# Patient Record
Sex: Female | Born: 1975 | Race: White | Hispanic: No | Smoking: Former smoker
Health system: Southern US, Community
[De-identification: ages and names within clinical notes are randomized; demographics above are authoritative.]

## PROBLEM LIST (undated history)

## (undated) ENCOUNTER — Emergency Department (HOSPITAL_COMMUNITY): Admission: EM | Payer: Medicaid Other | Source: Home / Self Care

## (undated) DIAGNOSIS — N390 Urinary tract infection, site not specified: Secondary | ICD-10-CM

## (undated) DIAGNOSIS — D219 Benign neoplasm of connective and other soft tissue, unspecified: Secondary | ICD-10-CM

## (undated) DIAGNOSIS — D649 Anemia, unspecified: Secondary | ICD-10-CM

## (undated) DIAGNOSIS — R519 Headache, unspecified: Secondary | ICD-10-CM

## (undated) DIAGNOSIS — K2950 Unspecified chronic gastritis without bleeding: Secondary | ICD-10-CM

## (undated) DIAGNOSIS — R51 Headache: Secondary | ICD-10-CM

## (undated) DIAGNOSIS — K219 Gastro-esophageal reflux disease without esophagitis: Secondary | ICD-10-CM

## (undated) DIAGNOSIS — D509 Iron deficiency anemia, unspecified: Secondary | ICD-10-CM

## (undated) DIAGNOSIS — K222 Esophageal obstruction: Secondary | ICD-10-CM

## (undated) DIAGNOSIS — N83209 Unspecified ovarian cyst, unspecified side: Secondary | ICD-10-CM

## (undated) HISTORY — DX: Benign neoplasm of connective and other soft tissue, unspecified: D21.9

## (undated) HISTORY — DX: Gastro-esophageal reflux disease without esophagitis: K21.9

## (undated) HISTORY — DX: Iron deficiency anemia, unspecified: D50.9

## (undated) HISTORY — DX: Headache: R51

## (undated) HISTORY — DX: Esophageal obstruction: K22.2

## (undated) HISTORY — DX: Unspecified chronic gastritis without bleeding: K29.50

## (undated) HISTORY — DX: Unspecified ovarian cyst, unspecified side: N83.209

## (undated) HISTORY — DX: Headache, unspecified: R51.9

## (undated) HISTORY — DX: Anemia, unspecified: D64.9

## (undated) HISTORY — DX: Urinary tract infection, site not specified: N39.0

---

## 1995-12-31 HISTORY — PX: TUBAL LIGATION: SHX77

## 2001-05-18 ENCOUNTER — Other Ambulatory Visit: Admission: RE | Admit: 2001-05-18 | Discharge: 2001-05-18 | Payer: Self-pay | Admitting: Family Medicine

## 2001-06-02 ENCOUNTER — Encounter: Payer: Self-pay | Admitting: Family Medicine

## 2001-06-02 ENCOUNTER — Ambulatory Visit (HOSPITAL_COMMUNITY): Admission: RE | Admit: 2001-06-02 | Discharge: 2001-06-02 | Payer: Self-pay | Admitting: Family Medicine

## 2001-09-07 ENCOUNTER — Encounter: Payer: Self-pay | Admitting: Family Medicine

## 2001-09-07 ENCOUNTER — Ambulatory Visit (HOSPITAL_COMMUNITY): Admission: RE | Admit: 2001-09-07 | Discharge: 2001-09-07 | Payer: Self-pay | Admitting: Family Medicine

## 2002-07-01 ENCOUNTER — Other Ambulatory Visit: Admission: RE | Admit: 2002-07-01 | Discharge: 2002-07-01 | Payer: Self-pay | Admitting: Family Medicine

## 2003-11-22 ENCOUNTER — Other Ambulatory Visit: Admission: RE | Admit: 2003-11-22 | Discharge: 2003-11-22 | Payer: Self-pay | Admitting: Family Medicine

## 2005-02-14 ENCOUNTER — Other Ambulatory Visit: Admission: RE | Admit: 2005-02-14 | Discharge: 2005-02-14 | Payer: Self-pay | Admitting: Family Medicine

## 2005-02-26 ENCOUNTER — Ambulatory Visit (HOSPITAL_COMMUNITY): Admission: RE | Admit: 2005-02-26 | Discharge: 2005-02-26 | Payer: Self-pay | Admitting: Family Medicine

## 2007-03-17 ENCOUNTER — Other Ambulatory Visit: Admission: RE | Admit: 2007-03-17 | Discharge: 2007-03-17 | Payer: Self-pay | Admitting: Family Medicine

## 2009-09-16 ENCOUNTER — Emergency Department (HOSPITAL_COMMUNITY): Admission: EM | Admit: 2009-09-16 | Discharge: 2009-09-16 | Payer: Self-pay | Admitting: Emergency Medicine

## 2010-09-04 ENCOUNTER — Emergency Department (HOSPITAL_COMMUNITY): Admission: EM | Admit: 2010-09-04 | Discharge: 2010-09-04 | Payer: Self-pay | Admitting: Emergency Medicine

## 2011-04-05 LAB — URINALYSIS, ROUTINE W REFLEX MICROSCOPIC
Nitrite: NEGATIVE
Protein, ur: 30 mg/dL — AB
pH: 5.5 (ref 5.0–8.0)

## 2011-04-05 LAB — BASIC METABOLIC PANEL
BUN: 7 mg/dL (ref 6–23)
Calcium: 9.8 mg/dL (ref 8.4–10.5)
GFR calc non Af Amer: >60 mL/min (ref >60)
Glucose, Bld: 91 mg/dL (ref 70–99)

## 2011-04-05 LAB — CBC: HCT: 36.9 % (ref 36.0–46.0)

## 2011-04-05 LAB — DIFFERENTIAL
Eosinophils Absolute: 0.1 10*3/uL (ref 0.0–0.7)
Lymphs Abs: 2 10*3/uL (ref 0.7–4.0)
Monocytes Absolute: 0.5 10*3/uL (ref 0.1–1.0)
Monocytes Relative: 7 % (ref 3–12)
Neutrophils Relative %: 66 % (ref 43–77)

## 2011-04-05 LAB — GC/CHLAMYDIA PROBE AMP, GENITAL
Chlamydia, DNA Probe: NEGATIVE
GC Probe Amp, Genital: NEGATIVE

## 2011-04-05 LAB — PREGNANCY, URINE: Preg Test, Ur: NEGATIVE

## 2011-04-05 LAB — WET PREP, GENITAL

## 2011-04-05 LAB — URINE MICROSCOPIC-ADD ON

## 2013-07-09 ENCOUNTER — Ambulatory Visit (INDEPENDENT_AMBULATORY_CARE_PROVIDER_SITE_OTHER): Payer: Medicaid Other | Admitting: Family Medicine

## 2013-07-09 ENCOUNTER — Encounter: Payer: Self-pay | Admitting: Family Medicine

## 2013-07-09 VITALS — BP 113/78 | HR 78 | Temp 97.3°F | Ht 63.0 in | Wt 117.0 lb

## 2013-07-09 DIAGNOSIS — G43909 Migraine, unspecified, not intractable, without status migrainosus: Secondary | ICD-10-CM

## 2013-07-09 MED ORDER — PROMETHAZINE HCL 25 MG/ML IJ SOLN
25.0000 mg | Freq: Once | INTRAMUSCULAR | Status: AC
  Administered 2013-07-09: 25 mg via INTRAMUSCULAR

## 2013-07-09 MED ORDER — PROMETHAZINE HCL 25 MG PO TABS: 25.0000 mg | ORAL_TABLET | Freq: Four times a day (QID) | ORAL | Status: DC | PRN

## 2013-07-09 MED ORDER — DEXAMETHASONE SODIUM PHOSPHATE 4 MG/ML IJ SOLN
8.0000 mg | Freq: Once | INTRAMUSCULAR | Status: AC
  Administered 2013-07-09: 8 mg via INTRAMUSCULAR

## 2013-07-09 MED ORDER — NORETHINDRONE-ETH ESTRADIOL 1-35 MG-MCG PO TABS: 1.0000 | ORAL_TABLET | Freq: Every day | ORAL | Status: DC

## 2013-07-09 MED ORDER — DEXAMETHASONE SODIUM PHOSPHATE 10 MG/ML IJ SOLN: 10.0000 mg | Freq: Once | INTRAMUSCULAR | Status: DC
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
## 2013-07-09 NOTE — Patient Instructions (Addendum)
Promethazine injection What is this medicine? PROMETHAZINE (proe METH a zeen) is an antihistamine. It is used to treat allergic reactions and to treat or prevent nausea and vomiting from illness or motion sickness. It is also used to make you sleep before surgery, and to help treat pain or nausea after surgery. This medicine may be used for other purposes; ask your health care provider or pharmacist if you have questions. What should I tell my health care provider before I take this medicine? They need to know if you have any of these conditions: -glaucoma -high blood pressure or heart disease -kidney disease -liver disease -lung or breathing disease, like asthma -prostate trouble -pain or difficulty passing urine -seizures -an unusual or allergic reaction to promethazine or phenothiazines, other medicines, foods, dyes, or preservatives -pregnant or trying to get pregnant -breast-feeding How should I use this medicine? This medicine is for injection into a muscle, or into a vein. It is given by a health care professional in a hospital or clinic setting. Talk to your pediatrician regarding the use of this medicine in children. This medicine should not be given to infants and children younger than 67 years old. Overdosage: If you think you have taken too much of this medicine contact a poison control center or emergency room at once. NOTE: This medicine is only for you. Do not share this medicine with others. What if I miss a dose? This does not apply. What may interact with this medicine? Do not take this medicine with any of the following medications: -medicines called MAO Inhibitors like Nardil, Parnate, Marplan, Eldepryl -other phenothiazines like trimethobenzamide This medicine may also interact with the following medications: -barbiturates like phenobarbital -bromocriptine -certain antidepressants -certain antihistamines used in allergy or cold  medicines -epinephrine -levodopa -medicines for sleep -medicines for mental problems and psychotic disturbances -medicines for movement abnormalities as in Parkinson's disease, or for gastrointestinal problems -muscle relaxants -prescription pain medicines This list may not describe all possible interactions. Give your health care provider a list of all the medicines, herbs, non-prescription drugs, or dietary supplements you use. Also tell them if you smoke, drink alcohol, or use illegal drugs. Some items may interact with your medicine. What should I watch for while using this medicine? Your condition will be monitored carefully while you are receiving this medicine. Your healthcare professional will discuss with you the risks and the benefits of using this medicine. This medicine has caused serious side effects in some patients after it was injected into a vein. Watch closely for any signs or symptoms of a local reaction like burning, pain, redness, swelling, and blistering and tell your healthcare professional immediately if any occur. These symptoms may occur when you receive the injection or may occur hours or even days after the injection. You may get drowsy or dizzy. Do not drive, use machinery, or do anything that needs mental alertness until you know how this medicine affects you. To reduce the risk of dizzy or fainting spells, do not stand or sit up quickly, especially if you are an older patient. Alcohol may increase dizziness and drowsiness. Avoid alcoholic drinks. Your mouth may get dry. Chewing sugarless gum or sucking hard candy, and drinking plenty of water will help. This medicine may cause dry eyes and blurred vision. If you wear contact lenses you may feel some discomfort. Lubricating drops may help. See your eye doctor if the problem does not go away or is severe. Keep out of the sun, or wear protective clothing  outdoors and use a sunscreen. Do not use sun lamps or sun tanning beds  or booths. If you are diabetic, check your blood-sugar levels regularly. What side effects may I notice from receiving this medicine? Side effects that you should report to your doctor or health care professional as soon as possible: -allergic reactions like skin rash, itching or hives, swelling of the face, lips, or tongue -blurred vision -burning, blistering, pain, redness, and/or swelling at the injection site -irregular heartbeat, palpitations or chest pain -muscle or facial twitches -pain or difficulty passing urine -seizures -slowed or shallow breathing -unusual bleeding or bruising -yellowing of the eyes or skin Side effects that usually do not require medical attention (report to your doctor or health care professional if they continue or are bothersome): -headache -nightmares, agitation, nervousness, excitability, not able to sleep (these are more likely in children) -stuffy nose This list may not describe all possible side effects. Call your doctor for medical advice about side effects. You may report side effects to FDA at 1-800-FDA-1088. Where should I keep my medicine? This drug is given in a hospital or clinic and will not be stored at home. NOTE: This sheet is a summary. It may not cover all possible information. If you have questions about this medicine, talk to your doctor, pharmacist, or health care provider.  2013, Elsevier/Gold Standard. (09/14/2008 4:23:31 PM)   Migraine Headache A migraine headache is an intense, throbbing pain on one or both sides of your head. A migraine can last for 30 minutes to several hours. CAUSES  The exact cause of a migraine headache is not always known. However, a migraine may be caused when nerves in the brain become irritated and release chemicals that cause inflammation. This causes pain. SYMPTOMS  Pain on one or both sides of your head.  Pulsating or throbbing pain.  Severe pain that prevents daily activities.  Pain that is  aggravated by any physical activity.  Nausea, vomiting, or both.  Dizziness.  Pain with exposure to bright lights, loud noises, or activity.  General sensitivity to bright lights, loud noises, or smells. Before you get a migraine, you may get warning signs that a migraine is coming (aura). An aura may include:  Seeing flashing lights.  Seeing bright spots, halos, or zig-zag lines.  Having tunnel vision or blurred vision.  Having feelings of numbness or tingling.  Having trouble talking.  Having muscle weakness. MIGRAINE TRIGGERS  Alcohol.  Smoking.  Stress.  Menstruation.  Aged cheeses.  Foods or drinks that contain nitrates, glutamate, aspartame, or tyramine.  Lack of sleep.  Chocolate.  Caffeine.  Hunger.  Physical exertion.  Fatigue.  Medicines used to treat chest pain (nitroglycerine), birth control pills, estrogen, and some blood pressure medicines. DIAGNOSIS  A migraine headache is often diagnosed based on:  Symptoms.  Physical examination.  A CT scan or MRI of your head. TREATMENT Medicines may be given for pain and nausea. Medicines can also be given to help prevent recurrent migraines.  HOME CARE INSTRUCTIONS  Only take over-the-counter or prescription medicines for pain or discomfort as directed by your caregiver. The use of long-term narcotics is not recommended.  Lie down in a dark, quiet room when you have a migraine.  Keep a journal to find out what may trigger your migraine headaches. For example, write down:  What you eat and drink.  How much sleep you get.  Any change to your diet or medicines.  Limit alcohol consumption.  Quit smoking if  you smoke.  Get 7 to 9 hours of sleep, or as recommended by your caregiver.  Limit stress.  Keep lights dim if bright lights bother you and make your migraines worse. SEEK IMMEDIATE MEDICAL CARE IF:   Your migraine becomes severe.  You have a fever.  You have a stiff  neck.  You have vision loss.  You have muscular weakness or loss of muscle control.  You start losing your balance or have trouble walking.  You feel faint or pass out.  You have severe symptoms that are different from your first symptoms. MAKE SURE YOU:   Understand these instructions.  Will watch your condition.  Will get help right away if you are not doing well or get worse. Document Released: 12/16/2005 Document Revised: 03/09/2012 Document Reviewed: 12/06/2011 [facility] Patient Information 2014 [place], [place].

## 2013-12-06 ENCOUNTER — Encounter: Payer: Self-pay | Admitting: Family Medicine

## 2013-12-06 ENCOUNTER — Ambulatory Visit (INDEPENDENT_AMBULATORY_CARE_PROVIDER_SITE_OTHER): Payer: Medicaid Other | Admitting: Family Medicine

## 2013-12-06 VITALS — BP 114/80 | HR 79 | Temp 98.5°F | Ht 63.0 in | Wt 122.0 lb

## 2013-12-06 DIAGNOSIS — G43909 Migraine, unspecified, not intractable, without status migrainosus: Secondary | ICD-10-CM

## 2013-12-06 DIAGNOSIS — R51 Headache: Secondary | ICD-10-CM

## 2013-12-06 MED ORDER — SUMATRIPTAN SUCCINATE 25 MG PO TABS: 25.0000 mg | ORAL_TABLET | ORAL | Status: DC | PRN

## 2013-12-06 MED ORDER — NORETHINDRONE-ETH ESTRADIOL 1-35 MG-MCG PO TABS: 1.0000 | ORAL_TABLET | Freq: Every day | ORAL | Status: DC

## 2013-12-06 MED ORDER — METHYLPREDNISOLONE ACETATE 80 MG/ML IJ SUSP: 80.0000 mg | Freq: Once | INTRAMUSCULAR | Status: DC

## 2013-12-06 MED ORDER — PROMETHAZINE HCL 25 MG/ML IJ SOLN
25.0000 mg | Freq: Once | INTRAMUSCULAR | Status: AC
  Administered 2013-12-06: 25 mg via INTRAMUSCULAR

## 2013-12-06 MED ORDER — DEXAMETHASONE SODIUM PHOSPHATE 4 MG/ML IJ SOLN
8.0000 mg | Freq: Once | INTRAMUSCULAR | Status: AC
  Administered 2013-12-06: 8 mg via INTRAMUSCULAR
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
## 2013-12-06 NOTE — Addendum Note (Signed)
Addended by: Gwenith Daily on: 12/06/2013 02:16 PM   Modules accepted: Orders

## 2013-12-16 ENCOUNTER — Telehealth: Payer: Self-pay | Admitting: Family Medicine

## 2013-12-21 NOTE — Telephone Encounter (Signed)
Continued headache. Results of CT are not in the EMR. Requested report from [facility]. Appt scheduled for f/u with Dr. Alvester Morin tomorrow.

## 2013-12-22 ENCOUNTER — Encounter: Payer: Self-pay | Admitting: Family Medicine

## 2013-12-22 ENCOUNTER — Ambulatory Visit (INDEPENDENT_AMBULATORY_CARE_PROVIDER_SITE_OTHER): Payer: Medicaid Other | Admitting: Family Medicine

## 2013-12-22 VITALS — BP 112/76 | HR 97 | Temp 97.3°F | Ht 63.0 in | Wt 122.4 lb

## 2013-12-22 DIAGNOSIS — G43909 Migraine, unspecified, not intractable, without status migrainosus: Secondary | ICD-10-CM

## 2013-12-22 NOTE — Progress Notes (Signed)
SUBJECTIVE: Laurie Howell is a 37 y.o. female who complains of headaches for several days has been for similar headache flares in the past. Most recently about 2 months ago. Headache seems to be much more intense in comparison to previous. Was given IM decadron and phenergan with last migrainous flare. Pt states that this was minimally effective. Description of pain: throbbing pain, sharp pain, global, bilateral in the frontal area. Duration of individual headaches: hours   frequency daily. Associated symptoms: aura, dizziness, light sensitivity and nausea. Pain relief: unable to obtain relief with OTC meds. Precipitating factors: stress. She denies a history of recent head injury.  Prior neurological history: negative for migraine headaches. Note: had head CT w/in last 30 days that was WNL.  Neurologic Review of Systems - no TIA or stroke-like symptoms.  Scheduled Meds: Continuous Infusions: PRN Meds:     OBJECTIVE: Appearance: in mild distress 2/2 pain . HEENT: NCAT, EOMI, TMs clear bilaterally CV: RRR, no murmurs auscultated PULM: CTAB, no wheezes, rales, rhoncii ABD: S/NT/+ bowel sounds  EXT: 2+ peripheral pulses Neurological Exam: alert, oriented, normal speech, no focal findings or movement disorder noted, + photophobia bilaterally. Significant. .  ASSESSMENT/PLAN: Migraines - Plan: Ambulatory referral to Neurology   Had a relatively lengthy discussion with pt.  Both the pt and I feel she would benefit from ER evaluation of HA as she may need IV migraine cocktail with observation +/- admission. Intensity of HA seems to be worse than previous.  Recent neuroimaging WNL.  Pt overall clinicall stable. No focla neurological abnormalities on exam.  Feels like she can go to ER under her own power. Has a driver.  Still needs neuro follow up.  Consider start pt on migraine ppx medication ( topamax, amitriptyline, propanolol) pending follow up.  Follow up as needed.

## 2014-01-03 ENCOUNTER — Ambulatory Visit (INDEPENDENT_AMBULATORY_CARE_PROVIDER_SITE_OTHER): Payer: Medicaid Other | Admitting: Family Medicine

## 2014-01-03 VITALS — BP 112/70 | HR 94 | Temp 97.7°F | Ht 63.0 in | Wt 121.0 lb

## 2014-01-03 DIAGNOSIS — G43909 Migraine, unspecified, not intractable, without status migrainosus: Secondary | ICD-10-CM

## 2014-01-03 MED ORDER — TOPIRAMATE 50 MG PO TABS: 50.0000 mg | ORAL_TABLET | Freq: Every day | ORAL | Status: DC

## 2014-01-03 NOTE — Progress Notes (Signed)
Pt not seen by MD  Called in topamax for migraine ppx as previously discussed at last visit (12/24) Neuro referral also placed ( also discussed at last visit).

## 2014-01-13 ENCOUNTER — Encounter (INDEPENDENT_AMBULATORY_CARE_PROVIDER_SITE_OTHER): Payer: Self-pay

## 2014-01-13 ENCOUNTER — Ambulatory Visit (INDEPENDENT_AMBULATORY_CARE_PROVIDER_SITE_OTHER): Payer: Medicaid Other | Admitting: Neurology

## 2014-01-13 ENCOUNTER — Encounter: Payer: Self-pay | Admitting: Neurology

## 2014-01-13 VITALS — BP 127/82 | HR 88 | Ht 64.0 in | Wt 120.0 lb

## 2014-01-13 DIAGNOSIS — G4440 Drug-induced headache, not elsewhere classified, not intractable: Secondary | ICD-10-CM

## 2014-01-13 DIAGNOSIS — T3995XA Adverse effect of unspecified nonopioid analgesic, antipyretic and antirheumatic, initial encounter: Secondary | ICD-10-CM

## 2014-01-13 DIAGNOSIS — R519 Headache, unspecified: Secondary | ICD-10-CM

## 2014-01-13 DIAGNOSIS — R51 Headache: Secondary | ICD-10-CM

## 2014-01-13 DIAGNOSIS — G43019 Migraine without aura, intractable, without status migrainosus: Secondary | ICD-10-CM

## 2014-01-13 DIAGNOSIS — G43909 Migraine, unspecified, not intractable, without status migrainosus: Secondary | ICD-10-CM

## 2014-01-13 MED ORDER — ELETRIPTAN HYDROBROMIDE 40 MG PO TABS: 40.0000 mg | ORAL_TABLET | ORAL | Status: DC | PRN

## 2014-01-13 MED ORDER — TOPIRAMATE 50 MG PO TABS: 50.0000 mg | ORAL_TABLET | Freq: Two times a day (BID) | ORAL | Status: DC

## 2014-01-13 NOTE — Patient Instructions (Addendum)
I had a long discussion the patient with regards to her migraine headaches progressively getting worse and discuss tenderness rebound and advise her to discontinue Tylenol and/or over-the-counter analgesics. I gave her samples of Relpax and advise her to take 40 mg tablet for symptomatic relief and may repeat a second dose in 2 hours if suboptimal relief but not more than 2 tablets a day and not more than 2 days per week. Increase Topamax to 50 mg twice daily. I encouraged her to participate in daily activities for stress laxation like regular exercise, swimming, meditation and yoga. She was also advised to do that daily next exercises. She was also advised to discuss with her gynecologist switching her birth control pill to one with minimum to no estrogen. Check MRI scan of the brain. Keep a headache diary. Return for followup in 2 months with Laurie Hawking, NP for call earlier if necessary.

## 2014-01-14 DIAGNOSIS — G4440 Drug-induced headache, not elsewhere classified, not intractable: Secondary | ICD-10-CM | POA: Insufficient documentation

## 2014-01-14 DIAGNOSIS — T3995XA Adverse effect of unspecified nonopioid analgesic, antipyretic and antirheumatic, initial encounter: Secondary | ICD-10-CM

## 2014-01-14 DIAGNOSIS — R519 Headache, unspecified: Secondary | ICD-10-CM | POA: Insufficient documentation

## 2014-01-14 DIAGNOSIS — G43019 Migraine without aura, intractable, without status migrainosus: Secondary | ICD-10-CM | POA: Insufficient documentation

## 2014-01-14 DIAGNOSIS — R51 Headache: Secondary | ICD-10-CM

## 2014-01-14 NOTE — Progress Notes (Signed)
[facility] [address] [place]. [place] [address] [phone]       OFFICE CONSULT NOTE  [name] Date of Birth:  14-Mar-1976 Medical Record Number:  [id number]   Referring MD:  Dr Laurance Flatten  Reason for Referral:  headaches  HPI: 5 year Caucasian lady with migraine headaches since last 4 years which have been gradually worsening in frequency and severity to now daily  Headaches for last 6 months.She describes the headaches as holocephalic, severe 41/32 seviety, stabbing accompanied by nausea, frequent vomitting, light and sound sensitivity, dizziness, vision difficulties and worsened with physical activity, bending down and relieved by sleep, lying down.Headaches are often present upon awakening and may last all day and she has trouble sleeping on most nights. She denies any focal neurological symptom with her headaches, she also has muscle tightnessi n her neck with her headaches and posterior neck pain. She does not drink excessive caffeine. She takes 4-6 tylenol tablets daily for symptomatic relief.advil and goody powders do not help. She has not tried any triptans or migraine specific medicines. She has been on topamax 50 mg daily for several months without benefit.She was sen in [facility] 12/22/13 and treated withh injectable pain medicines with partial relief only. She has not had brain MRI. CT she has family h/o migraines in her mother. head 12/09/13 personally reviewed was normal.  ROS:   14 system review of systems is positive for trouble swallowing, blurred vision,double vision,eye pain,easy bruising, headache, weakness,difficulty swallowing, ,dizziness and all other systems negative  PMH:  Past Medical History  Diagnosis Date  . Esophageal stricture   . Headache   . Anemia     Social History:  History   Social History  . Marital Status: Legally Separated    Spouse Name: N/A    Number of Children: 2  . Years of Education: 8th   Occupational  History  . N/A    Social History Main Topics  . Smoking status: Former Smoker    Types: Cigarettes    Quit date: 12/30/1994  . Smokeless tobacco: Never Used  . Alcohol Use: No  . Drug Use: No  . Sexual Activity: Not on file   Other Topics Concern  . Not on file   Social History Narrative  . No narrative on file    Medications:   Current Outpatient Prescriptions on File Prior to Visit  Medication Sig Dispense Refill  . diphenhydrAMINE (BENADRYL) 50 MG tablet Take 50 mg by mouth 2 (two) times daily.      . metoCLOPramide (REGLAN) 10 MG tablet Take 10 mg by mouth 2 (two) times daily.      . norethindrone-ethinyl estradiol 1/35 (ALAYCEN 1/35) tablet Take 1 tablet by mouth daily.  1 Package  2   No current facility-administered medications on file prior to visit.    Allergies:   Allergies  Allergen Reactions  . Aspirin Swelling  . Ivp Dye [Iodinated Diagnostic Agents]     Physical Exam General: well developed, well nourished, seated, in no evident distress Head: head normocephalic and atraumatic. Orohparynx benign Neck: supple with no carotid or supraclavicular bruits Cardiovascular: regular rate and rhythm, no murmurs Musculoskeletal: no deformity Skin:  no rash/petichiae Vascular:  Normal pulses all extremities Filed Vitals:   01/13/14 1259  BP: 127/82  Pulse: 88    Neurologic Exam Mental Status: Awake and fully alert. Oriented to place and time. Recent and remote memory intact. Attention span, concentration and fund of knowledge  appropriate. Mood and affect appropriate.  Cranial Nerves: Fundoscopic exam reveals sharp disc margins. Pupils equal, briskly reactive to light. Extraocular movements full without nystagmus. Visual fields full to confrontation. Hearing intact. Facial sensation intact. Face, tongue, palate moves normally and symmetrically.  Motor: Normal bulk and tone. Normal strength in all tested extremity muscles. Sensory.: intact to tough and pinprick  and vibratory.  Coordination: Rapid alternating movements normal in all extremities. Finger-to-nose and heel-to-shin performed accurately bilaterally. Gait and Station: Arises from chair without difficulty. Stance is normal. Gait demonstrates normal stride length and balance . Able to heel, toe and tandem walk without difficulty.  Reflexes: 1+ and symmetric. Toes downgoing.       ASSESSMENT: 28 year Caucasian lady transformed migraine headaches with analgesic rebound    PLAN:I had a long discussion the patient with regards to her migraine headaches progressively getting worse and discuss tenderness rebound and advise her to discontinue Tylenol and/or over-the-counter analgesics. I gave her samples of Relpax and advise her to take 40 mg tablet for symptomatic relief and may repeat a second dose in 2 hours if suboptimal relief but not more than 2 tablets a day and not more than 2 days per week. Increase Topamax to 50 mg twice daily. I encouraged her to participate in daily activities for stress relaxation like regular exercise, swimming, meditation and yoga. She was also advised to do that daily next exercises. She was also advised to discuss with her gynecologist switching her birth control pill to one with minimum to no estrogen. Check MRI scan of the brain. Keep a headache diary. Return for followup in 2 months with Jeani Hawking, NP for call earlier if necessary.

## 2014-01-26 ENCOUNTER — Inpatient Hospital Stay: Admission: RE | Admit: 2014-01-26 | Payer: Self-pay | Source: Ambulatory Visit

## 2014-02-03 ENCOUNTER — Other Ambulatory Visit: Payer: Self-pay | Admitting: Family Medicine

## 2014-02-04 ENCOUNTER — Inpatient Hospital Stay: Admission: RE | Admit: 2014-02-04 | Payer: Self-pay | Source: Ambulatory Visit

## 2014-02-07 NOTE — Telephone Encounter (Signed)
Seen 01/03/14  Dr Ernestina Patches

## 2014-03-16 ENCOUNTER — Ambulatory Visit: Payer: Medicaid Other | Admitting: Nurse Practitioner

## 2014-04-13 ENCOUNTER — Other Ambulatory Visit: Payer: Self-pay | Admitting: Family Medicine

## 2014-06-19 ENCOUNTER — Other Ambulatory Visit: Payer: Self-pay | Admitting: Family Medicine

## 2014-07-11 ENCOUNTER — Other Ambulatory Visit: Payer: Self-pay | Admitting: Family Medicine

## 2014-07-11 NOTE — Telephone Encounter (Signed)
Patient was notified at last refill that NTBS. Please advise

## 2014-07-11 NOTE — Telephone Encounter (Signed)
No more refills without being seen 

## 2014-08-02 ENCOUNTER — Other Ambulatory Visit: Payer: Self-pay | Admitting: Family Medicine

## 2014-10-06 ENCOUNTER — Other Ambulatory Visit: Payer: Self-pay | Admitting: Nurse Practitioner

## 2014-10-07 ENCOUNTER — Telehealth: Payer: Self-pay | Admitting: Family Medicine

## 2014-10-07 MED ORDER — NORETHINDRONE-ETH ESTRADIOL 1-35 MG-MCG PO TABS: ORAL_TABLET | ORAL | Status: DC

## 2014-10-07 NOTE — Telephone Encounter (Signed)
Patient notified of RX sent to pharmacy and informed her that she would need to be seen for pap to continue RX.

## 2014-10-07 NOTE — Telephone Encounter (Signed)
I sent the Rx to the pharmacy.- ntbs no pap since 2013 Discussed with patient

## 2015-02-05 ENCOUNTER — Encounter (HOSPITAL_COMMUNITY): Payer: Self-pay | Admitting: *Deleted

## 2015-02-05 ENCOUNTER — Emergency Department (HOSPITAL_COMMUNITY)
Admission: EM | Admit: 2015-02-05 | Discharge: 2015-02-06 | Disposition: A | Discharge status: Home / Self Care | Payer: Medicaid Other | Attending: Emergency Medicine | Admitting: Emergency Medicine

## 2015-02-05 DIAGNOSIS — Z793 Long term (current) use of hormonal contraceptives: Secondary | ICD-10-CM | POA: Diagnosis not present

## 2015-02-05 DIAGNOSIS — Z8619 Personal history of other infectious and parasitic diseases: Secondary | ICD-10-CM | POA: Diagnosis not present

## 2015-02-05 DIAGNOSIS — Z872 Personal history of diseases of the skin and subcutaneous tissue: Secondary | ICD-10-CM | POA: Diagnosis not present

## 2015-02-05 DIAGNOSIS — R112 Nausea with vomiting, unspecified: Secondary | ICD-10-CM | POA: Insufficient documentation

## 2015-02-05 DIAGNOSIS — Z789 Other specified health status: Secondary | ICD-10-CM

## 2015-02-05 DIAGNOSIS — Z87891 Personal history of nicotine dependence: Secondary | ICD-10-CM | POA: Diagnosis not present

## 2015-02-05 DIAGNOSIS — Z79899 Other long term (current) drug therapy: Secondary | ICD-10-CM | POA: Diagnosis not present

## 2015-02-05 DIAGNOSIS — F1010 Alcohol abuse, uncomplicated: Secondary | ICD-10-CM | POA: Insufficient documentation

## 2015-02-05 DIAGNOSIS — Z8719 Personal history of other diseases of the digestive system: Secondary | ICD-10-CM | POA: Insufficient documentation

## 2015-02-05 NOTE — ED Notes (Signed)
Pt arrived by EMS from home. Reported pt has been drinking & got sick just as EMS arrived. Pt got zofran by EMS. Pt complaining of being cold.

## 2015-02-06 LAB — RAPID URINE DRUG SCREEN, HOSP PERFORMED
Amphetamines: NOT DETECTED
Barbiturates: NOT DETECTED
Benzodiazepines: NOT DETECTED
Cocaine: NOT DETECTED
OPIATES: NOT DETECTED
TETRAHYDROCANNABINOL: NOT DETECTED

## 2015-02-06 LAB — URINALYSIS, ROUTINE W REFLEX MICROSCOPIC
Bilirubin Urine: NEGATIVE
Glucose, UA: NEGATIVE mg/dL
Ketones, ur: NEGATIVE mg/dL
Nitrite: NEGATIVE
Protein, ur: NEGATIVE mg/dL
UROBILINOGEN UA: 0.2 mg/dL (ref 0.0–1.0)
pH: 6.5 (ref 5.0–8.0)

## 2015-02-06 LAB — CBC WITH DIFFERENTIAL/PLATELET
BASOS ABS: 0 10*3/uL (ref 0.0–0.1)
Basophils Relative: 1 % (ref 0–1)
EOS ABS: 0.1 10*3/uL (ref 0.0–0.7)
Eosinophils Relative: 2 % (ref 0–5)
HEMATOCRIT: 35 % — AB (ref 36.0–46.0)
Hemoglobin: 11.6 g/dL — ABNORMAL LOW (ref 12.0–15.0)
LYMPHS ABS: 2.7 10*3/uL (ref 0.7–4.0)
Lymphocytes Relative: 34 % (ref 12–46)
MCH: 30.2 pg (ref 26.0–34.0)
MCHC: 33.1 g/dL (ref 30.0–36.0)
MCV: 91.1 fL (ref 78.0–100.0)
MONO ABS: 0.7 10*3/uL (ref 0.1–1.0)
MONOS PCT: 9 % (ref 3–12)
NEUTROS PCT: 54 % (ref 43–77)
Neutro Abs: 4.3 10*3/uL (ref 1.7–7.7)
PLATELETS: 262 10*3/uL (ref 150–400)
RBC: 3.84 MIL/uL — ABNORMAL LOW (ref 3.87–5.11)
RDW: 12.5 % (ref 11.5–15.5)
WBC: 7.9 10*3/uL (ref 4.0–10.5)

## 2015-02-06 LAB — BASIC METABOLIC PANEL
Anion gap: 8 (ref 5–15)
BUN: 10 mg/dL (ref 6–23)
CHLORIDE: 119 mmol/L — AB (ref 96–112)
CO2: 18 mmol/L — ABNORMAL LOW (ref 19–32)
Calcium: 8.2 mg/dL — ABNORMAL LOW (ref 8.4–10.5)
Creatinine, Ser: 0.55 mg/dL (ref 0.50–1.10)
GFR calc Af Amer: >90 mL/min (ref >90)
Glucose, Bld: 102 mg/dL — ABNORMAL HIGH (ref 70–99)
POTASSIUM: 3.2 mmol/L — AB (ref 3.5–5.1)
Sodium: 145 mmol/L (ref 135–145)

## 2015-02-06 LAB — PROTIME-INR
INR: 1.17 (ref 0.00–1.49)
PROTHROMBIN TIME: 15 s (ref 11.6–15.2)

## 2015-02-06 LAB — URINE MICROSCOPIC-ADD ON

## 2015-02-06 LAB — ETHANOL: ALCOHOL ETHYL (B): 171 mg/dL — AB (ref 0–9)

## 2015-02-06 MED ORDER — ONDANSETRON HCL 4 MG/2ML IJ SOLN
4.0000 mg | Freq: Once | INTRAMUSCULAR | Status: AC
  Administered 2015-02-06: 4 mg via INTRAVENOUS

## 2015-02-06 MED ORDER — POTASSIUM CHLORIDE 10 MEQ/100ML IV SOLN
10.0000 meq | Freq: Once | INTRAVENOUS | Status: AC
  Administered 2015-02-06: 10 meq via INTRAVENOUS
  Filled 2015-02-06: qty 100

## 2015-02-06 MED ORDER — SODIUM CHLORIDE 0.9 % IV BOLUS (SEPSIS)
1000.0000 mL | Freq: Once | INTRAVENOUS | Status: AC
  Administered 2015-02-06: 1000 mL via INTRAVENOUS

## 2015-02-06 MED ORDER — DIAZEPAM 5 MG PO TABS
ORAL_TABLET | ORAL | Status: AC
  Filled 2015-02-06: qty 1

## 2015-02-06 MED ORDER — ONDANSETRON HCL 4 MG/2ML IJ SOLN
INTRAMUSCULAR | Status: AC
  Filled 2015-02-06: qty 2

## 2015-02-06 NOTE — ED Notes (Signed)
Discharge instructions given, pt demonstrated teach back and verbal understanding. No concerns voiced.  

## 2015-02-06 NOTE — ED Notes (Signed)
Patient ambulatory to restroom with spouse at this time

## 2015-02-06 NOTE — ED Notes (Addendum)
Pt c/o pain and burning at IV site from K+ infusion. Checked for patency, +blood return and flushed well. K+ rate decreased to 51ml per hour with ns IVF infusion flowing with K+. EDP notifed.

## 2015-02-06 NOTE — Discharge Instructions (Signed)

## 2015-02-09 NOTE — ED Provider Notes (Signed)
CSN: [id number]     Arrival date & time 02/05/15  2355 History   First MD Initiated Contact with Patient 02/06/15 0000     Chief Complaint  Patient presents with  . Emesis     (Consider location/radiation/quality/duration/timing/severity/associated sxs/prior Treatment) HPI   38yf with etoh during the Super Bowl. Husband is at bedside. He states that patient was drinking heavily. "By the second quarter she had to keep asking for the score because she couldn't make it out on the TV anymore." Increasingly drowsy from there. Vomited several times. He was present with patient. No trauma. She was in usual state of health earlierin day.Patient with no complaints aside from feeling nauseated and cold.  Past Medical History  Diagnosis Date  . Esophageal stricture   . Headache   . Anemia    Past Surgical History  Procedure Laterality Date  . Tubal ligation     Family History  Problem Relation Age of Onset  . Depression Mother   . Hypertension Mother   . Heart disease Father   . Hypertension Father   . Anxiety disorder Father   . Heart attack Father    History  Substance Use Topics  . Smoking status: Former Smoker    Types: Cigarettes    Quit date: 12/30/1994  . Smokeless tobacco: Never Used  . Alcohol Use: Yes   OB History    No data available     Review of Systems  All systems reviewed and negative, other than as noted in HPI.   Allergies  Aspirin and Ivp dye  Home Medications   Prior to Admission medications   Medication Sig Start Date End Date Taking? Authorizing Provider  diphenhydrAMINE (BENADRYL) 50 MG tablet Take 50 mg by mouth 2 (two) times daily.    Historical Provider, MD  eletriptan (RELPAX) 40 MG tablet Take 1 tablet (40 mg total) by mouth as needed for migraine or headache. One tablet by mouth at onset of headache. May repeat in 2 hours if headache persists or recurs. 01/13/14   Antony Contras, MD  metoCLOPramide (REGLAN) 10 MG tablet Take 10 mg by mouth 2  (two) times daily.    Historical Provider, MD  norethindrone-ethinyl estradiol 1/35 (ALAYCEN 1/35) tablet TAKE 1 TABLET BY MOUTH DAILY. 10/07/14   Mary-Margaret Hassell Done, FNP  topiramate (TOPAMAX) 50 MG tablet Take 1 tablet (50 mg total) by mouth 2 (two) times daily. 01/13/14   Antony Contras, MD   BP 142/128 mmHg  Pulse 106  Temp(Src) 97.6 F (36.4 C) (Oral)  Resp 28  Ht 5\' 2"  (1.575 m)  Wt 98 lb (44.453 kg)  BMI 17.92 kg/m2  SpO2 100% Physical Exam  Constitutional: She appears well-developed and well-nourished.  HENT:  Head: Normocephalic and atraumatic.  Eyes: Pupils are equal, round, and reactive to light. Right eye exhibits no discharge. Left eye exhibits no discharge.  Conjunctival injection.   Neck: Neck supple.  Cardiovascular: Normal rate, regular rhythm and normal heart sounds.  Exam reveals no gallop and no friction rub.   No murmur heard. Pulmonary/Chest: Effort normal and breath sounds normal. No respiratory distress.  Abdominal: Soft. She exhibits no distension. There is no tenderness.  Musculoskeletal: She exhibits no edema or tenderness.  Neurological:  Very drowsy. Opens eyes/responds too voice. Follows commands. no focal motor deficit.  Skin: Skin is warm and dry.  Psychiatric: She has a normal mood and affect. Her behavior is normal. Thought content normal.  Nursing note and vitals reviewed.  ED Course  Procedures (including critical care time) Labs Review Labs Reviewed  CBC WITH DIFFERENTIAL/PLATELET - Abnormal; Notable for the following:    RBC 3.84 (*)    Hemoglobin 11.6 (*)    HCT 35.0 (*)    All other components within normal limits  BASIC METABOLIC PANEL - Abnormal; Notable for the following:    Potassium 3.2 (*)    Chloride 119 (*)    CO2 18 (*)    Glucose, Bld 102 (*)    Calcium 8.2 (*)    All other components within normal limits  ETHANOL - Abnormal; Notable for the following:    Alcohol, Ethyl (B) 171 (*)    All other components within normal  limits  URINALYSIS, ROUTINE W REFLEX MICROSCOPIC - Abnormal; Notable for the following:    Specific Gravity, Urine <1.005 (*)    Hgb urine dipstick SMALL (*)    Leukocytes, UA SMALL (*)    All other components within normal limits  URINE MICROSCOPIC-ADD ON - Abnormal; Notable for the following:    Bacteria, UA FEW (*)    All other components within normal limits  PROTIME-INR  URINE RAPID DRUG SCREEN (HOSP PERFORMED)    Imaging Review No results found.   EKG Interpretation None      MDM   Final diagnoses:  Alcohol ingestion  Non-intractable vomiting with nausea, vomiting of unspecified type    38yF with heavy drinking during superbowl and subsequently vomiting/drowsy. No reported trauma. Exam/history consistent with acute etoh ingestion. Feeling better at this point. Husband feels comfortable with taking her home.I feel she is stale for DC.     Virgel Manifold, MD 02/09/15 1248

## 2015-02-28 HISTORY — PX: OTHER SURGICAL HISTORY: SHX169

## 2015-05-01 ENCOUNTER — Ambulatory Visit (INDEPENDENT_AMBULATORY_CARE_PROVIDER_SITE_OTHER): Payer: Medicaid Other | Admitting: Obstetrics & Gynecology

## 2015-05-01 ENCOUNTER — Encounter: Payer: Self-pay | Admitting: Obstetrics & Gynecology

## 2015-05-01 VITALS — BP 107/75 | HR 74 | Resp 16 | Ht 62.0 in | Wt 115.0 lb

## 2015-05-01 DIAGNOSIS — N971 Female infertility of tubal origin: Secondary | ICD-10-CM | POA: Diagnosis not present

## 2015-05-01 HISTORY — DX: Female infertility of tubal origin: N97.1

## 2015-05-01 NOTE — Progress Notes (Signed)
Patient ID: Laurie Howell, female   DOB: Nov 19, 1976, 39 y.o.   MRN: [id number]  Chief Complaint  Patient presents with  . Gynecologic Exam    Pt had tubal reversal 02/2014 and has been using ovulation kits at home showing no ovulation.     HPI Laurie Howell is a 39 y.o. female.  [id number] Patient's last menstrual period was 04/19/2015 (approximate). S/p tubal reanastomosis  [facility]/[place] [facility] Ovulation indicator negative, Patient's last menstrual period was 04/19/2015 (approximate). Regular menses   Past Medical History  Diagnosis Date  . Esophageal stricture   . Headache   . Anemia   . Frequent UTI     Past Surgical History  Procedure Laterality Date  . Tubal ligation  1997  . Tubal reversal  02/2015    [facility] - Dr Karie Kirks    Family History  Problem Relation Age of Onset  . Depression Mother   . Hypertension Mother   . Heart disease Father   . Hypertension Father   . Anxiety disorder Father   . Heart attack Father     Social History History  Substance Use Topics  . Smoking status: Former Smoker    Types: Cigarettes    Quit date: 12/30/1994  . Smokeless tobacco: Never Used  . Alcohol Use: No    Allergies  Allergen Reactions  . Aspirin Swelling  . Ivp Dye [Iodinated Diagnostic Agents]     No current outpatient prescriptions on file.   No current facility-administered medications for this visit.    Review of Systems Review of Systems  Constitutional: Negative.   Genitourinary: Negative.   Psychiatric/Behavioral: Negative.     Blood pressure 107/75, pulse 74, resp. rate 16, height 5\' 2"  (1.575 m), weight 52.164 kg (115 lb), last menstrual period 04/19/2015.  Physical Exam Physical Exam  Constitutional: She is oriented to person, place, and time. She appears well-developed. No distress.  Pulmonary/Chest: Effort normal.  Neurological: She is alert and oriented to person, place, and time.  Psychiatric: She has a normal mood and affect. Her behavior  is normal.    Data Reviewed   Assessment    S/p tubal reversal surgery seeking infertility advice     Plan    Recommend basal body temperature recording, time intercourse, notify Dr Karie Kirks if unable to conceive 6 months        ARNOLD,JAMES 05/01/2015, 9:36 AM

## 2015-05-01 NOTE — Patient Instructions (Signed)
Infertility WHAT IS INFERTILITY?  Infertility is usually defined as not being able to get pregnant after trying for one year of regular sexual intercourse without the use of contraceptives. Or not being able to carry a pregnancy to term and have a baby. The infertility rate in the [place] is around 10%. Pregnancy is the result of a chain of events. A woman must release an egg from one of her ovaries (ovulation). The egg must be fertilized by the female sperm. Then it travels through a fallopian tube into the uterus (womb), where it attaches to the wall of the uterus and grows. A man must have enough sperm, and the sperm must join with (fertilize) the egg along the way, at the proper time. The fertilized egg must then become attached to the inside of the uterus. While this may seem simple, many things can happen to prevent pregnancy from occurring.  WHOSE PROBLEM IS IT?  About 20% of infertility cases are due to problems with the man (female factors) and 65% are due to problems with the woman (female factors). Other cases are due to a combination of female and female factors or to unknown causes.  WHAT CAUSES INFERTILITY IN MEN?  Infertility in men is often caused by problems with making enough normal sperm or getting the sperm to reach the egg. Problems with sperm may exist from birth or develop later in life, due to illness or injury. Some men produce no sperm, or produce too few sperm (oligospermia). Other problems include:  Sexual dysfunction.  Hormonal or endocrine problems.  Age. Female fertility decreases with age, but not at as young an age as female fertility.  Infection.  Congenital problems. Birth defect, such as absence of the tubes that carry the sperm (vas deferens).  Genetic/chromosomal problems.  Antisperm antibody problems.  Retrograde ejaculation (sperm go into the bladder).  Varicoceles, spematoceles, or tumors of the testicles.  Lifestyle can influence the number and  quality of a man's sperm.  Alcohol and drugs can temporarily reduce sperm quality.  Environmental toxins, including pesticides and lead, may cause some cases of infertility in men. WHAT CAUSES INFERTILITY IN WOMEN?   Problems with ovulation account for most infertility in women. Without ovulation, eggs are not available to be fertilized.  Signs of problems with ovulation include irregular menstrual periods or no periods at all.  Simple lifestyle factors, including stress, diet, or athletic training, can affect a woman's hormonal balance.  Age. Fertility begins to decrease in women in the early 76s and is worse after age 71.  Much less often, a hormonal imbalance from a serious medical problem, such as a pituitary gland tumor, thyroid or other chronic medical disease, can cause ovulation problems.  Pelvic infections.  Polycystic ovary syndrome (increase in female hormones, unable to ovulate).  Alcohol or illegal drugs.  Environmental toxins, radiation, pesticides, and certain chemicals.  Aging is an important factor in female infertility.  The ability of a woman's ovaries to produce eggs declines with age, especially after age 27. About one third of couples where the woman is over 21 will have problems with fertility.  By the time she reaches menopause when her monthly periods stop for good, a woman can no longer produce eggs or become pregnant.  Other problems can also lead to infertility in women. If the fallopian tubes are blocked at one or both ends, the egg cannot travel through the tubes into the uterus. Scar tissue (adhesions) in the pelvis may cause blocked  tubes. This may result from pelvic inflammatory disease, endometriosis, or surgery for an ectopic pregnancy (fertilized egg implanted outside the uterus) or any pelvic or abdominal surgery causing adhesions.  Fibroid tumors or polyps of the uterus.  Congenital (birth defect) abnormalities of the uterus.  Infection of the  cervix (cervicitis).  Cervical stenosis (narrowing).  Abnormal cervical mucus.  Polycystic ovary syndrome.  Having sexual intercourse too often (every other day or 4 to 5 times a week).  Obesity.  Anorexia.  Poor nutrition.  Over exercising, with loss of body fat.  DES. Your mother received diethylstilbesterol hormone when pregnant with you. HOW IS INFERTILITY TESTED?  If you have been trying to have a baby without success, you may want to seek medical help. You should not wait for one year of trying before seeing a health care provider if:  You are over 35.  You have reason to believe that there may be a fertility problem. A medical evaluation may determine the reasons for a couple's infertility. Usually this process begins with:  Physical exams.  Medical histories of both partners.  Sexual histories of both partners. If there is no obvious problem, like improperly timed intercourse or absence of ovulation, tests may be needed.   For a man, testing usually begins with tests of his semen to look at:  The number of sperm.  The shape of sperm.  Movement of his sperm.  Taking a complete medical and surgical history.  Physical examination.  Check for infection of the female reproductive organs. Sometimes hormone tests are done.   For a woman, the first step in testing is to find out if she is ovulating each month. There are several ways to do this. For example, she can keep track of changes in her morning body temperature and in the texture of her cervical mucus. Another tool is a home ovulation test kit, which can be bought at drug or grocery stores.  Checks of ovulation can also be done in the doctor's office, using blood tests for hormone levels or ultrasound tests of the ovaries. If the woman is ovulating, more tests will need to be done. Some common female tests include:  Hysterosalpingogram: An x-ray of the fallopian tubes and uterus after they are injected with  dye. It shows if the tubes are open and shows the shape of the uterus.  Laparoscopy: An exam of the tubes and other female organs for disease. A lighted tube called a laparoscope is used to see inside the abdomen.  Endometrial biopsy: Sample of uterus tissue taken on the first day of the menstrual period, to see if the tissue indicates you are ovulating.  Transvaginal ultrasound: Examines the female organs.  Hysteroscopy: Uses a lighted tube to examine the cervix and inside the uterus, to see if there are any abnormalities inside the uterus. TREATMENT  Depending on the test results, different treatments can be suggested. The type of treatment depends on the cause. 85 to 90% of infertility cases are treated with drugs or surgery.   Various fertility drugs may be used for women with ovulation problems. It is important to talk with your caregiver about the drug to be used. You should understand the drug's benefits and side effects. Depending on the type of fertility drug and the dosage of the drug used, multiple births (twins or multiples) can occur in some women.  If needed, surgery can be done to repair damage to a woman's ovaries, fallopian tubes, cervix, or uterus.  Surgery  or medical treatment for endometriosis or polycystic ovary syndrome. Sometimes a man has an infertility problem that can be corrected with medicine or by surgery.  Intrauterine insemination (IUI) of sperm, timed with ovulation.  Change in lifestyle, if that is the cause (lose weight, increase exercise, and stop smoking, drinking excessively, or taking illegal drugs).  Other types of surgery:  Removing growths inside and on the uterus.  Removing scar tissue from inside of the uterus.  Fixing blocked tubes.  Removing scar tissue in the pelvis and around the female organs. WHAT IS ASSISTED REPRODUCTIVE TECHNOLOGY (ART)?  Assisted reproductive technology (ART) is another form of special methods used to help infertile  couples. ART involves handling both the woman's eggs and the man's sperm. Success rates vary and depend on many factors. ART can be expensive and time-consuming. But ART has made it possible for many couples to have children that otherwise would not have been conceived. Some methods are listed below:  In vitro fertilization (IVF). IVF is often used when a woman's fallopian tubes are blocked or when a man has low sperm counts. A drug is used to stimulate the ovaries to produce multiple eggs. Once mature, the eggs are removed and placed in a culture dish with the man's sperm for fertilization. After about 40 hours, the eggs are examined to see if they have become fertilized by the sperm and are dividing into cells. These fertilized eggs (embryos) are then placed in the woman's uterus. This bypasses the fallopian tubes.  Gamete intrafallopian transfer (GIFT) is similar to IVF, but used when the woman has at least one normal fallopian tube. Three to five eggs are placed in the fallopian tube, along with the man's sperm, for fertilization inside the woman's body.  Zygote intrafallopian transfer (ZIFT), also called tubal embryo transfer, combines IVF and GIFT. The eggs retrieved from the woman's ovaries are fertilized in the lab and placed in the fallopian tubes rather than in the uterus.  ART procedures sometimes involve the use of donor eggs (eggs from another woman) or previously frozen embryos. Donor eggs may be used if a woman has impaired ovaries or has a genetic disease that could be passed on to her baby.  When performing ART, you are at higher risk for resulting in multiple pregnancies, twins, triplets or more.  Intracytoplasma sperm injection is a procedure that injects a single sperm into the egg to fertilize it.  Embryo transplant is a procedure that starts after growing an embryo in a special media (chemical solution) developed to keep the embryo alive for 2 to 5 days, and then transplanting it  into the uterus. In cases where a cause cannot be found and pregnancy does not occur, adoption may be a consideration. Document Released: 12/19/2003 Document Revised: 03/09/2012 Document Reviewed: 11/14/2009 [facility] Patient Information 2015 [place], [place]. This information is not intended to replace advice given to you by your health care provider. Make sure you discuss any questions you have with your health care provider.

## 2015-07-19 ENCOUNTER — Encounter: Payer: Self-pay | Admitting: Physician Assistant

## 2015-07-19 ENCOUNTER — Ambulatory Visit (INDEPENDENT_AMBULATORY_CARE_PROVIDER_SITE_OTHER): Payer: Medicaid Other | Admitting: Physician Assistant

## 2015-07-19 VITALS — BP 109/74 | HR 85 | Temp 97.2°F | Wt 113.2 lb

## 2015-07-19 DIAGNOSIS — L0291 Cutaneous abscess, unspecified: Secondary | ICD-10-CM | POA: Diagnosis not present

## 2015-07-19 DIAGNOSIS — L0390 Cellulitis, unspecified: Secondary | ICD-10-CM

## 2015-07-19 MED ORDER — CEPHALEXIN 500 MG PO CAPS: 500.0000 mg | ORAL_CAPSULE | Freq: Two times a day (BID) | ORAL | Status: DC

## 2015-07-19 NOTE — Patient Instructions (Signed)

## 2015-07-19 NOTE — Progress Notes (Signed)
Subjective:     Patient ID: Laurie Howell, female   DOB: 03-21-1976, 39 y.o.   MRN: [id number]  HPI Pt with pain to the L axilla No known injury Denies any drainage from area No OTC meds for sx  Review of Systems  Constitutional: Negative.   Musculoskeletal: Positive for myalgias.  Skin: Positive for color change.       Objective:   Physical Exam + erythema and induration to the L axilla No definite area of fluct + TTP of same No drainage or ulceration seen    Assessment:     Cellulitis    Plan:     Warm compresses Avoid manipulation Keflex bid x 10 days Informed this may need I&D and to F/u if sx cont

## 2015-07-20 ENCOUNTER — Emergency Department (HOSPITAL_COMMUNITY)
Admission: EM | Admit: 2015-07-20 | Discharge: 2015-07-21 | Disposition: A | Discharge status: Home / Self Care | Payer: Medicaid Other | Attending: Emergency Medicine | Admitting: Emergency Medicine

## 2015-07-20 ENCOUNTER — Encounter (HOSPITAL_COMMUNITY): Payer: Self-pay | Admitting: *Deleted

## 2015-07-20 DIAGNOSIS — Z8719 Personal history of other diseases of the digestive system: Secondary | ICD-10-CM | POA: Insufficient documentation

## 2015-07-20 DIAGNOSIS — Z87440 Personal history of urinary (tract) infections: Secondary | ICD-10-CM | POA: Insufficient documentation

## 2015-07-20 DIAGNOSIS — Z862 Personal history of diseases of the blood and blood-forming organs and certain disorders involving the immune mechanism: Secondary | ICD-10-CM | POA: Diagnosis not present

## 2015-07-20 DIAGNOSIS — Z792 Long term (current) use of antibiotics: Secondary | ICD-10-CM | POA: Insufficient documentation

## 2015-07-20 DIAGNOSIS — Z87891 Personal history of nicotine dependence: Secondary | ICD-10-CM | POA: Diagnosis not present

## 2015-07-20 DIAGNOSIS — L02412 Cutaneous abscess of left axilla: Secondary | ICD-10-CM | POA: Diagnosis present

## 2015-07-20 MED ORDER — SULFAMETHOXAZOLE-TRIMETHOPRIM 800-160 MG PO TABS: 1.0000 | ORAL_TABLET | Freq: Two times a day (BID) | ORAL | Status: AC

## 2015-07-20 MED ORDER — SULFAMETHOXAZOLE-TRIMETHOPRIM 800-160 MG PO TABS
1.0000 | ORAL_TABLET | Freq: Once | ORAL | Status: AC
  Administered 2015-07-20: 1 via ORAL
  Filled 2015-07-20: qty 1

## 2015-07-20 MED ORDER — LIDOCAINE HCL (PF) 1 % IJ SOLN
INTRAMUSCULAR | Status: AC
  Filled 2015-07-20: qty 5

## 2015-07-20 MED ORDER — HYDROCODONE-ACETAMINOPHEN 5-325 MG PO TABS
1.0000 | ORAL_TABLET | Freq: Once | ORAL | Status: AC
  Administered 2015-07-20: 1 via ORAL
  Filled 2015-07-20: qty 1

## 2015-07-20 MED ORDER — HYDROCODONE-ACETAMINOPHEN 5-325 MG PO TABS: 1.0000 | ORAL_TABLET | ORAL | Status: DC | PRN

## 2015-07-20 NOTE — ED Notes (Signed)
Pt c/o abscess to left axilla area that started a few days ago,

## 2015-07-20 NOTE — Discharge Instructions (Signed)

## 2015-07-21 NOTE — ED Notes (Signed)
Telfa, 4x4's applied to left axilla area, pt tolerated well,  ]

## 2015-07-22 NOTE — ED Provider Notes (Signed)
CSN: [id number]     Arrival date & time 07/20/15  2243 History   First MD Initiated Contact with Patient 07/20/15 2256     Chief Complaint  Patient presents with  . Abscess     (Consider location/radiation/quality/duration/timing/severity/associated sxs/prior Treatment) Patient is a 39 y.o. female presenting with abscess. The history is provided by the patient.  Abscess Location:  Shoulder/arm Shoulder/arm abscess location:  L axilla Abscess quality: induration, painful and redness   Red streaking: no   Duration:  3 days Progression:  Worsening Pain details:    Quality:  Sharp and throbbing   Severity:  Moderate   Duration:  1 day   Timing:  Constant   Progression:  Worsening Chronicity:  New Context: not diabetes, not immunosuppression, not injected drug use and not skin injury   Relieved by:  Nothing Worsened by:  Nothing tried Ineffective treatments:  Warm compresses Associated symptoms: no fever, no nausea and no vomiting     Past Medical History  Diagnosis Date  . Esophageal stricture   . Headache   . Anemia   . Frequent UTI    Past Surgical History  Procedure Laterality Date  . Tubal ligation  1997  . Tubal reversal  02/2015    [facility] - Dr Karie Kirks   Family History  Problem Relation Age of Onset  . Depression Mother   . Hypertension Mother   . Heart disease Father   . Hypertension Father   . Anxiety disorder Father   . Heart attack Father    History  Substance Use Topics  . Smoking status: Former Smoker    Types: Cigarettes    Quit date: 12/30/1994  . Smokeless tobacco: Never Used  . Alcohol Use: No   OB History    Gravida Para Term Preterm AB TAB SAB Ectopic Multiple Living   6 2 2  4  4   2      Review of Systems  Constitutional: Negative for fever and chills.  Respiratory: Negative for shortness of breath and wheezing.   Gastrointestinal: Negative for nausea and vomiting.  Skin: Positive for color change and wound.  Neurological: Negative  for numbness.      Allergies  Aspirin and Ivp dye  Home Medications   Prior to Admission medications   Medication Sig Start Date End Date Taking? Authorizing Provider  cephALEXin (KEFLEX) 500 MG capsule Take 1 capsule (500 mg total) by mouth 2 (two) times daily. 07/19/15   Lodema Pilot, PA-C  HYDROcodone-acetaminophen (NORCO/VICODIN) 5-325 MG per tablet Take 1 tablet by mouth every 4 (four) hours as needed for moderate pain. 07/20/15   Evalee Jefferson, PA-C  sulfamethoxazole-trimethoprim (BACTRIM DS,SEPTRA DS) 800-160 MG per tablet Take 1 tablet by mouth 2 (two) times daily. 07/20/15 07/27/15  Evalee Jefferson, PA-C   BP 124/88 mmHg  Pulse 106  Temp(Src) 98.2 F (36.8 C) (Oral)  Resp 16  Wt 110 lb (49.896 kg)  SpO2 100%  LMP 06/28/2015 Physical Exam  Constitutional: She appears well-developed and well-nourished. No distress.  HENT:  Head: Normocephalic.  Neck: Neck supple.  Cardiovascular: Normal rate.   Pulmonary/Chest: Effort normal. She has no wheezes.  Musculoskeletal: Normal range of motion. She exhibits no edema.  Skin:  2 cm raised, erythematous induration left axilla. Minimal surrounding erythema, no drainage, no red streaking.    ED Course  Procedures (including critical care time)  INCISION AND DRAINAGE Performed by: Evalee Jefferson Consent: Verbal consent obtained. Risks and benefits: risks, benefits and alternatives  were discussed Type: abscess  Body area: left axilla  Anesthesia: local infiltration  Incision was made with a scalpel.  Local anesthetic: lidocaine  1% without epinephrine  Anesthetic total: 5 ml  Complexity: complex Blunt dissection to break up loculations  Drainage: purulent  Drainage amount: moderate  Packing material: 1/4 in iodoform gauze  Patient tolerance: Patient tolerated the procedure well with no immediate complications.    Labs Review Labs Reviewed - No data to display  Imaging Review No results found.   EKG  Interpretation None      MDM   Final diagnoses:  Abscess of axilla, left    Advised frequent warm compresses to keep the site open and draining.  Prescribed bactrim, hydrocodone. Encouraged recheck here or by pcp for any worsening or persistent sx.  The patient appears reasonably screened and/or stabilized for discharge and I doubt any other medical condition or other [facility] requiring further screening, evaluation, or treatment in the ED at this time prior to discharge.     Evalee Jefferson, PA-C 07/22/15 [place], MD 08/08/15 [phone]

## 2015-08-11 ENCOUNTER — Encounter: Payer: Self-pay | Admitting: Family

## 2015-08-11 ENCOUNTER — Ambulatory Visit (INDEPENDENT_AMBULATORY_CARE_PROVIDER_SITE_OTHER): Payer: Medicaid Other | Admitting: Family

## 2015-08-11 VITALS — BP 115/80 | HR 84 | Temp 97.3°F | Ht 62.0 in | Wt 112.0 lb

## 2015-08-11 DIAGNOSIS — N912 Amenorrhea, unspecified: Secondary | ICD-10-CM | POA: Diagnosis not present

## 2015-08-11 LAB — POCT URINE PREGNANCY: Preg Test, Ur: NEGATIVE

## 2015-08-11 NOTE — Patient Instructions (Signed)
First Trimester of Pregnancy The first trimester of pregnancy is from week 1 until the end of week 12 (months 1 through 3). A week after a sperm fertilizes an egg, the egg will implant on the wall of the uterus. This embryo will begin to develop into a baby. Genes from you and your partner are forming the baby. The female genes determine whether the baby is a boy or a girl. At 6-8 weeks, the eyes and face are formed, and the heartbeat can be seen on ultrasound. At the end of 12 weeks, all the baby's organs are formed.  Now that you are pregnant, you will want to do everything you can to have a healthy baby. Two of the most important things are to get good prenatal care and to follow your health care provider's instructions. Prenatal care is all the medical care you receive before the baby's birth. This care will help prevent, find, and treat any problems during the pregnancy and childbirth. BODY CHANGES Your body goes through many changes during pregnancy. The changes vary from woman to woman.   You may gain or lose a couple of pounds at first.  You may feel sick to your stomach (nauseous) and throw up (vomit). If the vomiting is uncontrollable, call your health care provider.  You may tire easily.  You may develop headaches that can be relieved by medicines approved by your health care provider.  You may urinate more often. Painful urination may mean you have a bladder infection.  You may develop heartburn as a result of your pregnancy.  You may develop constipation because certain hormones are causing the muscles that push waste through your intestines to slow down.  You may develop hemorrhoids or swollen, bulging veins (varicose veins).  Your breasts may begin to grow larger and become tender. Your nipples may stick out more, and the tissue that surrounds them (areola) may become darker.  Your gums may bleed and may be sensitive to brushing and flossing.  Dark spots or blotches (chloasma,  mask of pregnancy) may develop on your face. This will likely fade after the baby is born.  Your menstrual periods will stop.  You may have a loss of appetite.  You may develop cravings for certain kinds of food.  You may have changes in your emotions from day to day, such as being excited to be pregnant or being concerned that something may go wrong with the pregnancy and baby.  You may have more vivid and strange dreams.  You may have changes in your hair. These can include thickening of your hair, rapid growth, and changes in texture. Some women also have hair loss during or after pregnancy, or hair that feels dry or thin. Your hair will most likely return to normal after your baby is born. WHAT TO EXPECT AT YOUR PRENATAL VISITS During a routine prenatal visit:  You will be weighed to make sure you and the baby are growing normally.  Your blood pressure will be taken.  Your abdomen will be measured to track your baby's growth.  The fetal heartbeat will be listened to starting around week 10 or 12 of your pregnancy.  Test results from any previous visits will be discussed. Your health care provider may ask you:  How you are feeling.  If you are feeling the baby move.  If you have had any abnormal symptoms, such as leaking fluid, bleeding, severe headaches, or abdominal cramping.  If you have any questions. Other tests   that may be performed during your first trimester include:  Blood tests to find your blood type and to check for the presence of any previous infections. They will also be used to check for low iron levels (anemia) and Rh antibodies. Later in the pregnancy, blood tests for diabetes will be done along with other tests if problems develop.  Urine tests to check for infections, diabetes, or protein in the urine.  An ultrasound to confirm the proper growth and development of the baby.  An amniocentesis to check for possible genetic problems.  Fetal screens for  spina bifida and Down syndrome.  You may need other tests to make sure you and the baby are doing well. HOME CARE INSTRUCTIONS  Medicines  Follow your health care provider's instructions regarding medicine use. Specific medicines may be either safe or unsafe to take during pregnancy.  Take your prenatal vitamins as directed.  If you develop constipation, try taking a stool softener if your health care provider approves. Diet  Eat regular, well-balanced meals. Choose a variety of foods, such as meat or vegetable-based protein, fish, milk and low-fat dairy products, vegetables, fruits, and whole grain breads and cereals. Your health care provider will help you determine the amount of weight gain that is right for you.  Avoid raw meat and uncooked cheese. These carry germs that can cause birth defects in the baby.  Eating four or five small meals rather than three large meals a day may help relieve nausea and vomiting. If you start to feel nauseous, eating a few soda crackers can be helpful. Drinking liquids between meals instead of during meals also seems to help nausea and vomiting.  If you develop constipation, eat more high-fiber foods, such as fresh vegetables or fruit and whole grains. Drink enough fluids to keep your urine clear or pale yellow. Activity and Exercise  Exercise only as directed by your health care provider. Exercising will help you:  Control your weight.  Stay in shape.  Be prepared for labor and delivery.  Experiencing pain or cramping in the lower abdomen or low back is a good sign that you should stop exercising. Check with your health care provider before continuing normal exercises.  Try to avoid standing for long periods of time. Move your legs often if you must stand in one place for a long time.  Avoid heavy lifting.  Wear low-heeled shoes, and practice good posture.  You may continue to have sex unless your health care provider directs you  otherwise. Relief of Pain or Discomfort  Wear a good support bra for breast tenderness.   Take warm sitz baths to soothe any pain or discomfort caused by hemorrhoids. Use hemorrhoid cream if your health care provider approves.   Rest with your legs elevated if you have leg cramps or low back pain.  If you develop varicose veins in your legs, wear support hose. Elevate your feet for 15 minutes, 3-4 times a day. Limit salt in your diet. Prenatal Care  Schedule your prenatal visits by the twelfth week of pregnancy. They are usually scheduled monthly at first, then more often in the last 2 months before delivery.  Write down your questions. Take them to your prenatal visits.  Keep all your prenatal visits as directed by your health care provider. Safety  Wear your seat belt at all times when driving.  Make a list of emergency phone numbers, including numbers for family, friends, the hospital, and police and fire departments. General Tips    Ask your health care provider for a referral to a local prenatal education class. Begin classes no later than at the beginning of month 6 of your pregnancy.  Ask for help if you have counseling or nutritional needs during pregnancy. Your health care provider can offer advice or refer you to specialists for help with various needs.  Do not use hot tubs, steam rooms, or saunas.  Do not douche or use tampons or scented sanitary pads.  Do not cross your legs for long periods of time.  Avoid cat litter boxes and soil used by cats. These carry germs that can cause birth defects in the baby and possibly loss of the fetus by miscarriage or stillbirth.  Avoid all smoking, herbs, alcohol, and medicines not prescribed by your health care provider. Chemicals in these affect the formation and growth of the baby.  Schedule a dentist appointment. At home, brush your teeth with a soft toothbrush and be gentle when you floss. SEEK MEDICAL CARE IF:   You have  dizziness.  You have mild pelvic cramps, pelvic pressure, or nagging pain in the abdominal area.  You have persistent nausea, vomiting, or diarrhea.  You have a bad smelling vaginal discharge.  You have pain with urination.  You notice increased swelling in your face, hands, legs, or ankles. SEEK IMMEDIATE MEDICAL CARE IF:   You have a fever.  You are leaking fluid from your vagina.  You have spotting or bleeding from your vagina.  You have severe abdominal cramping or pain.  You have rapid weight gain or loss.  You vomit blood or material that looks like coffee grounds.  You are exposed to German measles and have never had them.  You are exposed to fifth disease or chickenpox.  You develop a severe headache.  You have shortness of breath.  You have any kind of trauma, such as from a fall or a car accident. Document Released: 12/10/2001 Document Revised: 05/02/2014 Document Reviewed: 10/26/2013 [employer] Patient Information 2015 [employer]. This information is not intended to replace advice given to you by your health care provider. Make sure you discuss any questions you have with your health care provider.  

## 2015-08-11 NOTE — Progress Notes (Signed)
   Subjective:    Patient ID: Laurie Howell, female    DOB: 1976/12/08, 39 y.o.   MRN: [id number]  HPI Pt presents to the office with amenorrhea. Pt states she is two weeks past her normal menstrual cycle. PT states she has taken one pregnancy test at home that was positive and one that was negative. Pt states she had her "tubes reversed" in March and has been trying to become pregnant since then. Pt states she had to have "blood drawn" to confirm her other two pregnancies. Pt states that was 19 years ago, but her "urine was always negative".    Review of Systems  Constitutional: Negative.   HENT: Negative.   Eyes: Negative.   Respiratory: Negative.  Negative for shortness of breath.   Cardiovascular: Negative.  Negative for palpitations.  Gastrointestinal: Negative.   Endocrine: Negative.   Genitourinary: Negative.   Musculoskeletal: Negative.   Neurological: Negative.  Negative for headaches.  Hematological: Negative.   Psychiatric/Behavioral: Negative.   All other systems reviewed and are negative.      Objective:   Physical Exam  Constitutional: She is oriented to person, place, and time. She appears well-developed and well-nourished. No distress.  HENT:  Head: Normocephalic and atraumatic.  Right Ear: External ear normal.  Left Ear: External ear normal.  Nose: Nose normal.  Mouth/Throat: Oropharynx is clear and moist.  Eyes: Pupils are equal, round, and reactive to light.  Neck: Normal range of motion. Neck supple. No thyromegaly present.  Cardiovascular: Normal rate, regular rhythm, normal heart sounds and intact distal pulses.   No murmur heard. Pulmonary/Chest: Effort normal and breath sounds normal. No respiratory distress. She has no wheezes.  Abdominal: Soft. Bowel sounds are normal. She exhibits no distension. There is no tenderness.  Musculoskeletal: Normal range of motion. She exhibits no edema or tenderness.  Neurological: She is alert and oriented to person,  place, and time. She has normal reflexes. No cranial nerve deficit.  Skin: Skin is warm and dry.  Psychiatric: She has a normal mood and affect. Her behavior is normal. Judgment and thought content normal.  Vitals reviewed.   BP 115/80 mmHg  Pulse 84  Temp(Src) 97.3 F (36.3 C) (Oral)  Ht 5\' 2"  (1.575 m)  Wt 112 lb (50.803 kg)  BMI 20.48 kg/m2  LMP 06/28/2015       Assessment & Plan:  1. Amenorrhea -Encouraged healthy diet  -Avoid alcohol and OTC medications -Will refer to OB if HcG is positive -RTO prn - POCT urine pregnancy - hCG, quantitative, pregnancy  Evelina Dun, FNP

## 2015-08-12 LAB — HCG, SERUM, QUALITATIVE: HCG, BETA SUBUNIT, QUAL, SERUM: NEGATIVE m[IU]/mL (ref <6)

## 2015-08-14 NOTE — Progress Notes (Signed)
Patient aware.

## 2015-11-30 ENCOUNTER — Encounter: Payer: Medicaid Other | Admitting: Obstetrics and Gynecology

## 2015-12-04 ENCOUNTER — Encounter: Payer: Medicaid Other | Admitting: Obstetrics and Gynecology

## 2015-12-04 ENCOUNTER — Telehealth: Payer: Self-pay | Admitting: Family Medicine

## 2016-01-11 ENCOUNTER — Ambulatory Visit (INDEPENDENT_AMBULATORY_CARE_PROVIDER_SITE_OTHER): Payer: Medicaid Other | Admitting: Family

## 2016-01-11 ENCOUNTER — Encounter: Payer: Self-pay | Admitting: Family

## 2016-01-11 VITALS — BP 117/73 | HR 95 | Temp 97.3°F | Ht 62.0 in | Wt 114.6 lb

## 2016-01-11 DIAGNOSIS — G4700 Insomnia, unspecified: Secondary | ICD-10-CM | POA: Diagnosis not present

## 2016-01-11 DIAGNOSIS — N926 Irregular menstruation, unspecified: Secondary | ICD-10-CM | POA: Diagnosis not present

## 2016-01-11 LAB — POCT URINE PREGNANCY: PREG TEST UR: NEGATIVE

## 2016-01-11 MED ORDER — MEDROXYPROGESTERONE ACETATE 150 MG/ML IM SUSP: 150.0000 mg | INTRAMUSCULAR | Status: DC

## 2016-01-11 MED ORDER — MEDROXYPROGESTERONE ACETATE 150 MG/ML IM SUSP
150.0000 mg | INTRAMUSCULAR | Status: DC
  Administered 2016-01-11: 150 mg via INTRAMUSCULAR

## 2016-01-11 MED ORDER — TRAZODONE HCL 50 MG PO TABS: 25.0000 mg | ORAL_TABLET | Freq: Every evening | ORAL | Status: DC | PRN

## 2016-01-11 NOTE — Patient Instructions (Signed)
Medroxyprogesterone injection [Contraceptive] What is this medicine? MEDROXYPROGESTERONE (me DROX ee proe JES te rone) contraceptive injections prevent pregnancy. They provide effective birth control for 3 months. Depo-subQ Provera 104 is also used for treating pain related to endometriosis. This medicine may be used for other purposes; ask your health care provider or pharmacist if you have questions. What should I tell my health care provider before I take this medicine? They need to know if you have any of these conditions: -frequently drink alcohol -asthma -blood vessel disease or a history of a blood clot in the lungs or legs -bone disease such as osteoporosis -breast cancer -diabetes -eating disorder (anorexia nervosa or bulimia) -high blood pressure -HIV infection or AIDS -kidney disease -liver disease -mental depression -migraine -seizures (convulsions) -stroke -tobacco smoker -vaginal bleeding -an unusual or allergic reaction to medroxyprogesterone, other hormones, medicines, foods, dyes, or preservatives -pregnant or trying to get pregnant -breast-feeding How should I use this medicine? Depo-Provera Contraceptive injection is given into a muscle. Depo-subQ Provera 104 injection is given under the skin. These injections are given by a health care professional. You must not be pregnant before getting an injection. The injection is usually given during the first 5 days after the start of a menstrual period or 6 weeks after delivery of a baby. Talk to your pediatrician regarding the use of this medicine in children. Special care may be needed. These injections have been used in female children who have started having menstrual periods. Overdosage: If you think you have taken too much of this medicine contact a poison control center or emergency room at once. NOTE: This medicine is only for you. Do not share this medicine with others. What if I miss a dose? Try not to miss a  dose. You must get an injection once every 3 months to maintain birth control. If you cannot keep an appointment, call and reschedule it. If you wait longer than 13 weeks between Depo-Provera contraceptive injections or longer than 14 weeks between Depo-subQ Provera 104 injections, you could get pregnant. Use another method for birth control if you miss your appointment. You may also need a pregnancy test before receiving another injection. What may interact with this medicine? Do not take this medicine with any of the following medications: -bosentan This medicine may also interact with the following medications: -aminoglutethimide -antibiotics or medicines for infections, especially rifampin, rifabutin, rifapentine, and griseofulvin -aprepitant -barbiturate medicines such as phenobarbital or primidone -bexarotene -carbamazepine -medicines for seizures like ethotoin, felbamate, oxcarbazepine, phenytoin, topiramate -modafinil -St. John's wort This list may not describe all possible interactions. Give your health care provider a list of all the medicines, herbs, non-prescription drugs, or dietary supplements you use. Also tell them if you smoke, drink alcohol, or use illegal drugs. Some items may interact with your medicine. What should I watch for while using this medicine? This drug does not protect you against HIV infection (AIDS) or other sexually transmitted diseases. Use of this product may cause you to lose calcium from your bones. Loss of calcium may cause weak bones (osteoporosis). Only use this product for more than 2 years if other forms of birth control are not right for you. The longer you use this product for birth control the more likely you will be at risk for weak bones. Ask your health care professional how you can keep strong bones. You may have a change in bleeding pattern or irregular periods. Many females stop having periods while taking this drug. If you have  received your  injections on time, your chance of being pregnant is very low. If you think you may be pregnant, see your health care professional as soon as possible. Tell your health care professional if you want to get pregnant within the next year. The effect of this medicine may last a long time after you get your last injection. What side effects may I notice from receiving this medicine? Side effects that you should report to your doctor or health care professional as soon as possible: -allergic reactions like skin rash, itching or hives, swelling of the face, lips, or tongue -breast tenderness or discharge -breathing problems -changes in vision -depression -feeling faint or lightheaded, falls -fever -pain in the abdomen, chest, groin, or leg -problems with balance, talking, walking -unusually weak or tired -yellowing of the eyes or skin Side effects that usually do not require medical attention (report to your doctor or health care professional if they continue or are bothersome): -acne -fluid retention and swelling -headache -irregular periods, spotting, or absent periods -temporary pain, itching, or skin reaction at site where injected -weight gain This list may not describe all possible side effects. Call your doctor for medical advice about side effects. You may report side effects to FDA at 1-800-FDA-1088. Where should I keep my medicine? This does not apply. The injection will be given to you by a health care professional. NOTE: This sheet is a summary. It may not cover all possible information. If you have questions about this medicine, talk to your doctor, pharmacist, or health care provider.    2016, Elsevier/Gold Standard. (2009-01-06 18:37:56) Insomnia Insomnia is a sleep disorder that makes it difficult to fall asleep or to stay asleep. Insomnia can cause tiredness (fatigue), low energy, difficulty concentrating, mood swings, and poor performance at work or school.  There are three  different ways to classify insomnia:  Difficulty falling asleep.  Difficulty staying asleep.  Waking up too early in the morning. Any type of insomnia can be long-term (chronic) or short-term (acute). Both are common. Short-term insomnia usually lasts for three months or less. Chronic insomnia occurs at least three times a week for longer than three months. CAUSES  Insomnia may be caused by another condition, situation, or substance, such as:  Anxiety.  Certain medicines.  Gastroesophageal reflux disease (GERD) or other gastrointestinal conditions.  Asthma or other breathing conditions.  Restless legs syndrome, sleep apnea, or other sleep disorders.  Chronic pain.  Menopause. This may include hot flashes.  Stroke.  Abuse of alcohol, tobacco, or illegal drugs.  Depression.  Caffeine.   Neurological disorders, such as Alzheimer disease.  An overactive thyroid (hyperthyroidism). The cause of insomnia may not be known. RISK FACTORS Risk factors for insomnia include:  Gender. Women are more commonly affected than men.  Age. Insomnia is more common as you get older.  Stress. This may involve your professional or personal life.  Income. Insomnia is more common in people with lower income.  Lack of exercise.   Irregular work schedule or night shifts.  Traveling between different time zones. SIGNS AND SYMPTOMS If you have insomnia, trouble falling asleep or trouble staying asleep is the main symptom. This may lead to other symptoms, such as:  Feeling fatigued.  Feeling nervous about going to sleep.  Not feeling rested in the morning.  Having trouble concentrating.  Feeling irritable, anxious, or depressed. TREATMENT  Treatment for insomnia depends on the cause. If your insomnia is caused by an underlying condition, treatment will  focus on addressing the condition. Treatment may also include:   Medicines to help you sleep.  Counseling or  therapy.  Lifestyle adjustments. HOME CARE INSTRUCTIONS   Take medicines only as directed by your health care provider.  Keep regular sleeping and waking hours. Avoid naps.  Keep a sleep diary to help you and your health care provider figure out what could be causing your insomnia. Include:   When you sleep.  When you wake up during the night.  How well you sleep.   How rested you feel the next day.  Any side effects of medicines you are taking.  What you eat and drink.   Make your bedroom a comfortable place where it is easy to fall asleep:  Put up shades or special blackout curtains to block light from outside.  Use a white noise machine to block noise.  Keep the temperature cool.   Exercise regularly as directed by your health care provider. Avoid exercising right before bedtime.  Use relaxation techniques to manage stress. Ask your health care provider to suggest some techniques that may work well for you. These may include:  Breathing exercises.  Routines to release muscle tension.  Visualizing peaceful scenes.  Cut back on alcohol, caffeinated beverages, and cigarettes, especially close to bedtime. These can disrupt your sleep.  Do not overeat or eat spicy foods right before bedtime. This can lead to digestive discomfort that can make it hard for you to sleep.  Limit screen use before bedtime. This includes:  Watching TV.  Using your smartphone, tablet, and computer.  Stick to a routine. This can help you fall asleep faster. Try to do a quiet activity, brush your teeth, and go to bed at the same time each night.  Get out of bed if you are still awake after 15 minutes of trying to sleep. Keep the lights down, but try reading or doing a quiet activity. When you feel sleepy, go back to bed.  Make sure that you drive carefully. Avoid driving if you feel very sleepy.  Keep all follow-up appointments as directed by your health care provider. This is  important. SEEK MEDICAL CARE IF:   You are tired throughout the day or have trouble in your daily routine due to sleepiness.  You continue to have sleep problems or your sleep problems get worse. SEEK IMMEDIATE MEDICAL CARE IF:   You have serious thoughts about hurting yourself or someone else.   This information is not intended to replace advice given to you by your health care provider. Make sure you discuss any questions you have with your health care provider.   Document Released: 12/13/2000 Document Revised: 09/06/2015 Document Reviewed: 09/16/2014 Elsevier Interactive Patient Education [employer].

## 2016-01-11 NOTE — Addendum Note (Signed)
Addended by: Zannie Cove on: 01/11/2016 01:54 PM   Modules accepted: Orders

## 2016-01-11 NOTE — Progress Notes (Signed)
   Subjective:    Patient ID: Laurie Howell, female    DOB: 1976/09/23, 40 y.o.   MRN: [id number]  HPI Pt presents to the office today with abnormal menses. Pt states she had her "tubes reversed" in March 2016. Pt states since then she has had two periods a month periodically. PT states she is no longer wanting to become pregnant because she is going thru a divorce. Pt states she is dating someone now, but states after having sex she has a small amount of blood after sex. Pt states she is currently on her period now. Pt is also complaining of Insomnia. Pt states she has taken Tylenol PM for years, but states the last few months it does not seem to be working any more.    Review of Systems  Constitutional: Negative.   HENT: Negative.   Eyes: Negative.   Respiratory: Negative.  Negative for shortness of breath.   Cardiovascular: Negative.  Negative for palpitations.  Gastrointestinal: Negative.   Endocrine: Negative.   Genitourinary: Negative.   Musculoskeletal: Negative.   Neurological: Negative.  Negative for headaches.  Hematological: Negative.   Psychiatric/Behavioral: Negative.   All other systems reviewed and are negative.      Objective:   Physical Exam  Constitutional: She is oriented to person, place, and time. She appears well-developed and well-nourished. No distress.  HENT:  Head: Normocephalic and atraumatic.  Right Ear: External ear normal.  Left Ear: External ear normal.  Nose: Nose normal.  Mouth/Throat: Oropharynx is clear and moist.  Eyes: Pupils are equal, round, and reactive to light.  Neck: Normal range of motion. Neck supple. No thyromegaly present.  Cardiovascular: Normal rate, regular rhythm, normal heart sounds and intact distal pulses.   No murmur heard. Pulmonary/Chest: Effort normal and breath sounds normal. No respiratory distress. She has no wheezes.  Abdominal: Soft. Bowel sounds are normal. She exhibits no distension. There is no tenderness.    Musculoskeletal: Normal range of motion. She exhibits no edema or tenderness.  Neurological: She is alert and oriented to person, place, and time. She has normal reflexes. No cranial nerve deficit.  Skin: Skin is warm and dry.  Psychiatric: She has a normal mood and affect. Her behavior is normal. Judgment and thought content normal.  Vitals reviewed.     BP 117/73 mmHg  Pulse 95  Temp(Src) 97.3 F (36.3 C) (Oral)  Ht 5\' 2"  (1.575 m)  Wt 114 lb 9.6 oz (51.982 kg)  BMI 20.96 kg/m2  LMP 01/11/2016     Assessment & Plan:  1. Abnormal menstrual periods -Pt started on Dep-Provera today - POCT urine pregnancy - medroxyPROGESTERone (DEPO-PROVERA) 150 MG/ML injection; Inject 1 mL (150 mg total) into the muscle every 3 (three) months.  Dispense: 1 mL; Refill: 6  2. Insomnia -Sleep ritual discussed -Stress managment - traZODone (DESYREL) 50 MG tablet; Take 0.5-1 tablets (25-50 mg total) by mouth at bedtime as needed for sleep.  Dispense: 30 tablet; Refill: 3   Continue all meds Labs pending Health Maintenance reviewed Diet and exercise encouraged RTO 6 months and pt encouraged to schedule pap  Evelina Dun, FNP

## 2016-01-25 ENCOUNTER — Encounter: Payer: Medicaid Other | Admitting: Family

## 2016-03-21 ENCOUNTER — Telehealth: Payer: Self-pay | Admitting: Family

## 2016-03-21 NOTE — Telephone Encounter (Signed)
Denied.

## 2016-03-28 ENCOUNTER — Encounter: Payer: Self-pay | Admitting: Family

## 2016-03-28 ENCOUNTER — Ambulatory Visit (INDEPENDENT_AMBULATORY_CARE_PROVIDER_SITE_OTHER): Payer: Medicaid Other | Admitting: Family

## 2016-03-28 VITALS — BP 105/70 | HR 93 | Temp 98.2°F | Ht 62.0 in | Wt 117.8 lb

## 2016-03-28 DIAGNOSIS — Z0000 Encounter for general adult medical examination without abnormal findings: Secondary | ICD-10-CM

## 2016-03-28 DIAGNOSIS — G43019 Migraine without aura, intractable, without status migrainosus: Secondary | ICD-10-CM

## 2016-03-28 DIAGNOSIS — D649 Anemia, unspecified: Secondary | ICD-10-CM

## 2016-03-28 DIAGNOSIS — K219 Gastro-esophageal reflux disease without esophagitis: Secondary | ICD-10-CM | POA: Insufficient documentation

## 2016-03-28 DIAGNOSIS — K2100 Gastro-esophageal reflux disease with esophagitis, without bleeding: Secondary | ICD-10-CM

## 2016-03-28 DIAGNOSIS — D509 Iron deficiency anemia, unspecified: Secondary | ICD-10-CM | POA: Insufficient documentation

## 2016-03-28 DIAGNOSIS — Z01419 Encounter for gynecological examination (general) (routine) without abnormal findings: Secondary | ICD-10-CM

## 2016-03-28 HISTORY — DX: Iron deficiency anemia, unspecified: D50.9

## 2016-03-28 MED ORDER — OMEPRAZOLE 20 MG PO CPDR: 20.0000 mg | DELAYED_RELEASE_CAPSULE | Freq: Every day | ORAL | Status: DC

## 2016-03-28 NOTE — Patient Instructions (Addendum)
Health Maintenance, Female Adopting a healthy lifestyle and getting preventive care can go a long way to promote health and wellness. Talk with your health care provider about what schedule of regular examinations is right for you. This is a good chance for you to check in with your provider about disease prevention and staying healthy. In between checkups, there are plenty of things you can do on your own. Experts have done a lot of research about which lifestyle changes and preventive measures are most likely to keep you healthy. Ask your health care provider for more information. WEIGHT AND DIET  Eat a healthy diet  Be sure to include plenty of vegetables, fruits, low-fat dairy products, and lean protein.  Do not eat a lot of foods high in solid fats, added sugars, or salt.  Get regular exercise. This is one of the most important things you can do for your health.  Most adults should exercise for at least 150 minutes each week. The exercise should increase your heart rate and make you sweat (moderate-intensity exercise).  Most adults should also do strengthening exercises at least twice a week. This is in addition to the moderate-intensity exercise.  Maintain a healthy weight  Body mass index (BMI) is a measurement that can be used to identify possible weight problems. It estimates body fat based on height and weight. Your health care provider can help determine your BMI and help you achieve or maintain a healthy weight.  For females 20 years of age and older:   A BMI below 18.5 is considered underweight.  A BMI of 18.5 to 24.9 is normal.  A BMI of 25 to 29.9 is considered overweight.  A BMI of 30 and above is considered obese.  Watch levels of cholesterol and blood lipids  You should start having your blood tested for lipids and cholesterol at 40 years of age, then have this test every 5 years.  You may need to have your cholesterol levels checked more often if:  Your lipid  or cholesterol levels are high.  You are older than 40 years of age.  You are at high risk for heart disease.  CANCER SCREENING   Lung Cancer  Lung cancer screening is recommended for adults 55-80 years old who are at high risk for lung cancer because of a history of smoking.  A yearly low-dose CT scan of the lungs is recommended for people who:  Currently smoke.  Have quit within the past 15 years.  Have at least a 30-pack-year history of smoking. A pack year is smoking an average of one pack of cigarettes a day for 1 year.  Yearly screening should continue until it has been 15 years since you quit.  Yearly screening should stop if you develop a health problem that would prevent you from having lung cancer treatment.  Breast Cancer  Practice breast self-awareness. This means understanding how your breasts normally appear and feel.  It also means doing regular breast self-exams. Let your health care provider know about any changes, no matter how small.  If you are in your 20s or 30s, you should have a clinical breast exam (CBE) by a health care provider every 1-3 years as part of a regular health exam.  If you are 40 or older, have a CBE every year. Also consider having a breast X-ray (mammogram) every year.  If you have a family history of breast cancer, talk to your health care provider about genetic screening.  If you   are at high risk for breast cancer, talk to your health care provider about having an MRI and a mammogram every year.  Breast cancer gene (BRCA) assessment is recommended for women who have family members with BRCA-related cancers. BRCA-related cancers include:  Breast.  Ovarian.  Tubal.  Peritoneal cancers.  Results of the assessment will determine the need for genetic counseling and BRCA1 and BRCA2 testing. Cervical Cancer Your health care provider may recommend that you be screened regularly for cancer of the pelvic organs (ovaries, uterus, and  vagina). This screening involves a pelvic examination, including checking for microscopic changes to the surface of your cervix (Pap test). You may be encouraged to have this screening done every 3 years, beginning at age 21.  For women ages 30-65, health care providers may recommend pelvic exams and Pap testing every 3 years, or they may recommend the Pap and pelvic exam, combined with testing for human papilloma virus (HPV), every 5 years. Some types of HPV increase your risk of cervical cancer. Testing for HPV may also be done on women of any age with unclear Pap test results.  Other health care providers may not recommend any screening for nonpregnant women who are considered low risk for pelvic cancer and who do not have symptoms. Ask your health care provider if a screening pelvic exam is right for you.  If you have had past treatment for cervical cancer or a condition that could lead to cancer, you need Pap tests and screening for cancer for at least 20 years after your treatment. If Pap tests have been discontinued, your risk factors (such as having a new sexual partner) need to be reassessed to determine if screening should resume. Some women have medical problems that increase the chance of getting cervical cancer. In these cases, your health care provider may recommend more frequent screening and Pap tests. Colorectal Cancer  This type of cancer can be detected and often prevented.  Routine colorectal cancer screening usually begins at 40 years of age and continues through 40 years of age.  Your health care provider may recommend screening at an earlier age if you have risk factors for colon cancer.  Your health care provider may also recommend using home test kits to check for hidden blood in the stool.  A small camera at the end of a tube can be used to examine your colon directly (sigmoidoscopy or colonoscopy). This is done to check for the earliest forms of colorectal  cancer.  Routine screening usually begins at age 50.  Direct examination of the colon should be repeated every 5-10 years through 40 years of age. However, you may need to be screened more often if early forms of precancerous polyps or small growths are found. Skin Cancer  Check your skin from head to toe regularly.  Tell your health care provider about any new moles or changes in moles, especially if there is a change in a mole's shape or color.  Also tell your health care provider if you have a mole that is larger than the size of a pencil eraser.  Always use sunscreen. Apply sunscreen liberally and repeatedly throughout the day.  Protect yourself by wearing long sleeves, pants, a wide-brimmed hat, and sunglasses whenever you are outside. HEART DISEASE, DIABETES, AND HIGH BLOOD PRESSURE   High blood pressure causes heart disease and increases the risk of stroke. High blood pressure is more likely to develop in:  People who have blood pressure in the high end   of the normal range (130-139/85-89 mm Hg).  People who are overweight or obese.  People who are African American.  If you are 38-23 years of age, have your blood pressure checked every 3-5 years. If you are 61 years of age or older, have your blood pressure checked every year. You should have your blood pressure measured twice--once when you are at a hospital or clinic, and once when you are not at a hospital or clinic. Record the average of the two measurements. To check your blood pressure when you are not at a hospital or clinic, you can use:  An automated blood pressure machine at a pharmacy.  A home blood pressure monitor.  If you are between 45 years and 39 years old, ask your health care provider if you should take aspirin to prevent strokes.  Have regular diabetes screenings. This involves taking a blood sample to check your fasting blood sugar level.  If you are at a normal weight and have a low risk for diabetes,  have this test once every three years after 40 years of age.  If you are overweight and have a high risk for diabetes, consider being tested at a younger age or more often. PREVENTING INFECTION  Hepatitis B  If you have a higher risk for hepatitis B, you should be screened for this virus. You are considered at high risk for hepatitis B if:  You were born in a country where hepatitis B is common. Ask your health care provider which countries are considered high risk.  Your parents were born in a high-risk country, and you have not been immunized against hepatitis B (hepatitis B vaccine).  You have HIV or AIDS.  You use needles to inject street drugs.  You live with someone who has hepatitis B.  You have had sex with someone who has hepatitis B.  You get hemodialysis treatment.  You take certain medicines for conditions, including cancer, organ transplantation, and autoimmune conditions. Hepatitis C  Blood testing is recommended for:  Everyone born from [birth year] through 1965.  Anyone with known risk factors for hepatitis C. Sexually transmitted infections (STIs)  You should be screened for sexually transmitted infections (STIs) including gonorrhea and chlamydia if:  You are sexually active and are younger than 40 years of age.  You are older than 40 years of age and your health care provider tells you that you are at risk for this type of infection.  Your sexual activity has changed since you were last screened and you are at an increased risk for chlamydia or gonorrhea. Ask your health care provider if you are at risk.  If you do not have HIV, but are at risk, it may be recommended that you take a prescription medicine daily to prevent HIV infection. This is called pre-exposure prophylaxis (PrEP). You are considered at risk if:  You are sexually active and do not regularly use condoms or know the HIV status of your partner(s).  You take drugs by injection.  You are sexually  active with a partner who has HIV. Talk with your health care provider about whether you are at high risk of being infected with HIV. If you choose to begin PrEP, you should first be tested for HIV. You should then be tested every 3 months for as long as you are taking PrEP.  PREGNANCY   If you are premenopausal and you may become pregnant, ask your health care provider about preconception counseling.  If you may  become pregnant, take 400 to 800 micrograms (mcg) of folic acid every day.  If you want to prevent pregnancy, talk to your health care provider about birth control (contraception). OSTEOPOROSIS AND MENOPAUSE   Osteoporosis is a disease in which the bones lose minerals and strength with aging. This can result in serious bone fractures. Your risk for osteoporosis can be identified using a bone density scan.  If you are 26 years of age or older, or if you are at risk for osteoporosis and fractures, ask your health care provider if you should be screened.  Ask your health care provider whether you should take a calcium or vitamin D supplement to lower your risk for osteoporosis.  Menopause may have certain physical symptoms and risks.  Hormone replacement therapy may reduce some of these symptoms and risks. Talk to your health care provider about whether hormone replacement therapy is right for you.  HOME CARE INSTRUCTIONS   Schedule regular health, dental, and eye exams.  Stay current with your immunizations.   Do not use any tobacco products including cigarettes, chewing tobacco, or electronic cigarettes.  If you are pregnant, do not drink alcohol.  If you are breastfeeding, limit how much and how often you drink alcohol.  Limit alcohol intake to no more than 1 drink per day for nonpregnant women. One drink equals 12 ounces of beer, 5 ounces of wine, or 1 ounces of hard liquor.  Do not use street drugs.  Do not share needles.  Ask your health care provider for help if  you need support or information about quitting drugs.  Tell your health care provider if you often feel depressed.  Tell your health care provider if you have ever been abused or do not feel safe at home.   This information is not intended to replace advice given to you by your health care provider. Make sure you discuss any questions you have with your health care provider.   Document Released: 07/01/2011 Document Revised: 01/06/2015 Document Reviewed: 11/17/2013 Elsevier Interactive Patient Education 2016 [employer]. Anemia, Nonspecific Anemia is a condition in which the concentration of red blood cells or hemoglobin in the blood is below normal. Hemoglobin is a substance in red blood cells that carries oxygen to the tissues of the body. Anemia results in not enough oxygen reaching these tissues.  CAUSES  Common causes of anemia include:   Excessive bleeding. Bleeding may be internal or external. This includes excessive bleeding from periods (in women) or from the intestine.   Poor nutrition.   Chronic kidney, thyroid, and liver disease.  Bone marrow disorders that decrease red blood cell production.  Cancer and treatments for cancer.  HIV, AIDS, and their treatments.  Spleen problems that increase red blood cell destruction.  Blood disorders.  Excess destruction of red blood cells due to infection, medicines, and autoimmune disorders. SIGNS AND SYMPTOMS   Minor weakness.   Dizziness.   Headache.  Palpitations.   Shortness of breath, especially with exercise.   Paleness.  Cold sensitivity.  Indigestion.  Nausea.  Difficulty sleeping.  Difficulty concentrating. Symptoms may occur suddenly or they may develop slowly.  DIAGNOSIS  Additional blood tests are often needed. These help your health care provider determine the best treatment. Your health care provider will check your stool for blood and look for other causes of blood loss.  TREATMENT   Treatment varies depending on the cause of the anemia. Treatment can include:   Supplements of iron, vitamin B12, or  folic acid.   Hormone medicines.   A blood transfusion. This may be needed if blood loss is severe.   Hospitalization. This may be needed if there is significant continual blood loss.   Dietary changes.  Spleen removal. HOME CARE INSTRUCTIONS Keep all follow-up appointments. It often takes many weeks to correct anemia, and having your health care provider check on your condition and your response to treatment is very important. SEEK IMMEDIATE MEDICAL CARE IF:   You develop extreme weakness, shortness of breath, or chest pain.   You become dizzy or have trouble concentrating.  You develop heavy vaginal bleeding.   You develop a rash.   You have bloody or black, tarry stools.   You faint.   You vomit up blood.   You vomit repeatedly.   You have abdominal pain.  You have a fever or persistent symptoms for more than 2-3 days.   You have a fever and your symptoms suddenly get worse.   You are dehydrated.  MAKE SURE YOU:  Understand these instructions.  Will watch your condition.  Will get help right away if you are not doing well or get worse.   This information is not intended to replace advice given to you by your health care provider. Make sure you discuss any questions you have with your health care provider.   Document Released: 01/23/2005 Document Revised: 08/18/2013 Document Reviewed: 06/11/2013 Elsevier Interactive Patient Education [employer].

## 2016-03-28 NOTE — Progress Notes (Signed)
Subjective:    Patient ID: Laurie Howell, female    DOB: 29-Jan-1976, 40 y.o.   MRN: [id number]  Pt presents to the office today for CPE with pap. Pt states she is feeling extremely fatigue and states she went to the ED last night. PT reports her Hgb being 8.3 and was told to start carafate. Pt reports light headed and dizziness. Pt denies any hematuria, or blood in stools.  Gynecologic Exam Pertinent negatives include no headaches or sore throat.  Migraine  This is a chronic problem. The current episode started more than 1 year ago. The problem occurs intermittently. The problem has been unchanged. The pain is located in the frontal region. The pain does not radiate. The pain quality is similar to prior headaches. The quality of the pain is described as aching. The pain is at a severity of 9/10. The pain is mild. Associated symptoms include dizziness. Pertinent negatives include no blurred vision, coughing, ear pain, eye pain, eye redness, phonophobia, photophobia, sore throat or visual change. Nothing aggravates the symptoms. She has tried acetaminophen, darkened room and NSAIDs for the symptoms. The treatment provided mild relief.  Gastroesophageal Reflux She complains of heartburn. She reports no belching, no coughing, no sore throat or no tooth decay. This is a new problem. The current episode started in the past 7 days. The problem occurs frequently. The problem has been unchanged. The heartburn does not wake her from sleep. The symptoms are aggravated by lying down. She has tried an antacid for the symptoms. The treatment provided mild relief.  Anemia Presents for initial visit. Symptoms include bruises/bleeds easily, malaise/fatigue, pallor and palpitations. Past treatments include nothing. There is no history of alcohol abuse.      Review of Systems  Constitutional: Positive for malaise/fatigue.  HENT: Negative.  Negative for ear pain and sore throat.   Eyes: Negative.  Negative for  blurred vision, photophobia, pain and redness.  Respiratory: Negative.  Negative for cough and shortness of breath.   Cardiovascular: Positive for palpitations.  Gastrointestinal: Positive for heartburn.  Endocrine: Negative.   Genitourinary: Negative.   Musculoskeletal: Negative.   Skin: Positive for pallor.  Neurological: Positive for dizziness. Negative for headaches.  Hematological: Bruises/bleeds easily.  Psychiatric/Behavioral: Negative.   All other systems reviewed and are negative.      Objective:   Physical Exam  Constitutional: She is oriented to person, place, and time. She appears well-developed and well-nourished. No distress.  HENT:  Head: Normocephalic and atraumatic.  Right Ear: External ear normal.  Left Ear: External ear normal.  Nose: Nose normal.  Mouth/Throat: Oropharynx is clear and moist.  Eyes: Pupils are equal, round, and reactive to light.  Neck: Normal range of motion. Neck supple. No thyromegaly present.  Cardiovascular: Normal rate, regular rhythm, normal heart sounds and intact distal pulses.   No murmur heard. Pulmonary/Chest: Effort normal and breath sounds normal. No respiratory distress. She has no wheezes. Right breast exhibits no inverted nipple, no mass, no nipple discharge, no skin change and no tenderness. Left breast exhibits no inverted nipple, no mass, no nipple discharge, no skin change and no tenderness. Breasts are symmetrical.  Abdominal: Soft. Bowel sounds are normal. She exhibits no distension. There is no tenderness.  Genitourinary: Vagina normal. No vaginal discharge found.  Bimanual exam- no adnexal masses or tenderness, ovaries nonpalpable   Cervix parous and pink with erythemas circular lesions present   Musculoskeletal: Normal range of motion. She exhibits no edema or tenderness.  Neurological: She is alert and oriented to person, place, and time. She has normal reflexes. No cranial nerve deficit.  Skin: Skin is warm and dry.    Psychiatric: She has a normal mood and affect. Her behavior is normal. Judgment and thought content normal.  Vitals reviewed.     BP 105/70 mmHg  Pulse 93  Temp(Src) 98.2 F (36.8 C) (Oral)  Ht _0  (1.575 m)  Wt 117 lb 12.8 oz (53.434 kg)  BMI 21.54 kg/m2  LMP      Assessment & Plan:  1. Intractable migraine without aura and without status migrainosus - CMP14+EGFR  2. Gastroesophageal reflux disease with esophagitis -Pt started on Prilosec 20 mg today -Discussed diet - CMP14+EGFR - omeprazole (PRILOSEC) 20 MG capsule; Take 1 capsule (20 mg total) by mouth daily.  Dispense: 90 capsule; Refill: 3  3. Anemia, unspecified anemia type -Sent to hematology -Discussed starting 325 mg BID with daily stool softener - CMP14+EGFR - Pap IG, CT/NG w/ reflex HPV when ASC-U - Ambulatory referral to Hematology  4. Annual physical exam - Anemia Profile B - CMP14+EGFR - Lipid panel - Thyroid Panel With TSH - VITAMIN D 25 Hydroxy (Vit-D Deficiency, Fractures) - Pap IG, CT/NG w/ reflex HPV when ASC-U  5. Encounter for routine gynecological examination - CMP14+EGFR - Pap IG, CT/NG w/ reflex HPV when ASC-U  6. Low hemoglobin and low hematocrit - CMP14+EGFR - Fecal occult blood, imunochemical; Future - Ambulatory referral to Hematology   Continue all meds Labs pending Health Maintenance reviewed Diet and exercise encouraged RTO 6 months  Evelina Dun, FNP

## 2016-03-29 LAB — CMP14+EGFR
A/G RATIO: 1.4 (ref 1.2–2.2)
ALK PHOS: 68 IU/L (ref 39–117)
ALT: 10 IU/L (ref 0–32)
AST: 20 IU/L (ref 0–40)
Albumin: 4.3 g/dL (ref 3.5–5.5)
BUN/Creatinine Ratio: 11 (ref 8–20)
BUN: 8 mg/dL (ref 6–20)
Bilirubin Total: 0.3 mg/dL (ref 0.0–1.2)
CO2: 21 mmol/L (ref 18–29)
Calcium: 9.6 mg/dL (ref 8.7–10.2)
Chloride: 104 mmol/L (ref 96–106)
Creatinine, Ser: 0.71 mg/dL (ref 0.57–1.00)
GFR calc Af Amer: 124 mL/min/{1.73_m2} (ref >59)
GFR calc non Af Amer: 108 mL/min/{1.73_m2} (ref >59)
Globulin, Total: 3.1 g/dL (ref 1.5–4.5)
Glucose: 97 mg/dL (ref 65–99)
POTASSIUM: 4 mmol/L (ref 3.5–5.2)
Sodium: 140 mmol/L (ref 134–144)
Total Protein: 7.4 g/dL (ref 6.0–8.5)

## 2016-03-29 LAB — ANEMIA PROFILE B
Basophils Absolute: 0 10*3/uL (ref 0.0–0.2)
Basos: 1 %
EOS (ABSOLUTE): 0.1 10*3/uL (ref 0.0–0.4)
Eos: 2 %
Ferritin: 3 ng/mL — ABNORMAL LOW (ref 15–150)
Folate: 8.6 ng/mL (ref >3.0)
Hematocrit: 28.9 % — ABNORMAL LOW (ref 34.0–46.6)
Hemoglobin: 8.9 g/dL — CL (ref 11.1–15.9)
Immature Grans (Abs): 0 10*3/uL (ref 0.0–0.1)
Immature Granulocytes: 0 %
Iron Saturation: 4 % — CL (ref 15–55)
Iron: 17 ug/dL — ABNORMAL LOW (ref 27–159)
Lymphocytes Absolute: 1.3 10*3/uL (ref 0.7–3.1)
Lymphs: 23 %
MCH: 21.4 pg — ABNORMAL LOW (ref 26.6–33.0)
MCHC: 30.8 g/dL — ABNORMAL LOW (ref 31.5–35.7)
MCV: 70 fL — ABNORMAL LOW (ref 79–97)
Monocytes Absolute: 0.4 10*3/uL (ref 0.1–0.9)
Monocytes: 7 %
Neutrophils Absolute: 3.9 10*3/uL (ref 1.4–7.0)
Neutrophils: 67 %
Platelets: 362 10*3/uL (ref 150–379)
RBC: 4.16 x10E6/uL (ref 3.77–5.28)
RDW: 16.9 % — ABNORMAL HIGH (ref 12.3–15.4)
Retic Ct Pct: 0.8 % (ref 0.6–2.6)
Total Iron Binding Capacity: 458 ug/dL — ABNORMAL HIGH (ref 250–450)
UIBC: 441 ug/dL — ABNORMAL HIGH (ref 131–425)
Vitamin B-12: 690 pg/mL (ref 211–946)
WBC: 5.8 10*3/uL (ref 3.4–10.8)

## 2016-03-29 LAB — VITAMIN D 25 HYDROXY (VIT D DEFICIENCY, FRACTURES): VIT D 25 HYDROXY: 64 ng/mL (ref 30.0–100.0)

## 2016-03-29 LAB — LIPID PANEL
CHOLESTEROL TOTAL: 130 mg/dL (ref 100–199)
Chol/HDL Ratio: 2.7 ratio units (ref 0.0–4.4)
HDL: 49 mg/dL (ref >39)
LDL Calculated: 69 mg/dL (ref 0–99)
TRIGLYCERIDES: 62 mg/dL (ref 0–149)
VLDL Cholesterol Cal: 12 mg/dL (ref 5–40)

## 2016-03-29 LAB — THYROID PANEL WITH TSH
Free Thyroxine Index: 1.9 (ref 1.2–4.9)
T3 Uptake Ratio: 26 % (ref 24–39)
T4, Total: 7.4 ug/dL (ref 4.5–12.0)
TSH: 0.696 u[IU]/mL (ref 0.450–4.500)

## 2016-04-01 LAB — PAP IG, CT-NG, RFX HPV ASCU
Chlamydia, Nuc. Acid Amp: NEGATIVE
GONOCOCCUS BY NUCLEIC ACID AMP: NEGATIVE
PAP Smear Comment: 0

## 2016-04-11 ENCOUNTER — Encounter: Payer: Self-pay | Admitting: Family Medicine

## 2016-04-11 ENCOUNTER — Ambulatory Visit (INDEPENDENT_AMBULATORY_CARE_PROVIDER_SITE_OTHER): Payer: Medicaid Other | Admitting: Family Medicine

## 2016-04-11 VITALS — BP 113/79 | HR 101 | Temp 97.7°F | Ht 62.0 in | Wt 117.0 lb

## 2016-04-11 DIAGNOSIS — N970 Female infertility associated with anovulation: Secondary | ICD-10-CM | POA: Diagnosis not present

## 2016-04-11 DIAGNOSIS — N921 Excessive and frequent menstruation with irregular cycle: Secondary | ICD-10-CM | POA: Diagnosis not present

## 2016-04-11 LAB — PREGNANCY, URINE: Preg Test, Ur: NEGATIVE

## 2016-04-11 LAB — FINGERSTICK HEMOGLOBIN: Hemoglobin: 9.9 g/dL — ABNORMAL LOW (ref 11.1–15.9)

## 2016-04-11 MED ORDER — MEGESTROL ACETATE 40 MG PO TABS: 40.0000 mg | ORAL_TABLET | Freq: Every day | ORAL | Status: DC

## 2016-04-11 NOTE — Patient Instructions (Signed)
Great to meet you!  I am referring you to GYN< I believe you will need an endometrial biopsy  Start megace 1 pill daily until you see them   Abnormal Uterine Bleeding Abnormal uterine bleeding can affect women at various stages in life, including teenagers, women in their reproductive years, pregnant women, and women who have reached menopause. Several kinds of uterine bleeding are considered abnormal, including:  Bleeding or spotting between periods.   Bleeding after sexual intercourse.   Bleeding that is heavier or more than normal.   Periods that last longer than usual.  Bleeding after menopause.  Many cases of abnormal uterine bleeding are minor and simple to treat, while others are more serious. Any type of abnormal bleeding should be evaluated by your health care provider. Treatment will depend on the cause of the bleeding. HOME CARE INSTRUCTIONS Monitor your condition for any changes. The following actions may help to alleviate any discomfort you are experiencing:  Avoid the use of tampons and douches as directed by your health care provider.  Change your pads frequently. You should get regular pelvic exams and Pap tests. Keep all follow-up appointments for diagnostic tests as directed by your health care provider.  SEEK MEDICAL CARE IF:   Your bleeding lasts more than 1 week.   You feel dizzy at times.  SEEK IMMEDIATE MEDICAL CARE IF:   You pass out.   You are changing pads every 15 to 30 minutes.   You have abdominal pain.  You have a fever.   You become sweaty or weak.   You are passing large blood clots from the vagina.   You start to feel nauseous and vomit. MAKE SURE YOU:   Understand these instructions.  Will watch your condition.  Will get help right away if you are not doing well or get worse.   This information is not intended to replace advice given to you by your health care provider. Make sure you discuss any questions you have  with your health care provider.   Document Released: 12/16/2005 Document Revised: 12/21/2013 Document Reviewed: 07/15/2013 Elsevier Interactive Patient Education [employer].

## 2016-04-11 NOTE — Progress Notes (Signed)
   HPI  Patient presents today here with heavy periods and dizziness.  Patient explains that over the last 6-7 days she's had heavy vaginal bleeding. She's been using 5-6 tampons daily. Her hemoglobin was checked 2 weeks ago and found to be 8.9. She was also iron deficient, she's taking iron pills currently.  She states that she is getting dizzy when she stands up and feels very weak.  She has a long history of irregular heavy periods. She states that since her 40 year old son was born she's had heavy periods that were irregular   He also complains of abdominal distention intermittently, she states that at times her abdomen is very full feeling" like she is pregnant"  PMH: Smoking status noted ROS: Per HPI  Objective: BP 113/79 mmHg  Pulse 101  Temp(Src) 97.7 F (36.5 C) (Oral)  Ht 5\' 2"  (1.575 m)  Wt 117 lb (53.071 kg)  BMI 21.39 kg/m2 Gen: NAD, alert, cooperative with exam HEENT: NCAT. Conjunctiva  CV: RRR, good S1/S2, no murmur Resp: CTABL, no wheezes, non-labored Abd: SNTND, BS present, no guarding or organomegaly, Slightly distended  Ext: No edema, warm Neuro: Alert and oriented, No gross deficits   Hgb 9.9 - POC U preg negative  Assessment and plan:  # Menorrhagia, chronic anovulatory bleeding, iron deficiency anemia At risk for Endometrial cancer, also at risk for severe anemia requiring transfusion Megace for bleeding, explained she needs an EMB and iron replacemnet for iron deficiency  anemia Refer to [facility] GYN RTC with any concerns    Meds ordered this encounter  Medications  . Ferrous Gluconate (IRON) 240 (27 Fe) MG TABS    Sig: Take by mouth.  . megestrol (MEGACE) 40 MG tablet    Sig: Take 1 tablet (40 mg total) by mouth daily.    Dispense:  30 tablet    Refill:  0    Laroy Apple, MD [facility] 04/11/2016, 2:13 PM

## 2016-04-15 ENCOUNTER — Encounter (HOSPITAL_COMMUNITY): Payer: Medicaid Other | Attending: Hematology & Oncology | Admitting: Hematology & Oncology

## 2016-04-15 ENCOUNTER — Encounter (HOSPITAL_COMMUNITY): Payer: Self-pay | Admitting: Hematology & Oncology

## 2016-04-15 VITALS — BP 119/75 | HR 73 | Temp 98.3°F | Wt 117.5 lb

## 2016-04-15 DIAGNOSIS — N920 Excessive and frequent menstruation with regular cycle: Secondary | ICD-10-CM | POA: Insufficient documentation

## 2016-04-15 DIAGNOSIS — D509 Iron deficiency anemia, unspecified: Secondary | ICD-10-CM | POA: Diagnosis present

## 2016-04-15 DIAGNOSIS — Z79899 Other long term (current) drug therapy: Secondary | ICD-10-CM | POA: Insufficient documentation

## 2016-04-15 DIAGNOSIS — Z9889 Other specified postprocedural states: Secondary | ICD-10-CM | POA: Insufficient documentation

## 2016-04-15 DIAGNOSIS — Z87891 Personal history of nicotine dependence: Secondary | ICD-10-CM | POA: Insufficient documentation

## 2016-04-15 DIAGNOSIS — N921 Excessive and frequent menstruation with irregular cycle: Secondary | ICD-10-CM

## 2016-04-15 MED ORDER — IRON POLYSACCH CMPLX-B12-FA 150-0.025-1 MG PO CAPS: 1.0000 mg | ORAL_CAPSULE | Freq: Once | ORAL | Status: DC

## 2016-04-15 NOTE — Progress Notes (Signed)
[place]  Progress Note  Patient Care Team: Sharion Balloon, FNP as PCP - General (Family Medicine)  CHIEF COMPLAINTS/PURPOSE OF CONSULTATION:  Iron deficiency Anemia  HISTORY OF PRESENTING ILLNESS:  Laurie Howell 40 y.o. female is here because of Anemia and Iron deficiency. She sees Dr. Wendi Snipes for primary care and is noted to have menorrhagia, chronic anovulatory bleeding, iron deficiency anemia. She has been referred to [place] for further management. She has also been referred to [facility].  Ms. Lenderman was here with her ex-husband today.  She has been having problems with her cycle since her last child was born 48 years ago. She had pre-term labor when she was 3 and a half months pregnant, and since then she had problems until she delivered him. She had no problems with delivery.   She has very heavy periods that are irregular. She says that sometimes she will have 2 days without a period and sometimes have a period for a whole month. She only uses super plus tampons. She says that she uses 4 or 5 every hour now for about a month. She says that her periods stay pretty heavy. She had Depo Provera injections but these made her bleeding worse. She says that sometimes she feels like she is going to pass out and has occasionally.   She says that sometimes her stomach swells so much that she looks like she is pregnant.   She has not had a hysterectomy and had her tubes untied 2 years ago because she was planning on having another child until she and her husband decided to separate.   She recently started taking iron pills once a day but she says that these do not help and upset her stomach.  She does not eat or crave ice, she just stays thirsty. She notes frequent headaches.   She states that she has leg cramps occasionally.   She gets kidney infections every so often. She is allergic to Aspirin and IV contrast. She is still able to take Advil.   MEDICAL HISTORY:    Past Medical History  Diagnosis Date  . Esophageal stricture   . Headache   . Anemia   . Frequent UTI     SURGICAL HISTORY: Past Surgical History  Procedure Laterality Date  . Tubal ligation  1997  . Tubal reversal  02/2015    [facility] - Dr Karie Kirks    SOCIAL HISTORY: Social History   Social History  . Marital Status: Legally Separated    Spouse Name: N/A  . Number of Children: 2  . Years of Education: 8th   Occupational History  . N/A    Social History Main Topics  . Smoking status: Former Smoker    Types: Cigarettes    Quit date: 12/30/1994  . Smokeless tobacco: Never Used  . Alcohol Use: No  . Drug Use: No  . Sexual Activity: Yes    Birth Control/ Protection: None   Other Topics Concern  . Not on file   Social History Narrative  Divorced 2 children Doesn't work; is on disability Doesn't smoke Rare ETOH use No hobbies Mother living at 87. Had open heart surgery 2 years ago Father passed at 38 from massive heart attack 2 sisters (Both have anemia problems)  FAMILY HISTORY: Family History  Problem Relation Age of Onset  . Depression Mother   . Hypertension Mother   . Heart disease Father   . Hypertension Father   .  Anxiety disorder Father   . Heart attack Father     ALLERGIES:  is allergic to aspirin and ivp dye.  MEDICATIONS:  Current Outpatient Prescriptions  Medication Sig Dispense Refill  . Ferrous Gluconate (IRON) 240 (27 Fe) MG TABS Take by mouth.    . lansoprazole (PREVACID) 15 MG capsule Take 15 mg by mouth daily at 12 noon.    . megestrol (MEGACE) 40 MG tablet Take 1 tablet (40 mg total) by mouth daily. 30 tablet 0  . Iron Polysacch Cmplx-B12-FA 150-0.025-1 MG CAPS Take 1 mg by mouth once. 30 each 3   Current Facility-Administered Medications  Medication Dose Route Frequency Provider Last Rate Last Dose  . medroxyPROGESTERone (DEPO-PROVERA) injection 150 mg  150 mg Intramuscular Q90 days Sharion Balloon, FNP   150 mg at 01/11/16 1353     Review of Systems  Constitutional: Positive for malaise/fatigue. Negative for fever, chills and weight loss.  HENT: Negative for congestion, hearing loss, nosebleeds, sore throat and tinnitus.   Eyes: Negative.  Negative for blurred vision, double vision, pain and discharge.  Respiratory: Negative.  Negative for cough, hemoptysis, sputum production, shortness of breath and wheezing.   Cardiovascular: Negative.  Negative for chest pain, palpitations, claudication, leg swelling and PND.  Gastrointestinal: Negative.  Negative for heartburn, nausea, vomiting, abdominal pain, diarrhea, constipation, blood in stool and melena.       Bloating.  Genitourinary: Negative.  Negative for dysuria, urgency, frequency and hematuria.       Heavy periods  Musculoskeletal: Negative.  Negative for myalgias, joint pain and falls.       Has leg cramps occasionally.  Skin: Negative.  Negative for itching and rash.  Neurological: Positive for weakness and headaches. Negative for dizziness, tingling, tremors, sensory change, speech change, focal weakness, seizures and loss of consciousness.  Endo/Heme/Allergies: Negative.  Does not bruise/bleed easily.  Psychiatric/Behavioral: Negative for depression, suicidal ideas, memory loss and substance abuse. The patient has insomnia. The patient is not nervous/anxious.        Cannot sleep. Has gone 5 days without sleeping before.  All other systems reviewed and are negative.  14 point ROS was done and is otherwise as detailed above or in HPI   PHYSICAL EXAMINATION: ECOG PERFORMANCE STATUS: 1 - Symptomatic but completely ambulatory  Filed Vitals:   04/15/16 1343  BP: 119/75  Pulse: 73  Temp: 98.3 F (36.8 C)   Filed Weights   04/15/16 1343  Weight: 117 lb 8 oz (53.298 kg)    Physical Exam  Constitutional: She is oriented to person, place, and time and well-developed, well-nourished, and in no distress.  HENT:  Head: Normocephalic and atraumatic.  Nose:  Nose normal.  Mouth/Throat: Oropharynx is clear and moist. No oropharyngeal exudate.  Eyes: Conjunctivae and EOM are normal. Pupils are equal, round, and reactive to light. Right eye exhibits no discharge. Left eye exhibits no discharge. No scleral icterus.  Neck: Normal range of motion. Neck supple. No tracheal deviation present. No thyromegaly present.  Cardiovascular: Normal rate, regular rhythm and normal heart sounds.  Exam reveals no gallop and no friction rub.   No murmur heard. Pulmonary/Chest: Effort normal and breath sounds normal. She has no wheezes. She has no rales.  Abdominal: Soft. Bowel sounds are normal. She exhibits no distension and no mass. There is no tenderness. There is no rebound and no guarding.  Musculoskeletal: Normal range of motion. She exhibits no edema.  Lymphadenopathy:    She has no cervical adenopathy.  Neurological: She is alert and oriented to person, place, and time. She has normal reflexes. No cranial nerve deficit. Gait normal. Coordination normal.  Skin: Skin is warm and dry. No rash noted.  Psychiatric: Mood, memory, affect and judgment normal.  Nursing note and vitals reviewed.   LABORATORY DATA:  I have reviewed the data as listed Results for ADRAIN, BUTRICK (MRN [id number]) as of 04/15/2016 13:30  Ref. Range 03/28/2016 12:50 04/11/2016 14:17 04/11/2016 14:22  Sodium Latest Ref Range: 134-144 mmol/L 140    Potassium Latest Ref Range: 3.5-5.2 mmol/L 4.0    Chloride Latest Ref Range: 96-106 mmol/L 104    CO2 Latest Ref Range: 18-29 mmol/L 21    BUN Latest Ref Range: 6-20 mg/dL 8    Creatinine Latest Ref Range: 0.57-1.00 mg/dL 0.71    Calcium Latest Ref Range: 8.7-10.2 mg/dL 9.6    EGFR (Non-African Amer.) Latest Ref Range: >59 mL/min/1.73 108    EGFR (African American) Latest Ref Range: >59 mL/min/1.73 124    Glucose Latest Ref Range: 65-99 mg/dL 97    BUN/Creatinine Ratio Latest Ref Range: 8-20  11    Alkaline Phosphatase Latest Ref Range: 39-117  IU/L 68    Albumin Latest Ref Range: 3.5-5.5 g/dL 4.3    Albumin/Globulin Ratio Latest Ref Range: 1.2-2.2  1.4    AST Latest Ref Range: 0-40 IU/L 20    ALT Latest Ref Range: 0-32 IU/L 10    Total Bilirubin Latest Ref Range: 0.0-1.2 mg/dL 0.3    Cholesterol, Total Latest Ref Range: 100-199 mg/dL 130    Triglycerides Latest Ref Range: 0-149 mg/dL 62    HDL Cholesterol Latest Ref Range: >39 mg/dL 49    LDL (calc) Latest Ref Range: 0-99 mg/dL 69    Total CHOL/HDL Ratio Latest Ref Range: 0.0-4.4 ratio units 2.7    VLDL Cholesterol Cal Latest Ref Range: 5-40 mg/dL 12    Iron Latest Ref Range: 27-159 ug/dL 17 (L)    UIBC Latest Ref Range: 131-425 ug/dL 441 (H)    TIBC Latest Ref Range: 250-450 ug/dL 458 (H)    Ferritin Latest Ref Range: 15-150 ng/mL 3 (L)    Iron Saturation Latest Ref Range: 15-55 % 4 (LL)    Folate Latest Ref Range: >3.0 ng/mL 8.6    Vitamin D, 25-Hydroxy Latest Ref Range: 30.0-100.0 ng/mL 64.0    Vitamin B12 Latest Ref Range: 211-946 pg/mL 690    Globulin, Total Latest Ref Range: 1.5-4.5 g/dL 3.1    WBC Latest Ref Range: 3.4-10.8 x10E3/uL 5.8    RBC Latest Ref Range: 3.77-5.28 x10E6/uL 4.16    Hemoglobin Latest Ref Range: 11.1-15.9 g/dL 8.9 (<) 9.9 (L)   HCT Latest Ref Range: 34.0-46.6 % 28.9 (L)    MCV Latest Ref Range: 79-97 fL 70 (L)    MCH Latest Ref Range: 26.6-33.0 pg 21.4 (L)    MCHC Latest Ref Range: 31.5-35.7 g/dL 30.8 (L)    RDW Latest Ref Range: 12.3-15.4 % 16.9 (H)    Platelets Latest Ref Range: 150-379 x10E3/uL 362    NEUT# Latest Ref Range: 1.4-7.0 x10E3/uL 3.9    Neutrophils Latest Units: % 67    Lymphocyte # Latest Ref Range: 0.7-3.1 x10E3/uL 1.3    Monocytes Absolute Latest Ref Range: 0.1-0.9 x10E3/uL 0.4    Basophils Absolute Latest Ref Range: 0.0-0.2 x10E3/uL 0.0    Immature Granulocytes Latest Units: % 0    Immature Grans (Abs) Latest Ref Range: 0.0-0.1 x10E3/uL 0.0    Lymphs Latest Units: %  23    Monocytes Latest Units: % 7    Basos Latest  Units: % 1    Retic Ct Pct Latest Ref Range: 0.6-2.6 % 0.8    Eos Latest Units: % 2    EOS (ABSOLUTE) Latest Ref Range: 0.0-0.4 x10E3/uL 0.1    Preg Test, Ur Latest Ref Range: Negative    Negative  TSH Latest Ref Range: 0.450-4.500 uIU/mL 0.696    Thyroxine (T4) Latest Ref Range: 4.5-12.0 ug/dL 7.4    Free Thyroxine Index Latest Ref Range: 1.2-4.9  1.9    T3 Uptake Ratio Latest Ref Range: 24-39 % 26    NOTE: Unknown Comment    DIAGNOSIS: Unknown Comment    Specimen Adequacy Unknown Comment    Chlamydia, Nuc. Acid Amp Latest Ref Range: Negative  Negative    CLINICIAN PROVIDED ICD10: Unknown Comment    Gonococcus by Nucleic Acid Amp Latest Ref Range: Negative  Negative    PAP IG, CT-NG, RFX HPV ASCU Unknown Rpt    PAP REFLEX: Unknown Comment    PAP SMEAR COMMENT Unknown .    Performed by Unknown Comment    Total Protein Latest Ref Range: 6.0-8.5 g/dL 7.4    Test Methodology Unknown Comment      ASSESSMENT & PLAN:  Iron deficiency Anemia Menorrhagia Intolerance to oral iron  We discussed that based upon her iron studies, including ferritin, heavy cycles and microcytic anemia that the cause of her anemia is iron deficiency.  I have recommended replacing her iron IV and changing her to another form of oral iron (niferex forte) to see if she may tolerate it better. I do not feel a need currently to pursue additional anemia evaluation. I advised her that if her counts do not improve with appropriate iron replacement we can consider additional workup.  She has been strongly encouraged to keep her appointment at OB/GYN as until her cycles are regulated this will be an ongoing difficult issue to manage.  The most likely cause of her anemia is due to chronic blood loss/malabsorption syndrome. We discussed some of the risks, benefits, and alternatives of intravenous iron infusions. The patient is symptomatic from anemia and the iron level is critically low. She tolerated oral iron supplement  poorly and desires to achieved higher levels of iron faster for adequate hematopoesis. Some of the side-effects to be expected including risks of infusion reactions, phlebitis, headaches, nausea and fatigue.  The patient is willing to proceed. Patient education material was dispensed.  Goal is to keep ferritin level greater than or equal to 100 ng/ml  She will also receive a Injectafer IV on Wednesday 04/17/16.  I will formally see her back in 4 weeks. Repeat CBC will be in 2 weeks.   Orders Placed This Encounter  Procedures  . CBC with Differential    Standing Status: Future     Number of Occurrences:      Standing Expiration Date: 04/15/2017  . CBC with Differential    Standing Status: Future     Number of Occurrences:      Standing Expiration Date: 04/15/2017  . Reticulocytes    Standing Status: Future     Number of Occurrences:      Standing Expiration Date: 04/15/2017    All questions were answered. The patient knows to call the clinic with any problems, questions or concerns.  This document serves as a record of services personally performed by Ancil Linsey, MD. It was created on her behalf by Midge Minium  Deigan, a trained medical scribe. The creation of this record is based on the scribe's personal observations and the provider's statements to them. This document has been checked and approved by the attending provider.  I have reviewed the above documentation for accuracy and completeness, and I agree with the above.  This note was electronically signed.    Molli Hazard, MD  04/15/2016 3:02 PM

## 2016-04-15 NOTE — Patient Instructions (Signed)
[place] at [facility] Discharge Instructions  RECOMMENDATIONS MADE BY THE CONSULTANT AND ANY TEST RESULTS WILL BE SENT TO YOUR REFERRING PHYSICIAN.  Exam done and seen today by Dr. Whitney Muse She is starting you on Niferex Forte 1 mg once a day. Needs to get Injectafer this week hopefully by Thursday. Labs in one week, and in 2 weeks Return to see the doctor in 2 weeks Please call the clinic if you have any questions or concerns  Thank you for choosing [place] at [facility] to provide your oncology and hematology care.  To afford each patient quality time with our provider, please arrive at least 15 minutes before your scheduled appointment time.   Beginning January 23rd 2017 lab work for the [facility] will be done in the  Main lab at [facility] on 1st floor. If you have a lab appointment with the [facility] please come in thru the  Main Entrance and check in at the main information desk  You need to re-schedule your appointment should you arrive 10 or more minutes late.  We strive to give you quality time with our providers, and arriving late affects you and other patients whose appointments are after yours.  Also, if you no show three or more times for appointments you may be dismissed from the clinic at the providers discretion.     Again, thank you for choosing [facility]  Our hope is that these requests will decrease the amount of time that you wait before being seen by our physicians.       _____________________________________________________________  Should you have questions after your visit to [facility], please contact our office at [phone] between the hours of 8:30 a.m. and 4:30 p.m.  Voicemails left after 4:30 p.m. will not be returned until the following business day.  For prescription refill requests, have your pharmacy contact our office.         Resources For Cancer Patients  and their Caregivers ? American Cancer Society: Can assist with transportation, wigs, general needs, runs Look Good Feel Better.        631-422-1074 ? Cancer Care: Provides financial assistance, online support groups, medication/co-pay assistance.  1-800-813-HOPE 703-865-3157) ? [facility] Assists [place] cancer patients and their families through emotional , educational and financial support.  339-602-5538 ? [place] DSS Where to apply for food stamps, Medicaid and utility assistance. (340)630-5047 ? [facility]: Transportation to medical appointments. (321)316-5429 ? Social Security Administration: May apply for disability if have a Stage IV cancer. (938) 008-1301 7863959140 ? [employer], Disability and Transit Services: Assists with nutrition, care and transit needs. 640-580-0434

## 2016-04-16 ENCOUNTER — Encounter (HOSPITAL_COMMUNITY): Payer: Self-pay | Admitting: Hematology & Oncology

## 2016-04-17 ENCOUNTER — Encounter (HOSPITAL_BASED_OUTPATIENT_CLINIC_OR_DEPARTMENT_OTHER): Payer: Medicaid Other

## 2016-04-17 ENCOUNTER — Encounter (HOSPITAL_COMMUNITY): Payer: Self-pay

## 2016-04-17 DIAGNOSIS — N921 Excessive and frequent menstruation with irregular cycle: Secondary | ICD-10-CM | POA: Diagnosis not present

## 2016-04-17 DIAGNOSIS — D509 Iron deficiency anemia, unspecified: Secondary | ICD-10-CM | POA: Diagnosis present

## 2016-04-17 MED ORDER — SODIUM CHLORIDE 0.9 % IV SOLN
Freq: Once | INTRAVENOUS | Status: AC
  Administered 2016-04-17: 14:00:00 via INTRAVENOUS

## 2016-04-17 MED ORDER — SODIUM CHLORIDE 0.9 % IV SOLN
Freq: Once | INTRAVENOUS | Status: AC
  Administered 2016-04-17: 14:00:00 via INTRAVENOUS
  Filled 2016-04-17: qty 15

## 2016-04-17 MED ORDER — SODIUM CHLORIDE 0.9% FLUSH
10.0000 mL | Freq: Once | INTRAVENOUS | Status: AC
  Administered 2016-04-17: 10 mL via INTRAVENOUS

## 2016-04-17 NOTE — Progress Notes (Signed)
Tolerated iron infusion well. Ambulatory on discharge home to self. 

## 2016-04-22 ENCOUNTER — Encounter (HOSPITAL_COMMUNITY): Payer: Medicaid Other

## 2016-04-22 DIAGNOSIS — Z79899 Other long term (current) drug therapy: Secondary | ICD-10-CM | POA: Diagnosis not present

## 2016-04-22 DIAGNOSIS — D509 Iron deficiency anemia, unspecified: Secondary | ICD-10-CM

## 2016-04-22 DIAGNOSIS — Z9889 Other specified postprocedural states: Secondary | ICD-10-CM | POA: Diagnosis not present

## 2016-04-22 DIAGNOSIS — Z87891 Personal history of nicotine dependence: Secondary | ICD-10-CM | POA: Diagnosis not present

## 2016-04-22 DIAGNOSIS — N920 Excessive and frequent menstruation with regular cycle: Secondary | ICD-10-CM | POA: Diagnosis not present

## 2016-04-22 LAB — CBC WITH DIFFERENTIAL/PLATELET
BASOS ABS: 0.1 10*3/uL (ref 0.0–0.1)
Basophils Relative: 1 %
Eosinophils Absolute: 0.2 10*3/uL (ref 0.0–0.7)
Eosinophils Relative: 4 %
HEMATOCRIT: 31.8 % — AB (ref 36.0–46.0)
Hemoglobin: 9.8 g/dL — ABNORMAL LOW (ref 12.0–15.0)
LYMPHS ABS: 1.5 10*3/uL (ref 0.7–4.0)
Lymphocytes Relative: 26 %
MCH: 22.5 pg — ABNORMAL LOW (ref 26.0–34.0)
MCHC: 30.8 g/dL (ref 30.0–36.0)
MCV: 73.1 fL — ABNORMAL LOW (ref 78.0–100.0)
MONO ABS: 0.5 10*3/uL (ref 0.1–1.0)
Monocytes Relative: 9 %
Neutro Abs: 3.5 10*3/uL (ref 1.7–7.7)
Neutrophils Relative %: 60 %
PLATELETS: 334 10*3/uL (ref 150–400)
RBC: 4.35 MIL/uL (ref 3.87–5.11)
RDW: 20.8 % — AB (ref 11.5–15.5)
WBC: 5.8 10*3/uL (ref 4.0–10.5)

## 2016-04-25 ENCOUNTER — Encounter: Payer: Medicaid Other | Admitting: Obstetrics & Gynecology

## 2016-04-29 ENCOUNTER — Encounter (HOSPITAL_COMMUNITY): Payer: Medicaid Other | Attending: Hematology & Oncology | Admitting: Hematology & Oncology

## 2016-04-29 ENCOUNTER — Encounter (HOSPITAL_COMMUNITY): Payer: Medicaid Other

## 2016-04-29 ENCOUNTER — Encounter (HOSPITAL_COMMUNITY): Payer: Self-pay | Admitting: Hematology & Oncology

## 2016-04-29 VITALS — BP 103/61 | HR 80 | Temp 97.8°F | Resp 16 | Wt 119.0 lb

## 2016-04-29 DIAGNOSIS — N921 Excessive and frequent menstruation with irregular cycle: Secondary | ICD-10-CM

## 2016-04-29 DIAGNOSIS — Z9851 Tubal ligation status: Secondary | ICD-10-CM | POA: Insufficient documentation

## 2016-04-29 DIAGNOSIS — Z79899 Other long term (current) drug therapy: Secondary | ICD-10-CM | POA: Insufficient documentation

## 2016-04-29 DIAGNOSIS — D509 Iron deficiency anemia, unspecified: Secondary | ICD-10-CM | POA: Insufficient documentation

## 2016-04-29 DIAGNOSIS — Z8249 Family history of ischemic heart disease and other diseases of the circulatory system: Secondary | ICD-10-CM | POA: Insufficient documentation

## 2016-04-29 DIAGNOSIS — Z87891 Personal history of nicotine dependence: Secondary | ICD-10-CM | POA: Insufficient documentation

## 2016-04-29 DIAGNOSIS — N920 Excessive and frequent menstruation with regular cycle: Secondary | ICD-10-CM | POA: Insufficient documentation

## 2016-04-29 DIAGNOSIS — Z9889 Other specified postprocedural states: Secondary | ICD-10-CM | POA: Diagnosis not present

## 2016-04-29 DIAGNOSIS — K222 Esophageal obstruction: Secondary | ICD-10-CM | POA: Insufficient documentation

## 2016-04-29 LAB — CBC WITH DIFFERENTIAL/PLATELET
BASOS ABS: 0.1 10*3/uL (ref 0.0–0.1)
Basophils Relative: 1 %
Eosinophils Absolute: 0.2 10*3/uL (ref 0.0–0.7)
Eosinophils Relative: 3 %
HEMATOCRIT: 34.7 % — AB (ref 36.0–46.0)
HEMOGLOBIN: 10.9 g/dL — AB (ref 12.0–15.0)
Lymphocytes Relative: 32 %
Lymphs Abs: 1.7 10*3/uL (ref 0.7–4.0)
MCH: 24.3 pg — ABNORMAL LOW (ref 26.0–34.0)
MCHC: 31.4 g/dL (ref 30.0–36.0)
MCV: 77.3 fL — AB (ref 78.0–100.0)
Monocytes Absolute: 0.3 10*3/uL (ref 0.1–1.0)
Monocytes Relative: 6 %
NEUTROS ABS: 2.9 10*3/uL (ref 1.7–7.7)
Neutrophils Relative %: 58 %
Platelets: 326 10*3/uL (ref 150–400)
RBC: 4.49 MIL/uL (ref 3.87–5.11)
RDW: 26 % — ABNORMAL HIGH (ref 11.5–15.5)
WBC: 5.2 10*3/uL (ref 4.0–10.5)

## 2016-04-29 LAB — RETICULOCYTES
RBC.: 4.49 MIL/uL (ref 3.87–5.11)
RETIC COUNT ABSOLUTE: 85.3 10*3/uL (ref 19.0–186.0)
Retic Ct Pct: 1.9 % (ref 0.4–3.1)

## 2016-04-29 NOTE — Progress Notes (Signed)
[name]  Progress Note  Patient Care Team: Sharion Balloon, FNP as PCP - General (Family Medicine)  CHIEF COMPLAINTS/PURPOSE OF CONSULTATION:  Iron deficiency Anemia  HISTORY OF PRESENTING ILLNESS:  Laurie Howell 40 y.o. female is here because of Anemia and Iron deficiency. She sees Dr. Wendi Snipes for primary care and is noted to have menorrhagia, chronic anovulatory bleeding, iron deficiency anemia. She has been referred to [place] for further management. She has also been referred to [facility] OB[facility].   Laurie Howell was here alone today.  She is still feels tired and dizzy and still gets headaches. But notes that her fatigue is mildly improved.   She is still bleeding.  She has not slept since Tuesday because her uncle recently passed away with "colon and kidney cancer."   She had no problems with the infusion. She received one dose of injectafer on 4/19.   She goes to see her OB/GYN doctor on Thursday.   MEDICAL HISTORY:  Past Medical History  Diagnosis Date  . Esophageal stricture   . Headache   . Anemia   . Frequent UTI     SURGICAL HISTORY: Past Surgical History  Procedure Laterality Date  . Tubal ligation  1997  . Tubal reversal  02/2015    [facility] - Dr Karie Kirks    SOCIAL HISTORY: Social History   Social History  . Marital Status: Legally Separated    Spouse Name: N/A  . Number of Children: 2  . Years of Education: 8th   Occupational History  . N/A    Social History Main Topics  . Smoking status: Former Smoker    Types: Cigarettes    Quit date: 12/30/1994  . Smokeless tobacco: Never Used  . Alcohol Use: No  . Drug Use: No  . Sexual Activity: Yes    Birth Control/ Protection: None   Other Topics Concern  . Not on file   Social History Narrative  Divorced 2 children Doesn't work; is on disability Doesn't smoke Rare ETOH use No hobbies Mother living at 48. Had open heart surgery 2 years ago Father passed at 22 from massive heart  attack 2 sisters (Both have anemia problems)  FAMILY HISTORY: Family History  Problem Relation Age of Onset  . Depression Mother   . Hypertension Mother   . Heart disease Father   . Hypertension Father   . Anxiety disorder Father   . Heart attack Father     ALLERGIES:  is allergic to aspirin and ivp dye.  MEDICATIONS:  Current Outpatient Prescriptions  Medication Sig Dispense Refill  . Iron Polysacch Cmplx-B12-FA 150-0.025-1 MG CAPS Take 1 mg by mouth once. 30 each 3  . lansoprazole (PREVACID) 15 MG capsule Take 15 mg by mouth daily at 12 noon.    . megestrol (MEGACE) 40 MG tablet Take 1 tablet (40 mg total) by mouth daily. (Patient not taking: Reported on 04/29/2016) 30 tablet 0   Current Facility-Administered Medications  Medication Dose Route Frequency Provider Last Rate Last Dose  . medroxyPROGESTERone (DEPO-PROVERA) injection 150 mg  150 mg Intramuscular Q90 days Sharion Balloon, FNP   150 mg at 01/11/16 1353    Review of Systems  Constitutional: Positive for malaise/fatigue. Negative for fever, chills and weight loss.  HENT: Negative for congestion, hearing loss, nosebleeds, sore throat and tinnitus.   Eyes: Negative.  Negative for blurred vision, double vision, pain and discharge.  Respiratory: Negative.  Negative for cough, hemoptysis, sputum production, shortness  of breath and wheezing.   Cardiovascular: Negative.  Negative for chest pain, palpitations, claudication, leg swelling and PND.  Gastrointestinal: Negative.  Negative for heartburn, nausea, vomiting, abdominal pain, diarrhea, constipation, blood in stool and melena.       Bloating.  Genitourinary: Negative.  Negative for dysuria, urgency, frequency and hematuria.       Heavy periods  Musculoskeletal: Negative.  Negative for myalgias, joint pain and falls.       Has leg cramps occasionally.  Skin: Negative.  Negative for itching and rash.  Neurological: Positive for dizziness, weakness and headaches. Negative  for tingling, tremors, sensory change, speech change, focal weakness, seizures and loss of consciousness.  Endo/Heme/Allergies: Negative.  Does not bruise/bleed easily.  Psychiatric/Behavioral: Negative for depression, suicidal ideas, memory loss and substance abuse. The patient has insomnia. The patient is not nervous/anxious.        Has not slept since last Tuesday. Mostly because of uncle passing.  All other systems reviewed and are negative.  14 point ROS was done and is otherwise as detailed above or in HPI   PHYSICAL EXAMINATION: ECOG PERFORMANCE STATUS: 1 - Symptomatic but completely ambulatory  Filed Vitals:   04/29/16 1300  BP: 103/61  Pulse: 80  Temp: 97.8 F (36.6 C)  Resp: 16   Filed Weights   04/29/16 1300  Weight: 119 lb (53.978 kg)    Physical Exam  Constitutional: She is oriented to person, place, and time and well-developed, well-nourished, and in no distress.  HENT:  Head: Normocephalic and atraumatic.  Nose: Nose normal.  Mouth/Throat: Oropharynx is clear and moist. No oropharyngeal exudate.  Eyes: Conjunctivae and EOM are normal. Pupils are equal, round, and reactive to light. Right eye exhibits no discharge. Left eye exhibits no discharge. No scleral icterus.  Neck: Normal range of motion. Neck supple. No tracheal deviation present. No thyromegaly present.  Cardiovascular: Normal rate, regular rhythm and normal heart sounds.  Exam reveals no gallop and no friction rub.   No murmur heard. Pulmonary/Chest: Effort normal and breath sounds normal. She has no wheezes. She has no rales.  Abdominal: Soft. Bowel sounds are normal. She exhibits no distension and no mass. There is no tenderness. There is no rebound and no guarding.  Musculoskeletal: Normal range of motion. She exhibits no edema.  Lymphadenopathy:    She has no cervical adenopathy.  Neurological: She is alert and oriented to person, place, and time. She has normal reflexes. No cranial nerve deficit.  Gait normal. Coordination normal.  Skin: Skin is warm and dry. No rash noted.  Psychiatric: Mood, memory, affect and judgment normal.  Nursing note and vitals reviewed.   LABORATORY DATA:  I have reviewed the data as listed  Results for Laurie, Howell (MRN [id number]) as of 05/11/2016 18:28  Ref. Range 04/22/2016 13:56 04/29/2016 13:15  WBC Latest Ref Range: 4.0-10.5 K/uL 5.8 5.2  RBC Latest Ref Range: 3.87-5.11 MIL/uL 4.35 4.49  Hemoglobin Latest Ref Range: 12.0-15.0 g/dL 9.8 (L) 10.9 (L)  HCT Latest Ref Range: 36.0-46.0 % 31.8 (L) 34.7 (L)  MCV Latest Ref Range: 78.0-100.0 fL 73.1 (L) 77.3 (L)  MCH Latest Ref Range: 26.0-34.0 pg 22.5 (L) 24.3 (L)  MCHC Latest Ref Range: 30.0-36.0 g/dL 30.8 31.4  RDW Latest Ref Range: 11.5-15.5 % 20.8 (H) 26.0 (H)  Platelets Latest Ref Range: 150-400 K/uL 334 326     Results for MONQUE, HAGGAR (MRN [id number]) as of 04/15/2016 13:30  Ref. Range 03/28/2016 12:50 04/11/2016 14:17 04/11/2016  14:22  Sodium Latest Ref Range: 134-144 mmol/L 140    Potassium Latest Ref Range: 3.5-5.2 mmol/L 4.0    Chloride Latest Ref Range: 96-106 mmol/L 104    CO2 Latest Ref Range: 18-29 mmol/L 21    BUN Latest Ref Range: 6-20 mg/dL 8    Creatinine Latest Ref Range: 0.57-1.00 mg/dL 0.71    Calcium Latest Ref Range: 8.7-10.2 mg/dL 9.6    EGFR (Non-African Amer.) Latest Ref Range: >59 mL/min/1.73 108    EGFR (African American) Latest Ref Range: >59 mL/min/1.73 124    Glucose Latest Ref Range: 65-99 mg/dL 97    BUN/Creatinine Ratio Latest Ref Range: 8-20  11    Alkaline Phosphatase Latest Ref Range: 39-117 IU/L 68    Albumin Latest Ref Range: 3.5-5.5 g/dL 4.3    Albumin/Globulin Ratio Latest Ref Range: 1.2-2.2  1.4    AST Latest Ref Range: 0-40 IU/L 20    ALT Latest Ref Range: 0-32 IU/L 10    Total Bilirubin Latest Ref Range: 0.0-1.2 mg/dL 0.3    Cholesterol, Total Latest Ref Range: 100-199 mg/dL 130    Triglycerides Latest Ref Range: 0-149 mg/dL 62    HDL Cholesterol  Latest Ref Range: >39 mg/dL 49    LDL (calc) Latest Ref Range: 0-99 mg/dL 69    Total CHOL/HDL Ratio Latest Ref Range: 0.0-4.4 ratio units 2.7    VLDL Cholesterol Cal Latest Ref Range: 5-40 mg/dL 12    Iron Latest Ref Range: 27-159 ug/dL 17 (L)    UIBC Latest Ref Range: 131-425 ug/dL 441 (H)    TIBC Latest Ref Range: 250-450 ug/dL 458 (H)    Ferritin Latest Ref Range: 15-150 ng/mL 3 (L)    Iron Saturation Latest Ref Range: 15-55 % 4 (LL)    Folate Latest Ref Range: >3.0 ng/mL 8.6    Vitamin D, 25-Hydroxy Latest Ref Range: 30.0-100.0 ng/mL 64.0    Vitamin B12 Latest Ref Range: 211-946 pg/mL 690    Globulin, Total Latest Ref Range: 1.5-4.5 g/dL 3.1    WBC Latest Ref Range: 3.4-10.8 x10E3/uL 5.8    RBC Latest Ref Range: 3.77-5.28 x10E6/uL 4.16    Hemoglobin Latest Ref Range: 11.1-15.9 g/dL 8.9 (<) 9.9 (L)   HCT Latest Ref Range: 34.0-46.6 % 28.9 (L)    MCV Latest Ref Range: 79-97 fL 70 (L)    MCH Latest Ref Range: 26.6-33.0 pg 21.4 (L)    MCHC Latest Ref Range: 31.5-35.7 g/dL 30.8 (L)    RDW Latest Ref Range: 12.3-15.4 % 16.9 (H)    Platelets Latest Ref Range: 150-379 x10E3/uL 362    NEUT# Latest Ref Range: 1.4-7.0 x10E3/uL 3.9    Neutrophils Latest Units: % 67    Lymphocyte # Latest Ref Range: 0.7-3.1 x10E3/uL 1.3    Monocytes Absolute Latest Ref Range: 0.1-0.9 x10E3/uL 0.4    Basophils Absolute Latest Ref Range: 0.0-0.2 x10E3/uL 0.0    Immature Granulocytes Latest Units: % 0    Immature Grans (Abs) Latest Ref Range: 0.0-0.1 x10E3/uL 0.0    Lymphs Latest Units: % 23    Monocytes Latest Units: % 7    Basos Latest Units: % 1    Retic Ct Pct Latest Ref Range: 0.6-2.6 % 0.8    Eos Latest Units: % 2    EOS (ABSOLUTE) Latest Ref Range: 0.0-0.4 x10E3/uL 0.1    Preg Test, Ur Latest Ref Range: Negative    Negative  TSH Latest Ref Range: 0.450-4.500 uIU/mL 0.696    Thyroxine (T4) Latest Ref Range:  4.5-12.0 ug/dL 7.4    Free Thyroxine Index Latest Ref Range: 1.2-4.9  1.9    T3 Uptake Ratio  Latest Ref Range: 24-39 % 26    NOTE: Unknown Comment    DIAGNOSIS: Unknown Comment    Specimen Adequacy Unknown Comment    Chlamydia, Nuc. Acid Amp Latest Ref Range: Negative  Negative    CLINICIAN PROVIDED ICD10: Unknown Comment    Gonococcus by Nucleic Acid Amp Latest Ref Range: Negative  Negative    PAP IG, CT-NG, RFX HPV ASCU Unknown Rpt    PAP REFLEX: Unknown Comment    PAP SMEAR COMMENT Unknown .    Performed by Unknown Comment    Total Protein Latest Ref Range: 6.0-8.5 g/dL 7.4    Test Methodology Unknown Comment      ASSESSMENT & PLAN:  Iron deficiency Microcytic Anemia Menorrhagia Intolerance to oral iron  I reviewed her labs and she has had an improvement in her H/H. She tolerated IV iron without difficulty.  I have urged her to follow-up with OB/GYN. I have recommended ongoing close observation of counts and iron levels until her menstrual irregularities are resolved.  She will return in one month with repeat labs and physical exam.   Orders Placed This Encounter  Procedures  . CBC with Differential    Standing Status: Future     Number of Occurrences:      Standing Expiration Date: 04/29/2017  . Ferritin    Standing Status: Future     Number of Occurrences:      Standing Expiration Date: 04/29/2017     All questions were answered. The patient knows to call the clinic with any problems, questions or concerns.  This document serves as a record of services personally performed by Ancil Linsey, MD. It was created on her behalf by Kandace Blitz, a trained medical scribe. The creation of this record is based on the scribe's personal observations and the provider's statements to them. This document has been checked and approved by the attending provider.  I have reviewed the above documentation for accuracy and completeness, and I agree with the above.  This note was electronically signed.  Molli Hazard, MD  04/29/2016 1:58 PM

## 2016-04-29 NOTE — Patient Instructions (Signed)
[place] at [facility] Discharge Instructions  RECOMMENDATIONS MADE BY THE CONSULTANT AND ANY TEST RESULTS WILL BE SENT TO YOUR REFERRING PHYSICIAN.   Exam and discussion by Dr Whitney Muse today Labs in 1 month Return to see the doctor in 1 month Please call the clinic if you have any questions or concerns     Thank you for choosing [place] at [facility] to provide your oncology and hematology care.  To afford each patient quality time with our provider, please arrive at least 15 minutes before your scheduled appointment time.   Beginning January 23rd 2017 lab work for the [facility] will be done in the  Main lab at [facility] on 1st floor. If you have a lab appointment with the [facility] please come in thru the  Main Entrance and check in at the main information desk  You need to re-schedule your appointment should you arrive 10 or more minutes late.  We strive to give you quality time with our providers, and arriving late affects you and other patients whose appointments are after yours.  Also, if you no show three or more times for appointments you may be dismissed from the clinic at the providers discretion.     Again, thank you for choosing [facility].  Our hope is that these requests will decrease the amount of time that you wait before being seen by our physicians.       _____________________________________________________________  Should you have questions after your visit to [facility], please contact our office at [phone] between the hours of 8:30 a.m. and 4:30 p.m.  Voicemails left after 4:30 p.m. will not be returned until the following business day.  For prescription refill requests, have your pharmacy contact our office.         Resources For Cancer Patients and their Caregivers ? American Cancer Society: Can assist with transportation, wigs, general needs, runs Look Good Feel  Better.        808-726-3960 ? Cancer Care: Provides financial assistance, online support groups, medication/co-pay assistance.  1-800-813-HOPE 272-386-1408) ? [place] Assists [place] cancer patients and their families through emotional , educational and financial support.  [phone] ? [place] DSS Where to apply for food stamps, Medicaid and utility assistance. 509-131-1304 ? RCATS: Transportation to medical appointments. 2562025977 ? Social Security Administration: May apply for disability if have a Stage IV cancer. 757-803-1755 [phone] ? [employer], Disability and Transit Services: Assists with nutrition, care and transit needs. [phone]

## 2016-05-02 ENCOUNTER — Encounter: Payer: Medicaid Other | Admitting: Obstetrics & Gynecology

## 2016-05-07 ENCOUNTER — Encounter: Payer: Self-pay | Admitting: Obstetrics & Gynecology

## 2016-05-07 ENCOUNTER — Ambulatory Visit (INDEPENDENT_AMBULATORY_CARE_PROVIDER_SITE_OTHER): Payer: Medicaid Other | Admitting: Obstetrics & Gynecology

## 2016-05-07 VITALS — BP 110/80 | HR 84 | Ht 64.0 in | Wt 122.0 lb

## 2016-05-07 DIAGNOSIS — N920 Excessive and frequent menstruation with regular cycle: Secondary | ICD-10-CM

## 2016-05-07 DIAGNOSIS — D500 Iron deficiency anemia secondary to blood loss (chronic): Secondary | ICD-10-CM

## 2016-05-07 DIAGNOSIS — N938 Other specified abnormal uterine and vaginal bleeding: Secondary | ICD-10-CM | POA: Diagnosis not present

## 2016-05-07 LAB — POCT HEMOGLOBIN: HEMOGLOBIN: 11 g/dL — AB (ref 12.2–16.2)

## 2016-05-07 MED ORDER — LEUPROLIDE ACETATE 3.75 MG IM KIT: 3.7500 mg | PACK | Freq: Once | INTRAMUSCULAR | Status: DC

## 2016-05-07 NOTE — Progress Notes (Signed)
Patient ID: Laurie Howell, female   DOB: 1976/02/15, 40 y.o.   MRN: [id number]      Chief Complaint  Patient presents with  . Referral    having period for 2 month. stop taking Megace two day ago, not helping    Blood pressure 110/80, pulse 84, height '5\' 4"'  (1.626 m), weight 122 lb (55.339 kg).  40 y.o. [id number] No LMP recorded. The current method of family planning is Megace.  Subjective Patient is been having heavy vaginal bleeding despite 2 and 3 megestrol tablets daily She is not slowed up at all Hemoglobin is 11.0  Objective   Pertinent ROS   Labs or studies     Impression Diagnoses this Encounter::   ICD-9-CM ICD-10-CM   1. DUB (dysfunctional uterine bleeding) 626.8 N93.8   2. Menorrhagia with regular cycle 626.2 N92.0 POCT hemoglobin    Established relevant diagnosis(es):   Plan/Recommendations: Meds ordered this encounter  Medications  . leuprolide (LUPRON) 3.75 MG injection    Sig: Inject 3.75 mg into the muscle once.    Dispense:  1 kit    Refill:  1    Labs or Scans Ordered: Orders Placed This Encounter  Procedures  . POCT hemoglobin    Management:: We'll try to get her an injection of Lupron and take iron she would like to have definitive therapy future but the present time is not the right time  Follow up Return in about 1 month (around 06/07/2016) for Follow up, with Dr Elonda Husky.        Face to face time:  15 minutes  Greater than 50% of the visit time was spent in counseling and coordination of care with the patient.  The summary and outline of the counseling and care coordination is summarized in the note above.   All questions were answered.

## 2016-05-11 ENCOUNTER — Encounter (HOSPITAL_COMMUNITY): Payer: Self-pay | Admitting: Hematology & Oncology

## 2016-05-21 ENCOUNTER — Encounter: Payer: Self-pay | Admitting: Family

## 2016-05-21 ENCOUNTER — Ambulatory Visit (INDEPENDENT_AMBULATORY_CARE_PROVIDER_SITE_OTHER): Payer: Medicaid Other | Admitting: Family

## 2016-05-21 VITALS — BP 117/79 | HR 105 | Temp 97.3°F | Ht 64.0 in | Wt 122.6 lb

## 2016-05-21 DIAGNOSIS — R350 Frequency of micturition: Secondary | ICD-10-CM | POA: Diagnosis not present

## 2016-05-21 DIAGNOSIS — N3001 Acute cystitis with hematuria: Secondary | ICD-10-CM

## 2016-05-21 LAB — MICROSCOPIC EXAMINATION: WBC, UA: >30 /hpf — AB (ref 0–?)

## 2016-05-21 LAB — URINALYSIS, COMPLETE
NITRITE UA: POSITIVE — AB
SPEC GRAV UA: 1.025 (ref 1.005–1.030)
UUROB: 4 mg/dL — AB (ref 0.2–1.0)
pH, UA: 5 (ref 5.0–7.5)

## 2016-05-21 MED ORDER — CEFTRIAXONE SODIUM 1 G IJ SOLR
1.0000 g | Freq: Once | INTRAMUSCULAR | Status: AC
  Administered 2016-05-21: 1 g via INTRAMUSCULAR

## 2016-05-21 MED ORDER — SULFAMETHOXAZOLE-TRIMETHOPRIM 800-160 MG PO TABS: 1.0000 | ORAL_TABLET | Freq: Two times a day (BID) | ORAL | Status: DC

## 2016-05-21 NOTE — Patient Instructions (Signed)

## 2016-05-21 NOTE — Progress Notes (Signed)
   Subjective:    Patient ID: Laurie Howell, female    DOB: [birth year], 40 y.o.   MRN: [id number]  Dysuria  This is a new problem. The current episode started in the past 7 days. The problem occurs every urination. The problem has been gradually worsening. The quality of the pain is described as burning. The pain is at a severity of 6/10. There has been no fever. Associated symptoms include flank pain, frequency, hesitancy and urgency. Pertinent negatives include no chills, discharge, hematuria, nausea or vomiting. She has tried acetaminophen and increased fluids for the symptoms. The treatment provided mild relief.      Review of Systems  Constitutional: Negative for chills.  Gastrointestinal: Negative for nausea and vomiting.  Genitourinary: Positive for dysuria, hesitancy, urgency, frequency and flank pain. Negative for hematuria.  All other systems reviewed and are negative.      Objective:   Physical Exam  Constitutional: She is oriented to person, place, and time. She appears well-developed and well-nourished. No distress.  HENT:  Head: Normocephalic and atraumatic.  Eyes: Pupils are equal, round, and reactive to light.  Neck: Normal range of motion. Neck supple. No thyromegaly present.  Cardiovascular: Normal rate, regular rhythm, normal heart sounds and intact distal pulses.   No murmur heard. Pulmonary/Chest: Effort normal and breath sounds normal. No respiratory distress. She has no wheezes.  Abdominal: Soft. Bowel sounds are normal. She exhibits no distension. There is tenderness (mild lower abd pain).  Musculoskeletal: Normal range of motion. She exhibits no edema or tenderness.  Mild left CVA tenderness   Neurological: She is alert and oriented to person, place, and time. She has normal reflexes. No cranial nerve deficit.  Skin: Skin is warm and dry.  Psychiatric: She has a normal mood and affect. Her behavior is normal. Judgment and thought content normal.  Vitals  reviewed.   BP 117/79 mmHg  Pulse 105  Temp(Src) 97.3 F (36.3 C) (Oral)  Ht 5\' 4"  (1.626 m)  Wt 122 lb 9.6 oz (55.611 kg)  BMI 21.03 kg/m2      Assessment & Plan:  1. Urine frequency - Urinalysis, Complete  2. Acute cystitis with hematuria -Force fluids AZO over the counter X2 days RTO prn Culture pending - cefTRIAXone (ROCEPHIN) injection 1 g; Inject 1 g into the muscle once. - sulfamethoxazole-trimethoprim (BACTRIM DS) 800-160 MG tablet; Take 1 tablet by mouth 2 (two) times daily.  Dispense: 14 tablet; Refill: 0  Evelina Dun, FNP

## 2016-05-23 LAB — URINE CULTURE

## 2016-05-30 ENCOUNTER — Encounter (HOSPITAL_COMMUNITY): Payer: Self-pay | Admitting: Hematology & Oncology

## 2016-05-30 ENCOUNTER — Encounter (HOSPITAL_COMMUNITY): Payer: Medicaid Other | Attending: Hematology & Oncology | Admitting: Hematology & Oncology

## 2016-05-30 ENCOUNTER — Encounter (HOSPITAL_COMMUNITY): Payer: Medicaid Other

## 2016-05-30 VITALS — BP 121/90 | HR 72 | Temp 97.9°F | Resp 20 | Wt 120.6 lb

## 2016-05-30 DIAGNOSIS — Z79899 Other long term (current) drug therapy: Secondary | ICD-10-CM | POA: Insufficient documentation

## 2016-05-30 DIAGNOSIS — N920 Excessive and frequent menstruation with regular cycle: Secondary | ICD-10-CM | POA: Diagnosis not present

## 2016-05-30 DIAGNOSIS — Z9889 Other specified postprocedural states: Secondary | ICD-10-CM | POA: Insufficient documentation

## 2016-05-30 DIAGNOSIS — D509 Iron deficiency anemia, unspecified: Secondary | ICD-10-CM | POA: Diagnosis present

## 2016-05-30 DIAGNOSIS — Z87891 Personal history of nicotine dependence: Secondary | ICD-10-CM | POA: Diagnosis not present

## 2016-05-30 LAB — CBC WITH DIFFERENTIAL/PLATELET
BASOS ABS: 0 10*3/uL (ref 0.0–0.1)
Basophils Relative: 1 %
Eosinophils Absolute: 0.1 10*3/uL (ref 0.0–0.7)
Eosinophils Relative: 2 %
HEMATOCRIT: 38.9 % (ref 36.0–46.0)
Hemoglobin: 12.7 g/dL (ref 12.0–15.0)
LYMPHS PCT: 35 %
Lymphs Abs: 1.8 10*3/uL (ref 0.7–4.0)
MCH: 27.6 pg (ref 26.0–34.0)
MCHC: 32.6 g/dL (ref 30.0–36.0)
MCV: 84.6 fL (ref 78.0–100.0)
Monocytes Absolute: 0.4 10*3/uL (ref 0.1–1.0)
Monocytes Relative: 8 %
NEUTROS ABS: 2.8 10*3/uL (ref 1.7–7.7)
NEUTROS PCT: 55 %
Platelets: 245 10*3/uL (ref 150–400)
RBC: 4.6 MIL/uL (ref 3.87–5.11)
RDW: 23.9 % — ABNORMAL HIGH (ref 11.5–15.5)
WBC: 5.1 10*3/uL (ref 4.0–10.5)

## 2016-05-30 LAB — FERRITIN: Ferritin: 16 ng/mL (ref 11–307)

## 2016-05-30 NOTE — Patient Instructions (Addendum)
[place] at [facility] Discharge Instructions  RECOMMENDATIONS MADE BY THE CONSULTANT AND ANY TEST RESULTS WILL BE SENT TO YOUR REFERRING PHYSICIAN.  Refer to GI for dysphagia  RTC in 6 weeks for MD appt and labs   Thank you for choosing [place] at [facility] to provide your oncology and hematology care.  To afford each patient quality time with our provider, please arrive at least 15 minutes before your scheduled appointment time.   Beginning January 23rd 2017 lab work for the [facility] will be done in the  Main lab at [facility] on 1st floor. If you have a lab appointment with the [facility] please come in thru the  Main Entrance and check in at the main information desk  You need to re-schedule your appointment should you arrive 10 or more minutes late.  We strive to give you quality time with our providers, and arriving late affects you and other patients whose appointments are after yours.  Also, if you no show three or more times for appointments you may be dismissed from the clinic at the providers discretion.     Again, thank you for choosing [facility].  Our hope is that these requests will decrease the amount of time that you wait before being seen by our physicians.       _____________________________________________________________  Should you have questions after your visit to [facility], please contact our office at [phone] between the hours of 8:30 a.m. and 4:30 p.m.  Voicemails left after 4:30 p.m. will not be returned until the following business day.  For prescription refill requests, have your pharmacy contact our office.         Resources For Cancer Patients and their Caregivers ? American Cancer Society: Can assist with transportation, wigs, general needs, runs Look Good Feel Better.        ([phone] ? Cancer Care: Provides financial assistance, online support  groups, medication/co-pay assistance.  1-800-813-HOPE 7056380784) ? [place] Assists [place] cancer patients and their families through emotional , educational and financial support.  819-844-3958 ? [place] DSS Where to apply for food stamps, Medicaid and utility assistance. 717-858-8319 ? RCATS: Transportation to medical appointments. (787) 363-6288 ? Social Security Administration: May apply for disability if have a Stage IV cancer. 9891741598 [phone] ? [employer], Disability and Transit Services: Assists with nutrition, care and transit needs. [place] Support Programs: @10RELATIVEDAYS @ > Cancer Support Group  2nd Tuesday of the month 1pm-2pm, Journey Room  > Creative Journey  3rd Tuesday of the month 1130am-1pm, Journey Room  > Look Good Feel Better  1st Wednesday of the month 10am-12 noon, Journey Room (Call [place] to register [phone])

## 2016-05-30 NOTE — Progress Notes (Signed)
[place]  Progress Note  Patient Care Team: Laurie Balloon, FNP as PCP - General (Family Medicine)  CHIEF COMPLAINTS/PURPOSE OF CONSULTATION:  Iron deficiency Anemia  HISTORY OF PRESENTING ILLNESS:  Laurie Howell 40 y.o. female is here because of Anemia and Iron deficiency. She sees Dr. Wendi Howell for primary care and is noted to have menorrhagia, chronic anovulatory bleeding, iron deficiency anemia. She has been referred to [place] for further management. She has received one dose of injectafer with excellent tolerance. Counts today are WNL.   Laurie Howell returns to the [facility] today accompanied by her son.  She could not afford the Lupron shot because medicaid wouldn't cover it. She is going back to see Dr. Elonda Howell on Tuesday at 2.  She notes she's had trouble swallowing for about 4-5 years, describing things that have gotten stuck in her throat. She specifically mentions a hot dog at her son's birthday party 4-5 years ago, and a pill about a year after that. She repeats that due to her trouble swallowing, she has to watch what she eats.  Currently her energy is improved. She denies pica or pagophagia.  MEDICAL HISTORY:  Past Medical History  Diagnosis Date  . Esophageal stricture   . Headache   . Anemia   . Frequent UTI     SURGICAL HISTORY: Past Surgical History  Procedure Laterality Date  . Tubal ligation  1997  . Tubal reversal  02/2015    NCCM - Dr Laurie Howell    SOCIAL HISTORY: Social History   Social History  . Marital Status: Legally Separated    Spouse Name: Laurie Howell  . Number of Children: 2  . Years of Education: 8th   Occupational History  . Laurie Howell    Social History Main Topics  . Smoking status: Former Smoker    Types: Cigarettes    Quit date: 12/30/1994  . Smokeless tobacco: Never Used  . Alcohol Use: No  . Drug Use: No  . Sexual Activity: Yes    Birth Control/ Protection: None   Other Topics Concern  . Not on file   Social History  Narrative  Divorced 2 children Doesn't work; is on disability Doesn't smoke Rare ETOH use No hobbies Mother living at 39. Had open heart surgery 2 years ago Father passed at 73 from massive heart attack 2 sisters (Both have anemia problems)  FAMILY HISTORY: Family History  Problem Relation Age of Onset  . Depression Mother   . Hypertension Mother   . Heart disease Father   . Hypertension Father   . Anxiety disorder Father   . Heart attack Father     ALLERGIES:  is allergic to aspirin and ivp dye.  MEDICATIONS:  Current Outpatient Prescriptions  Medication Sig Dispense Refill  . Ibuprofen-Diphenhydramine Cit (ADVIL PM PO) Take by mouth at bedtime as needed.    . Iron Polysacch Cmplx-B12-FA 150-0.025-1 MG CAPS Take 1 mg by mouth once. 30 each 3   No current facility-administered medications for this visit.    Review of Systems  Constitutional: Positive for malaise/fatigue. Negative for fever, chills and weight loss.  HENT: Negative for congestion, hearing loss, nosebleeds, sore throat and tinnitus.   Eyes: Negative.  Negative for blurred vision, double vision, pain and discharge.  Respiratory: Negative.  Negative for cough, hemoptysis, sputum production, shortness of breath and wheezing.   Cardiovascular: Negative.  Negative for chest pain, palpitations, claudication, leg swelling and PND.  Gastrointestinal: Negative.  Negative for heartburn,  nausea, vomiting, abdominal pain, diarrhea, constipation, blood in stool and melena.       Bloating.  Genitourinary: Negative.  Negative for dysuria, urgency, frequency and hematuria.       Heavy periods  Musculoskeletal: Negative.  Negative for myalgias, joint pain and falls.       Has leg cramps occasionally.  Skin: Negative.  Negative for itching and rash.  Neurological: Positive for headaches. Negative for dizziness, tingling, tremors, sensory change, speech change, focal weakness, seizures, loss of consciousness and weakness.    Endo/Heme/Allergies: Negative.  Does not bruise/bleed easily.  Psychiatric/Behavioral: Negative for depression, suicidal ideas, memory loss and substance abuse. The patient is not nervous/anxious and does not have insomnia.   All other systems reviewed and are negative.  14 point ROS was done and is otherwise as detailed above or in HPI   PHYSICAL EXAMINATION: ECOG PERFORMANCE STATUS: 0 - Asymptomatic  Filed Vitals:   05/30/16 1353  BP: 121/90  Pulse: 72  Temp: 97.9 F (36.6 C)  Resp: 20   Filed Weights   05/30/16 1353  Weight: 120 lb 9.6 oz (54.704 kg)    Physical Exam  Constitutional: She is oriented to person, place, and time and well-developed, well-nourished, and in no distress.  HENT:  Head: Normocephalic and atraumatic.  Nose: Nose normal.  Mouth/Throat: Oropharynx is clear and moist. No oropharyngeal exudate.  Eyes: Conjunctivae and EOM are normal. Pupils are equal, round, and reactive to light. Right eye exhibits no discharge. Left eye exhibits no discharge. No scleral icterus.  Neck: Normal range of motion. Neck supple. No tracheal deviation present. No thyromegaly present.  Cardiovascular: Normal rate, regular rhythm and normal heart sounds.  Exam reveals no gallop and no friction rub.   No murmur heard. Pulmonary/Chest: Effort normal and breath sounds normal. She has no wheezes. She has no rales.  Abdominal: Soft. Bowel sounds are normal. She exhibits no distension and no mass. There is no tenderness. There is no rebound and no guarding.  Musculoskeletal: Normal range of motion. She exhibits no edema.  Lymphadenopathy:    She has no cervical adenopathy.  Neurological: She is alert and oriented to person, place, and time. She has normal reflexes. No cranial nerve deficit. Gait normal. Coordination normal.  Skin: Skin is warm and dry. No rash noted.  Psychiatric: Mood, memory, affect and judgment normal.  Nursing note and vitals reviewed.   LABORATORY DATA:  I  have reviewed the data as listed  Results for Laurie, EINSPAHR (MRN [id number]) as of 05/11/2016 18:28  Ref. Range 04/22/2016 13:56 04/29/2016 13:15  WBC Latest Ref Range: 4.0-10.5 K/uL 5.8 5.2  RBC Latest Ref Range: 3.87-5.11 MIL/uL 4.35 4.49  Hemoglobin Latest Ref Range: 12.0-15.0 g/dL 9.8 (L) 10.9 (L)  HCT Latest Ref Range: 36.0-46.0 % 31.8 (L) 34.7 (L)  MCV Latest Ref Range: 78.0-100.0 fL 73.1 (L) 77.3 (L)  MCH Latest Ref Range: 26.0-34.0 pg 22.5 (L) 24.3 (L)  MCHC Latest Ref Range: 30.0-36.0 g/dL 30.8 31.4  RDW Latest Ref Range: 11.5-15.5 % 20.8 (H) 26.0 (H)  Platelets Latest Ref Range: 150-400 K/uL 334 326     Results for FELISIA, UHLMANN (MRN [id number]) as of 05/30/2016 14:00  Ref. Range 05/30/2016 13:25  WBC Latest Ref Range: 4.0-10.5 K/uL 5.1  RBC Latest Ref Range: 3.87-5.11 MIL/uL 4.60  Hemoglobin Latest Ref Range: 12.0-15.0 g/dL 12.7  HCT Latest Ref Range: 36.0-46.0 % 38.9  MCV Latest Ref Range: 78.0-100.0 fL 84.6  MCH Latest Ref Range: 26.0-34.0  pg 27.6  MCHC Latest Ref Range: 30.0-36.0 g/dL 32.6  RDW Latest Ref Range: 11.5-15.5 % 23.9 (H)  Platelets Latest Ref Range: 150-400 K/uL 245  Neutrophils Latest Units: % 55  Lymphocytes Latest Units: % 35  Monocytes Relative Latest Units: % 8  Eosinophil Latest Units: % 2  Basophil Latest Units: % 1  NEUT# Latest Ref Range: 1.7-7.7 K/uL 2.8  Lymphocyte # Latest Ref Range: 0.7-4.0 K/uL 1.8  Monocyte # Latest Ref Range: 0.1-1.0 K/uL 0.4  Eosinophils Absolute Latest Ref Range: 0.0-0.7 K/uL 0.1  Basophils Absolute Latest Ref Range: 0.0-0.1 K/uL 0.0     ASSESSMENT & PLAN:  Iron deficiency Microcytic Anemia Menorrhagia Intolerance to oral iron Intermittent dysphagia  I reviewed her labs and she has had an improvement in her H/H. Hgb today is 12.7. She tolerated IV iron without difficulty.  I have urged her to follow-up with OB/GYN. I have recommended ongoing close observation of counts and iron levels until her menstrual  irregularities are resolved.  She will return in one month with repeat labs and physical exam.   She notes longstanding intermittent dysphagia, will refer to GI. No orders of the defined types were placed in this encounter.    All questions were answered. The patient knows to call the clinic with any problems, questions or concerns.  This document serves as a record of services personally performed by Ancil Linsey, MD. It was created on her behalf by Toni Amend, a trained medical scribe. The creation of this record is based on the scribe's personal observations and the provider's statements to them. This document has been checked and approved by the attending provider.  I have reviewed the above documentation for accuracy and completeness, and I agree with the above.  This note was electronically signed.  Molli Hazard, MD  05/30/2016 2:04 PM

## 2016-05-31 ENCOUNTER — Encounter (HOSPITAL_COMMUNITY): Payer: Self-pay | Admitting: Hematology & Oncology

## 2016-06-02 ENCOUNTER — Other Ambulatory Visit (HOSPITAL_COMMUNITY): Payer: Self-pay | Admitting: Hematology & Oncology

## 2016-06-03 ENCOUNTER — Encounter: Payer: Self-pay | Admitting: Gastroenterology

## 2016-06-04 ENCOUNTER — Ambulatory Visit (INDEPENDENT_AMBULATORY_CARE_PROVIDER_SITE_OTHER): Payer: Medicaid Other | Admitting: Obstetrics & Gynecology

## 2016-06-04 ENCOUNTER — Encounter: Payer: Self-pay | Admitting: Obstetrics & Gynecology

## 2016-06-04 VITALS — BP 90/70 | HR 80 | Wt 122.0 lb

## 2016-06-04 DIAGNOSIS — D500 Iron deficiency anemia secondary to blood loss (chronic): Secondary | ICD-10-CM

## 2016-06-04 DIAGNOSIS — N938 Other specified abnormal uterine and vaginal bleeding: Secondary | ICD-10-CM | POA: Diagnosis not present

## 2016-06-04 MED ORDER — MEGESTROL ACETATE 40 MG PO TABS: ORAL_TABLET | ORAL | Status: DC

## 2016-06-04 NOTE — Progress Notes (Signed)
Patient ID: Laurie Howell, female   DOB: [birth year], 40 y.o.   MRN: [id number]      Chief Complaint  Patient presents with  . Follow-up    still bleeding- heavy today. iron tranfusion on Thursday    Blood pressure 90/70, pulse 80, weight 122 lb (55.339 kg), last menstrual period 05/31/2016.  40 y.o. [id number] Patient's last menstrual period was 05/31/2016. The current method of family planning is none.  Subjective Pt has been having on going heavy vaginal bleeding Note undergone tubal reanastomosis s/p ligation without success Anemic with significant iron deficiency, s/p iron infusion 6/1 Continues to bleed heavily her last pap was 3-4 months ago and was normal  Objective Vulva:  normal appearing vulva with no masses, tenderness or lesions Vagina:  Blood in vault no lesions Cervix:  no cervical motion tenderness and no lesions no descent ? Adhesions s/p reanastomosis Uterus:  normal size, contour, position, consistency, mobility, non-tender and anteverted Adnexa: ovaries:present,  normal adnexa in size, nontender and no masses    Pertinent ROS No burning with urination, frequency or urgency No nausea, vomiting or diarrhea Nor fever chills or other constitutional symptoms   Labs or studies Reviewed CBC Latest Ref Rng 05/30/2016 05/07/2016 04/29/2016  WBC 4.0 - 10.5 K/uL 5.1 - 5.2  Hemoglobin 12.0 - 15.0 g/dL 12.7 11.0(A) 10.9(L)  Hematocrit 36.0 - 46.0 % 38.9 - 34.7(L)  Platelets 150 - 400 K/uL 245 - 326       Impression Diagnoses this Encounter::   ICD-9-CM ICD-10-CM   1. DUB (dysfunctional uterine bleeding) 626.8 N93.8   2. Anemia due to chronic blood loss 280.0 D50.0     Established relevant diagnosis(es):   Plan/Recommendations: Meds ordered this encounter  Medications  . megestrol (MEGACE) 40 MG tablet    Sig: 3 tablets a day for 5 days, 2 tablets a day for 5 days then 1 tablet daily    Dispense:  45 tablet    Refill:  3    Labs or Scans Ordered: No  orders of the defined types were placed in this encounter.    Management:: Discussed at length continued megestrol with probable need for definitive surgical management, no descent which would mean mini lap hysterectomy, consider supracervical  Follow up Return in about 1 month (around 07/04/2016) for Follow up, with Dr Elonda Husky.      All questions were answered.

## 2016-06-06 ENCOUNTER — Encounter (HOSPITAL_COMMUNITY): Payer: Self-pay

## 2016-06-06 ENCOUNTER — Encounter (HOSPITAL_BASED_OUTPATIENT_CLINIC_OR_DEPARTMENT_OTHER): Payer: Medicaid Other

## 2016-06-06 ENCOUNTER — Ambulatory Visit (HOSPITAL_COMMUNITY): Payer: Medicaid Other

## 2016-06-06 VITALS — BP 106/67 | HR 74 | Temp 98.5°F | Resp 18

## 2016-06-06 DIAGNOSIS — D509 Iron deficiency anemia, unspecified: Secondary | ICD-10-CM

## 2016-06-06 DIAGNOSIS — N921 Excessive and frequent menstruation with irregular cycle: Secondary | ICD-10-CM | POA: Diagnosis not present

## 2016-06-06 MED ORDER — SODIUM CHLORIDE 0.9 % IV SOLN
INTRAVENOUS | Status: DC
  Administered 2016-06-06: 14:00:00 via INTRAVENOUS

## 2016-06-06 MED ORDER — SODIUM CHLORIDE 0.9 % IV SOLN
510.0000 mg | Freq: Once | INTRAVENOUS | Status: AC
  Administered 2016-06-06: 510 mg via INTRAVENOUS
  Filled 2016-06-06: qty 17

## 2016-06-06 NOTE — Progress Notes (Signed)
1430:  Tolerated infusion w/o adverse reaction.  A&Ox4, in no distress.  VSS.  Discharged ambulatory.

## 2016-06-06 NOTE — Patient Instructions (Signed)
[place] at [facility] Discharge Instructions  RECOMMENDATIONS MADE BY THE CONSULTANT AND ANY TEST RESULTS WILL BE SENT TO YOUR REFERRING PHYSICIAN.  Iron infusion today. Return as scheduled for lab work and office visit.\ Call the clinic with any questions or concerns.   Thank you for choosing [place] at [facility] to provide your oncology and hematology care.  To afford each patient quality time with our provider, please arrive at least 15 minutes before your scheduled appointment time.   Beginning January 23rd 2017 lab work for the [facility] will be done in the  Main lab at [facility] on 1st floor. If you have a lab appointment with the [facility] please come in thru the  Main Entrance and check in at the main information desk  You need to re-schedule your appointment should you arrive 10 or more minutes late.  We strive to give you quality time with our providers, and arriving late affects you and other patients whose appointments are after yours.  Also, if you no show three or more times for appointments you may be dismissed from the clinic at the providers discretion.     Again, thank you for choosing [facility].  Our hope is that these requests will decrease the amount of time that you wait before being seen by our physicians.       _____________________________________________________________  Should you have questions after your visit to [facility], please contact our office at [phone] between the hours of 8:30 a.m. and 4:30 p.m.  Voicemails left after 4:30 p.m. will not be returned until the following business day.  For prescription refill requests, have your pharmacy contact our office.         Resources For Cancer Patients and their Caregivers ? American Cancer Society: Can assist with transportation, wigs, general needs, runs Look Good Feel Better.        4427203217 ? Cancer  Care: Provides financial assistance, online support groups, medication/co-pay assistance.  1-800-813-HOPE 586-768-7079) ? [facility] Assists [place] cancer patients and their families through emotional , educational and financial support.  (808)735-4825 ? [place] DSS Where to apply for food stamps, Medicaid and utility assistance. 702-660-7957 ? [facility]: Transportation to medical appointments. 9282689333 ? Social Security Administration: May apply for disability if have a Stage IV cancer. 912-690-8046 631-366-0768 ? [employer], Disability and Transit Services: Assists with nutrition, care and transit needs. [place] Support Programs: @10RELATIVEDAYS @ > Cancer Support Group  2nd Tuesday of the month 1pm-2pm, Journey Room  > Creative Journey  3rd Tuesday of the month 1130am-1pm, Journey Room  > Look Good Feel Better  1st Wednesday of the month 10am-12 noon, Journey Room (Call [facility] to register [phone])

## 2016-06-20 ENCOUNTER — Encounter: Payer: Self-pay | Admitting: Nurse Practitioner

## 2016-06-20 ENCOUNTER — Ambulatory Visit (INDEPENDENT_AMBULATORY_CARE_PROVIDER_SITE_OTHER): Payer: Medicaid Other | Admitting: Nurse Practitioner

## 2016-06-20 ENCOUNTER — Other Ambulatory Visit: Payer: Self-pay

## 2016-06-20 VITALS — BP 104/73 | HR 82 | Temp 97.6°F | Ht 62.0 in | Wt 121.4 lb

## 2016-06-20 DIAGNOSIS — K2100 Gastro-esophageal reflux disease with esophagitis, without bleeding: Secondary | ICD-10-CM

## 2016-06-20 DIAGNOSIS — K219 Gastro-esophageal reflux disease without esophagitis: Secondary | ICD-10-CM

## 2016-06-20 DIAGNOSIS — R1310 Dysphagia, unspecified: Secondary | ICD-10-CM | POA: Diagnosis not present

## 2016-06-20 MED ORDER — PANTOPRAZOLE SODIUM 40 MG PO TBEC: 40.0000 mg | DELAYED_RELEASE_TABLET | Freq: Every day | ORAL | Status: DC

## 2016-06-20 NOTE — Patient Instructions (Signed)
1. We will schedule your procedure for you. 2. Stop taking over-the-counter lansoprazole. 3. I'll send in a prescription for Protonix 40 mg once a day to your pharmacy. 4. Return for follow-up in 3 months.

## 2016-06-20 NOTE — Progress Notes (Signed)
Primary Care Physician:  Evelina Dun, FNP Primary Gastroenterologist:  Dr. Oneida Alar  Chief Complaint  Patient presents with  . Dysphagia    HPI:   Laurie Howell is a 40 y.o. female who presents On referral from hematology/oncology for difficulty swallowing. She sees hematology for iron deficiency anemia due to menorrhagia and is on iron management therapy with them. She last saw them on 05/30/2016 at which point it was noted she had trouble swallowing for 45 years, things getting stuck in her throat requiring her to watch which she eats. No record of previous colonoscopy or endoscopy in our system.  Today she states she's had dysphagia problems about 5 years ago, first noticed when she got "choked on a hot dog." A week afterward she had a large pill get stuck, called 911, went to [facility] with EGD and extraction done. Last episode a couple days ago, Generally watches what she eats to prevent problems. Pill dysphagia, foods worse include meats and breads. Has regurgitation. Rare abdominal pain, N/V, hematochezia, melena. Has a history of GERD and takes OTC Lansoprazole but still has flare symptoms. Denies chest pain, dyspnea, dizziness, lightheadedness, syncope, near syncope. Denies any other upper or lower GI symptoms.  Past Medical History  Diagnosis Date  . Esophageal stricture   . Headache   . Anemia   . Frequent UTI   . GERD (gastroesophageal reflux disease)     Past Surgical History  Procedure Laterality Date  . Tubal ligation  1997  . Tubal reversal  02/2015    NCCM - Dr Karie Kirks    Current Outpatient Prescriptions  Medication Sig Dispense Refill  . Iron Polysacch Cmplx-B12-FA 150-0.025-1 MG CAPS Take 1 mg by mouth once. 30 each 3  . lansoprazole (PREVACID) 15 MG capsule Take 15 mg by mouth daily at 12 noon.    . megestrol (MEGACE) 40 MG tablet 3 tablets a day for 5 days, 2 tablets a day for 5 days then 1 tablet daily 45 tablet 3  . pantoprazole (PROTONIX) 40 MG  tablet Take 1 tablet (40 mg total) by mouth daily. 30 tablet 3   No current facility-administered medications for this visit.    Allergies as of 06/20/2016 - Review Complete 06/20/2016  Allergen Reaction Noted  . Aspirin Swelling 07/09/2013  . Ivp dye [iodinated diagnostic agents]  07/09/2013    Family History  Problem Relation Age of Onset  . Depression Mother   . Hypertension Mother   . Heart disease Father   . Hypertension Father   . Anxiety disorder Father   . Heart attack Father   . Colon cancer Neg Hx     Social History   Social History  . Marital Status: Legally Separated    Spouse Name: N/A  . Number of Children: 2  . Years of Education: 8th   Occupational History  . N/A    Social History Main Topics  . Smoking status: Former Smoker    Types: Cigarettes    Quit date: 12/30/1994  . Smokeless tobacco: Never Used  . Alcohol Use: No  . Drug Use: No  . Sexual Activity: Yes    Birth Control/ Protection: None   Other Topics Concern  . Not on file   Social History Narrative    Review of Systems: 10-point ROS negative except as per HPI.    Physical Exam: BP 104/73 mmHg  Pulse 82  Temp(Src) 97.6 F (36.4 C)  Ht 5\' 2"  (1.575 m)  Wt  121 lb 6.4 oz (55.067 kg)  BMI 22.20 kg/m2  LMP 05/31/2016 General:   Alert and oriented. Pleasant and cooperative. Well-nourished and well-developed.  Head:  Normocephalic and atraumatic. Eyes:  Without icterus, sclera clear and conjunctiva pink.  Ears:  Normal auditory acuity. Cardiovascular:  S1, S2 present without murmurs appreciated. Extremities without clubbing or edema. Respiratory:  Clear to auscultation bilaterally. No wheezes, rales, or rhonchi. No distress.  Gastrointestinal:  +BS, soft, non-tender and non-distended. No HSM noted. No guarding or rebound. No masses appreciated.  Rectal:  Deferred  Musculoskalatal:  Symmetrical without gross deformities. Neurologic:  Alert and oriented x4;  grossly normal  neurologically. Psych:  Alert and cooperative. Normal mood and affect. Heme/Lymph/Immune: No excessive bruising noted.    06/20/2016 8:33 AM   Disclaimer: This note was dictated with voice recognition software. Similar sounding words can inadvertently be transcribed and may not be corrected upon review.

## 2016-06-20 NOTE — Assessment & Plan Note (Signed)
Patient describes dysphagia which began about 5 years ago requiring endoscopy for pill retrieval. No dilation performed per the patient. At this point she continues with dysphagia symptoms and occasional regurgitation. Has a history of GERD as noted below. At this point we'll proceed with an upper endoscopy and possible dilation to further evaluate. Return for follow-up in 3 months.

## 2016-06-20 NOTE — Assessment & Plan Note (Signed)
History of GERD, currently on over-the-counter lansoprazole. States her symptoms are not generally well controlled with breakthrough symptoms noted. This could be a potential etiology for her dysphagia. I'll have her stop the over-the-counter lansoprazole and start her on Protonix 40 mg once a day. Return for follow-up in 3 months.

## 2016-06-20 NOTE — Progress Notes (Signed)
cc'ed to pcp °

## 2016-06-24 ENCOUNTER — Telehealth: Payer: Self-pay | Admitting: General Practice

## 2016-06-24 NOTE — Telephone Encounter (Signed)
Patient is having a EGD done on Thursday, however she had a tooth pulled on Friday and is currently taking Amoxicilin and wanted to make sure that would not intefere with her procedure.  I told it should not, however I would route it to Dr. Oneida Alar to make sure she didn't have any further recommendations   Routing to [facility]

## 2016-06-25 NOTE — Telephone Encounter (Signed)
PLEASE CALL PT. OK TO HAVE EGD/DIL ON THUR.

## 2016-06-25 NOTE — Telephone Encounter (Signed)
lmom 

## 2016-06-27 ENCOUNTER — Encounter (HOSPITAL_COMMUNITY)
Admission: RE | Disposition: A | Discharge status: Home / Self Care | Payer: Self-pay | Source: Ambulatory Visit | Attending: Gastroenterology

## 2016-06-27 ENCOUNTER — Encounter (HOSPITAL_COMMUNITY): Payer: Self-pay

## 2016-06-27 ENCOUNTER — Ambulatory Visit (HOSPITAL_COMMUNITY)
Admission: RE | Admit: 2016-06-27 | Discharge: 2016-06-27 | Disposition: A | Discharge status: Home / Self Care | Payer: Medicaid Other | Source: Ambulatory Visit | Attending: Gastroenterology | Admitting: Gastroenterology

## 2016-06-27 DIAGNOSIS — K200 Eosinophilic esophagitis: Secondary | ICD-10-CM | POA: Diagnosis not present

## 2016-06-27 DIAGNOSIS — K219 Gastro-esophageal reflux disease without esophagitis: Secondary | ICD-10-CM

## 2016-06-27 DIAGNOSIS — Z87891 Personal history of nicotine dependence: Secondary | ICD-10-CM | POA: Diagnosis not present

## 2016-06-27 DIAGNOSIS — R1013 Epigastric pain: Secondary | ICD-10-CM | POA: Insufficient documentation

## 2016-06-27 DIAGNOSIS — K2100 Gastro-esophageal reflux disease with esophagitis, without bleeding: Secondary | ICD-10-CM | POA: Insufficient documentation

## 2016-06-27 DIAGNOSIS — Z79899 Other long term (current) drug therapy: Secondary | ICD-10-CM | POA: Diagnosis not present

## 2016-06-27 DIAGNOSIS — R1310 Dysphagia, unspecified: Secondary | ICD-10-CM | POA: Diagnosis not present

## 2016-06-27 HISTORY — PX: ESOPHAGOGASTRODUODENOSCOPY: SHX5428

## 2016-06-27 HISTORY — PX: SAVORY DILATION: SHX5439

## 2016-06-27 SURGERY — EGD (ESOPHAGOGASTRODUODENOSCOPY)
Anesthesia: Moderate Sedation

## 2016-06-27 MED ORDER — MIDAZOLAM HCL 5 MG/5ML IJ SOLN
INTRAMUSCULAR | Status: AC
  Filled 2016-06-27: qty 10

## 2016-06-27 MED ORDER — PANTOPRAZOLE SODIUM 40 MG PO TBEC: DELAYED_RELEASE_TABLET | ORAL | Status: DC

## 2016-06-27 MED ORDER — MEPERIDINE HCL 100 MG/ML IJ SOLN
INTRAMUSCULAR | Status: DC | PRN
  Administered 2016-06-27 (×3): 25 mg via INTRAVENOUS

## 2016-06-27 MED ORDER — MIDAZOLAM HCL 5 MG/5ML IJ SOLN
INTRAMUSCULAR | Status: DC | PRN
  Administered 2016-06-27 (×3): 2 mg via INTRAVENOUS

## 2016-06-27 MED ORDER — STERILE WATER FOR IRRIGATION IR SOLN
Status: DC | PRN
  Administered 2016-06-27: 12:00:00

## 2016-06-27 MED ORDER — SODIUM CHLORIDE 0.9 % IV SOLN
INTRAVENOUS | Status: DC
  Administered 2016-06-27: 12:00:00 via INTRAVENOUS

## 2016-06-27 MED ORDER — LIDOCAINE VISCOUS 2 % MT SOLN
OROMUCOSAL | Status: AC
  Filled 2016-06-27: qty 15

## 2016-06-27 MED ORDER — MINERAL OIL PO OIL
TOPICAL_OIL | ORAL | Status: AC
  Filled 2016-06-27: qty 30

## 2016-06-27 MED ORDER — LIDOCAINE VISCOUS 2 % MT SOLN
OROMUCOSAL | Status: DC | PRN
  Administered 2016-06-27: 3 mL via OROMUCOSAL

## 2016-06-27 MED ORDER — MEPERIDINE HCL 100 MG/ML IJ SOLN
INTRAMUSCULAR | Status: AC
  Filled 2016-06-27: qty 2

## 2016-06-27 NOTE — Op Note (Signed)
[facility] Patient Name: Laurie Howell Procedure Date: 06/27/2016 12:05 PM MRN: [id number] Date of Birth: 1976/10/08 Attending MD: Barney Drain , MD CSN: [id number] Age: 40 Admit Type: Outpatient Procedure:                Upper GI endoscopy WITH COLD FORCEPS BIOPSY AND                            ESOPHAGEAL DILATION Indications:              Epigastric abdominal pain, Dysphagia Providers:                Barney Drain, MD, Otis Peak B. Sharon Seller, RN, Isabella Stalling, Technician Referring MD:              Medicines:                Meperidine 75 mg IV, Midazolam 6 mg IV Complications:            No immediate complications. Estimated Blood Loss:     Estimated blood loss was minimal. Procedure:                Pre-Anesthesia Assessment:                           - Prior to the procedure, a History and Physical                            was performed, and patient medications and                            allergies were reviewed. The patient's tolerance of                            previous anesthesia was also reviewed. The risks                            and benefits of the procedure and the sedation                            options and risks were discussed with the patient.                            All questions were answered, and informed consent                            was obtained. Prior Anticoagulants: The patient has                            taken no previous anticoagulant or antiplatelet                            agents. ASA Grade Assessment: I - A normal, healthy  patient. After reviewing the risks and benefits,                            the patient was deemed in satisfactory condition to                            undergo the procedure.                           After obtaining informed consent, the endoscope was                            passed under direct vision. Throughout the   procedure, the patient's blood pressure, pulse, and                            oxygen saturations were monitored continuously. The                            [id number]) scope was introduced through the                            mouth, and advanced to the second part of duodenum.                            The upper GI endoscopy was accomplished without                            difficulty. The patient tolerated the procedure                            well. Scope In: 12:27:18 PM Scope Out: 12:40:48 PM Total Procedure Duration: 0 hours 13 minutes 30 seconds  Findings:      Mucosal changes including ringed esophagus and longitudinal furrows were       found in the proximal esophagus. Biopsies were obtained from the       proximal(18 CM FROM INCISORS) and distal esophagus(34 CM FROM INCISORS)       with cold forceps for histology of suspected eosinophilic esophagitis. A       guidewire was placed and the scope was withdrawn. Dilation was performed       with a Savary dilator with moderate resistance at 16 mm and 17 mm. GE       JXN 39 CM FROM INCISORS      Diffuse moderate inflammation characterized by erythema, friability and       granularity was found in the gastric body and in the gastric antrum.       Biopsies were taken with a cold forceps for Helicobacter pylori testing       OR eosinophilib gastritis.      The examined duodenum was normal. Impression:               - DYSPHAGIA DUE TO GERD V. eosinophilic esophagitis.                           - MODERATE Gastritis. Moderate Sedation:  Moderate (conscious) sedation was administered by the endoscopy nurse       and supervised by the endoscopist. The following parameters were       monitored: oxygen saturation, heart rate, blood pressure, and response       to care. Total physician intraservice time was 25 minutes. Recommendation:           - Resume previous diet and low fat diet.                           - Continue  present medications.                           - Await pathology results.                           - Return to my office in 4 months.                           AVOID REFLUX TRIGGERS.                           CONTINUE PROTONIX. TAKE 30 MINUTES PRIOR TO                            BREAKFAST.                           - Patient has a contact number available for                            emergencies. The signs and symptoms of potential                            delayed complications were discussed with the                            patient. Return to normal activities tomorrow.                            Written discharge instructions were provided to the                            patient. Procedure Code(s):        --- Professional ---                           [phone], Esophagogastroduodenoscopy, flexible,                            transoral; with biopsy, single or multiple                           99152, Moderate sedation services provided by the                            same physician or other qualified health care  professional performing the diagnostic or                            therapeutic service that the sedation supports,                            requiring the presence of an independent trained                            observer to assist in the monitoring of the                            patient's level of consciousness and physiological                            status; initial 15 minutes of intraservice time,                            patient age 73 years or older                           [phone], Moderate sedation services; each additional                            15 minutes intraservice time Diagnosis Code(s):        --- Professional ---                           K20.9, Esophagitis, unspecified                           K29.70, Gastritis, unspecified, without bleeding                           R10.13, Epigastric pain                            R13.10, Dysphagia, unspecified CPT copyright 2016 [employer] Medical Association. All rights reserved. The codes documented in this report are preliminary and upon coder review may  be revised to meet current compliance requirements. Barney Drain, MD Barney Drain, MD 06/27/2016 1:54:04 PM This report has been signed electronically. Number of Addenda: 0

## 2016-06-27 NOTE — H&P (Signed)
  Primary Care Physician:  Evelina Dun, FNP Primary Gastroenterologist:  Dr. Oneida Alar  Pre-Procedure History & Physical: HPI:  Laurie Howell is a 40 y.o. female here for DYSPHAGIA/chest pain.  Past Medical History  Diagnosis Date  . Esophageal stricture   . Headache   . Anemia   . Frequent UTI   . GERD (gastroesophageal reflux disease)     Past Surgical History  Procedure Laterality Date  . Tubal ligation  1997  . Tubal reversal  02/2015    [facility] - Dr Karie Kirks    Prior to Admission medications   Medication Sig Start Date End Date Taking? Authorizing Provider  Iron Polysacch Cmplx-B12-FA 150-0.025-1 MG CAPS Take 1 mg by mouth once. Patient taking differently: Take 1 mg by mouth daily.  04/15/16  Yes Patrici Ranks, MD  lansoprazole (PREVACID) 15 MG capsule Take 15 mg by mouth daily at 12 noon.   Yes Historical Provider, MD  megestrol (MEGACE) 40 MG tablet 3 tablets a day for 5 days, 2 tablets a day for 5 days then 1 tablet daily 06/04/16  Yes Florian Buff, MD  pantoprazole (PROTONIX) 40 MG tablet Take 1 tablet (40 mg total) by mouth daily. 06/20/16  Yes Carlis Stable, NP    Allergies as of 06/20/2016 - Review Complete 06/20/2016  Allergen Reaction Noted  . Aspirin Swelling 07/09/2013  . Ivp dye [iodinated diagnostic agents]  07/09/2013    Family History  Problem Relation Age of Onset  . Depression Mother   . Hypertension Mother   . Heart disease Father   . Hypertension Father   . Anxiety disorder Father   . Heart attack Father   . Colon cancer Neg Hx     Social History   Social History  . Marital Status: Legally Separated    Spouse Name: N/A  . Number of Children: 2  . Years of Education: 8th   Occupational History  . N/A    Social History Main Topics  . Smoking status: Former Smoker    Types: Cigarettes    Quit date: 12/30/1994  . Smokeless tobacco: Never Used  . Alcohol Use: No  . Drug Use: No  . Sexual Activity: Yes    Birth Control/ Protection: None    Other Topics Concern  . Not on file   Social History Narrative    Review of Systems: See HPI, otherwise negative ROS   Physical Exam: BP 102/75 mmHg  Pulse 72  Temp(Src) 98 F (36.7 C) (Oral)  Resp 24  SpO2 100%  LMP 05/31/2016 General:   Alert,  pleasant and cooperative in NAD Head:  Normocephalic and atraumatic. Neck:  Supple; Lungs:  Clear throughout to auscultation.    Heart:  Regular rate and rhythm. Abdomen:  Soft, nontender and nondistended. Normal bowel sounds, without guarding, and without rebound.   Neurologic:  Alert and  oriented x4;  grossly normal neurologically.  Impression/Plan:     DYSPHAGIA/chest pain  Plan: 1. EGD/?DIL TODAY.

## 2016-06-27 NOTE — Discharge Instructions (Signed)
I dilated your esophagus. I DID NOT SEE A DEFINITE NARROWING IN YOUR esophagus. You have MODERATE gastritis. I biopsied your ESOPHAGUS AND stomach.   DRINK WATER TO KEEP YOUR URINE LIGHT YELLOW.  AVOID REFLUX TRIGGERS. SEE INFO BELOW.  CONTINUE PROTONIX. TAKE 30 MINUTES PRIOR TO BREAKFAST.  YOUR BIOPSY RESULTS WILL BE AVAILABLE IN MY CHART AFTER   JUL 3 AND MY OFFICE WILL CONTACT YOU IN 10-14 DAYS WITH YOUR RESULTS.   FOLLOW UP IN 4 MOS. UPPER ENDOSCOPY AFTER CARE Read the instructions outlined below and refer to this sheet in the next week. These discharge instructions provide you with general information on caring for yourself after you leave the hospital. While your treatment has been planned according to the most current medical practices available, unavoidable complications occasionally occur. If you have any problems or questions after discharge, call DR. Cadence Minton, [phone].  ACTIVITY  You may resume your regular activity, but move at a slower pace for the next 24 hours.   Take frequent rest periods for the next 24 hours.   Walking will help get rid of the air and reduce the bloated feeling in your belly (abdomen).   No driving for 24 hours (because of the medicine (anesthesia) used during the test).   You may shower.   Do not sign any important legal documents or operate any machinery for 24 hours (because of the anesthesia used during the test).    NUTRITION  Drink plenty of fluids.   You may resume your normal diet as instructed by your doctor.   Begin with a light meal and progress to your normal diet. Heavy or fried foods are harder to digest and may make you feel sick to your stomach (nauseated).   Avoid alcoholic beverages for 24 hours or as instructed.    MEDICATIONS  You may resume your normal medications.   WHAT YOU CAN EXPECT TODAY  Some feelings of bloating in the abdomen.   Passage of more gas than usual.    IF YOU HAD A BIOPSY TAKEN DURING  THE UPPER ENDOSCOPY:  Eat a soft diet IF YOU HAVE NAUSEA, BLOATING, ABDOMINAL PAIN, OR VOMITING.    FINDING OUT THE RESULTS OF YOUR TEST Not all test results are available during your visit. DR. Oneida Alar WILL CALL YOU WITHIN 14 DAYS OF YOUR PROCEDUE WITH YOUR RESULTS. Do not assume everything is normal if you have not heard from DR. Ayda Tancredi, CALL HER OFFICE AT [phone].  SEEK IMMEDIATE MEDICAL ATTENTION AND CALL THE OFFICE: [phone] IF:  You have more than a spotting of blood in your stool.   Your belly is swollen (abdominal distention).   You are nauseated or vomiting.   You have a temperature over 101F.   You have abdominal pain or discomfort that is severe or gets worse throughout the day.  Gastritis  Gastritis is an inflammation (the body's way of reacting to injury and/or infection) of the stomach. It is often caused by viral or bacterial (germ) infections. It can also be caused BY ASPIRIN, BC/GOODY POWDER'S, (IBUPROFEN) MOTRIN, OR ALEVE (NAPROXEN), chemicals (including alcohol), SPICY FOODS, and medications. This illness may be associated with generalized malaise (feeling tired, not well), UPPER ABDOMINAL STOMACH cramps, and fever. One common bacterial cause of gastritis is an organism known as H. Pylori. This can be treated with antibiotics.     DYSPHAGIA DYSPHAGIA can be caused by stomach acid backing up into the tube that carries food from the mouth down to the stomach (  lower esophagus).  TREATMENT There are a number of medicines used to treat DYSPHAGIA including: Antacids.  Proton-pump inhibitors: PROTONIX  HOME CARE INSTRUCTIONS Eat 2-3 hours before going to bed.  Try to reach and maintain a healthy weight.  Do not eat just a few very large meals. Instead, eat 4 TO 6 smaller meals throughout the day.  Try to identify foods and beverages that make your symptoms worse, and avoid these.  Avoid tight clothing.  Do not exercise right after eating.   Lifestyle and  home remedies TO HELP CONTROL HEARTBURN.  You may eliminate or reduce the frequency of heartburn by making the following lifestyle changes:   Control your weight. Being overweight is a major risk factor for heartburn and GERD. Excess pounds put pressure on your abdomen, pushing up your stomach and causing acid to back up into your esophagus.    Eat smaller meals. 4 TO 6 MEALS A DAY. This reduces pressure on the lower esophageal sphincter, helping to prevent the valve from opening and acid from washing back into your esophagus.    Loosen your belt. Clothes that fit tightly around your waist put pressure on your abdomen and the lower esophageal sphincter.     Eliminate heartburn triggers. Everyone has specific triggers. Common triggers such as fatty or fried foods, spicy food, tomato sauce, carbonated beverages, alcohol, chocolate, mint, garlic, onion, caffeine and nicotine may make heartburn worse.    Avoid stooping or bending. Tying your shoes is OK. Bending over for longer periods to weed your garden isn't, especially soon after eating.    Don't lie down after a meal. Wait at least three to four hours after eating before going to bed, and don't lie down right after eating.    PLACE THE HEAD OF YOUR BED ON 6 INCH BLOCKS.  Alternative medicine  Several home remedies exist for treating GERD, but they provide only temporary relief. They include drinking baking soda (sodium bicarbonate) added to water or drinking other fluids such as baking soda mixed with cream of tartar and water.  Although these liquids create temporary relief by neutralizing, washing away or buffering acids, eventually they aggravate the situation by adding gas and fluid to your stomach, increasing pressure and causing more acid reflux. Further, adding more sodium to your diet may increase your blood pressure and add stress to your heart, and excessive bicarbonate ingestion can alter the acid-base balance in your  body.

## 2016-06-27 NOTE — Progress Notes (Signed)
REVIEWED-NO ADDITIONAL RECOMMENDATIONS. 

## 2016-07-01 ENCOUNTER — Encounter (HOSPITAL_COMMUNITY): Payer: Self-pay | Admitting: Gastroenterology

## 2016-07-04 ENCOUNTER — Ambulatory Visit: Payer: Medicaid Other | Admitting: Obstetrics & Gynecology

## 2016-07-09 ENCOUNTER — Encounter (HOSPITAL_COMMUNITY): Payer: Self-pay | Admitting: Oncology

## 2016-07-09 NOTE — Assessment & Plan Note (Deleted)
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  She underwent EGD by Dr. Oneida Alar on 06/27/2016 for dysphagia showing GERD and moderate gastritis.   Oncology Flowsheet 04/17/2016 06/06/2016  Ferric Carboxymaltose IV [ 750 mg ]   ferumoxytol (FERAHEME) IV  510 mg   Labs today: CBC diff, ferritin.  I personally reviewed and went over laboratory results with the patient.  The results are noted within this dictation.  Ferritin is pending at this time and we will keep the patient apprised of the result when available for review.  If further IV iron replacement is needed, we will address.  Chart is reviewed.  She has started Megace for her vaginal bleeding by Gyn, Dr. Elonda Husky.  She underwent EGD by Dr. Oneida Alar in June 2017 showing moderate gastritis.  Biopsy confirms chronic gastritis without evidence for H. pylori.  I personally reviewed and went over pathology results with the patient.  Labs in 6 and 12 weeks: CBC, ferritin.  Return in 12 weeks for follow-up with continued IV iron replacement when indicated.

## 2016-07-09 NOTE — Progress Notes (Signed)
   ROS  This encounter was created in error - please disregard. 

## 2016-07-10 ENCOUNTER — Encounter (HOSPITAL_COMMUNITY): Payer: Medicaid Other | Admitting: Oncology

## 2016-07-10 ENCOUNTER — Encounter (HOSPITAL_COMMUNITY): Payer: Medicaid Other | Attending: Hematology & Oncology

## 2016-07-10 DIAGNOSIS — Z9889 Other specified postprocedural states: Secondary | ICD-10-CM | POA: Insufficient documentation

## 2016-07-10 DIAGNOSIS — Z79899 Other long term (current) drug therapy: Secondary | ICD-10-CM | POA: Diagnosis not present

## 2016-07-10 DIAGNOSIS — N920 Excessive and frequent menstruation with regular cycle: Secondary | ICD-10-CM | POA: Insufficient documentation

## 2016-07-10 DIAGNOSIS — Z87891 Personal history of nicotine dependence: Secondary | ICD-10-CM | POA: Insufficient documentation

## 2016-07-10 DIAGNOSIS — D509 Iron deficiency anemia, unspecified: Secondary | ICD-10-CM | POA: Diagnosis not present

## 2016-07-10 LAB — CBC WITH DIFFERENTIAL/PLATELET
BASOS ABS: 0 10*3/uL (ref 0.0–0.1)
BASOS PCT: 1 %
EOS ABS: 0.1 10*3/uL (ref 0.0–0.7)
EOS PCT: 2 %
HEMATOCRIT: 42.3 % (ref 36.0–46.0)
Hemoglobin: 14.4 g/dL (ref 12.0–15.0)
Lymphocytes Relative: 30 %
Lymphs Abs: 1.6 10*3/uL (ref 0.7–4.0)
MCH: 30.2 pg (ref 26.0–34.0)
MCHC: 34 g/dL (ref 30.0–36.0)
MCV: 88.7 fL (ref 78.0–100.0)
MONO ABS: 0.4 10*3/uL (ref 0.1–1.0)
MONOS PCT: 8 %
Neutro Abs: 3.2 10*3/uL (ref 1.7–7.7)
Neutrophils Relative %: 59 %
PLATELETS: 206 10*3/uL (ref 150–400)
RBC: 4.77 MIL/uL (ref 3.87–5.11)
RDW: 16.3 % — AB (ref 11.5–15.5)
WBC: 5.4 10*3/uL (ref 4.0–10.5)

## 2016-07-10 LAB — FERRITIN: FERRITIN: 57 ng/mL (ref 11–307)

## 2016-07-16 ENCOUNTER — Telehealth: Payer: Self-pay | Admitting: Gastroenterology

## 2016-07-16 NOTE — Telephone Encounter (Signed)
Please call pt. HER stomach Bx shows gastritis. THE ESOPHAGUS BIOPSIES ARE NORMAL.   DRINK WATER TO KEEP YOUR URINE LIGHT YELLOW.  AVOID REFLUX TRIGGERS.   CONTINUE PROTONIX. TAKE 30 MINUTES PRIOR TO BREAKFAST.  FOLLOW UP IN 4 MOS E30 DYSPHAGIA/GASTRITIS/GERD.

## 2016-07-17 NOTE — Telephone Encounter (Signed)
Tried to call with no answer  

## 2016-07-18 ENCOUNTER — Other Ambulatory Visit (HOSPITAL_COMMUNITY): Payer: Self-pay | Admitting: Hematology & Oncology

## 2016-07-18 NOTE — Telephone Encounter (Signed)
PT is aware of results.  

## 2016-07-18 NOTE — Telephone Encounter (Signed)
LMOM to call.

## 2016-07-19 ENCOUNTER — Other Ambulatory Visit: Payer: Self-pay | Admitting: Family Medicine

## 2016-07-19 ENCOUNTER — Telehealth (HOSPITAL_COMMUNITY): Payer: Self-pay

## 2016-07-19 NOTE — Telephone Encounter (Signed)
I have spoke with patient to tell her that we cannot do labs at this time because its too soon , she had them done too recently.

## 2016-07-22 ENCOUNTER — Encounter: Payer: Self-pay | Admitting: Family

## 2016-07-22 ENCOUNTER — Ambulatory Visit (INDEPENDENT_AMBULATORY_CARE_PROVIDER_SITE_OTHER): Payer: Medicaid Other | Admitting: Family

## 2016-07-22 ENCOUNTER — Ambulatory Visit (INDEPENDENT_AMBULATORY_CARE_PROVIDER_SITE_OTHER): Payer: Medicaid Other

## 2016-07-22 VITALS — BP 113/73 | HR 90 | Temp 98.1°F | Ht 62.0 in | Wt 126.4 lb

## 2016-07-22 DIAGNOSIS — K5900 Constipation, unspecified: Secondary | ICD-10-CM

## 2016-07-22 DIAGNOSIS — D509 Iron deficiency anemia, unspecified: Secondary | ICD-10-CM

## 2016-07-22 DIAGNOSIS — R42 Dizziness and giddiness: Secondary | ICD-10-CM | POA: Diagnosis not present

## 2016-07-22 DIAGNOSIS — R1030 Lower abdominal pain, unspecified: Secondary | ICD-10-CM

## 2016-07-22 NOTE — Patient Instructions (Addendum)
Abdominal Pain, Adult °Many things can cause abdominal pain. Usually, abdominal pain is not caused by a disease and will improve without treatment. It can often be observed and treated at home. Your health care provider will do a physical exam and possibly order blood tests and X-rays to help determine the seriousness of your pain. However, in many cases, more time must pass before a clear cause of the pain can be found. Before that point, your health care provider may not know if you need more testing or further treatment. °HOME CARE INSTRUCTIONS °Monitor your abdominal pain for any changes. The following actions may help to alleviate any discomfort you are experiencing: °· Only take over-the-counter or prescription medicines as directed by your health care provider. °· Do not take laxatives unless directed to do so by your health care provider. °· Try a clear liquid diet (broth, tea, or water) as directed by your health care provider. Slowly move to a bland diet as tolerated. °SEEK MEDICAL CARE IF: °· You have unexplained abdominal pain. °· You have abdominal pain associated with nausea or diarrhea. °· You have pain when you urinate or have a bowel movement. °· You experience abdominal pain that wakes you in the night. °· You have abdominal pain that is worsened or improved by eating food. °· You have abdominal pain that is worsened with eating fatty foods. °· You have a fever. °SEEK IMMEDIATE MEDICAL CARE IF: °· Your pain does not go away within 2 hours. °· You keep throwing up (vomiting). °· Your pain is felt only in portions of the abdomen, such as the right side or the left lower portion of the abdomen. °· You pass bloody or black tarry stools. °MAKE SURE YOU: °· Understand these instructions. °· Will watch your condition. °· Will get help right away if you are not doing well or get worse. °  °This information is not intended to replace advice given to you by your health care provider. Make sure you discuss  any questions you have with your health care provider. °  °Document Released: 09/25/2005 Document Revised: 09/06/2015 Document Reviewed: 08/25/2013 °Elsevier Interactive Patient Education ©2016 [employer] ° °Constipation, Adult °Constipation is when a person has fewer than three bowel movements a week, has difficulty having a bowel movement, or has stools that are dry, hard, or larger than normal. As people grow older, constipation is more common. A low-fiber diet, not taking in enough fluids, and taking certain medicines may make constipation worse.  °CAUSES  °· Certain medicines, such as antidepressants, pain medicine, iron supplements, antacids, and water pills.   °· Certain diseases, such as diabetes, irritable bowel syndrome (IBS), thyroid disease, or depression.   °· Not drinking enough water.   °· Not eating enough fiber-rich foods.   °· Stress or travel.   °· Lack of physical activity or exercise.   °· Ignoring the urge to have a bowel movement.   °· Using laxatives too much.   °SIGNS AND SYMPTOMS  °· Having fewer than three bowel movements a week.   °· Straining to have a bowel movement.   °· Having stools that are hard, dry, or larger than normal.   °· Feeling full or bloated.   °· Pain in the lower abdomen.   °· Not feeling relief after having a bowel movement.   °DIAGNOSIS  °Your health care provider will take a medical history and perform a physical exam. Further testing may be done for severe constipation. Some tests may include: °· A barium enema X-ray to examine your rectum, colon, and, sometimes,   your small intestine.   °· A sigmoidoscopy to examine your lower colon.   °· A colonoscopy to examine your entire colon. °TREATMENT  °Treatment will depend on the severity of your constipation and what is causing it. Some dietary treatments include drinking more fluids and eating more fiber-rich foods. Lifestyle treatments may include regular exercise. If these diet and lifestyle recommendations do not  help, your health care provider may recommend taking over-the-counter laxative medicines to help you have bowel movements. Prescription medicines may be prescribed if over-the-counter medicines do not work.  °HOME CARE INSTRUCTIONS  °· Eat foods that have a lot of fiber, such as fruits, vegetables, whole grains, and beans. °· Limit foods high in fat and processed sugars, such as french fries, hamburgers, cookies, candies, and soda.   °· A fiber supplement may be added to your diet if you cannot get enough fiber from foods.   °· Drink enough fluids to keep your urine clear or pale yellow.   °· Exercise regularly or as directed by your health care provider.   °· Go to the restroom when you have the urge to go. Do not hold it.   °· Only take over-the-counter or prescription medicines as directed by your health care provider. Do not take other medicines for constipation without talking to your health care provider first.   °SEEK IMMEDIATE MEDICAL CARE IF:  °· You have bright red blood in your stool.   °· Your constipation lasts for more than 4 days or gets worse.   °· You have abdominal or rectal pain.   °· You have thin, pencil-like stools.   °· You have unexplained weight loss. °MAKE SURE YOU:  °· Understand these instructions. °· Will watch your condition. °· Will get help right away if you are not doing well or get worse. °  °This information is not intended to replace advice given to you by your health care provider. Make sure you discuss any questions you have with your health care provider. °  °Document Released: 09/13/2004 Document Revised: 01/06/2015 Document Reviewed: 09/27/2013 °Elsevier Interactive Patient Education ©2016 [employer] ° °

## 2016-07-22 NOTE — Progress Notes (Signed)
   Subjective:    Patient ID: Laurie Howell, female    DOB: May 16, 1976, 40 y.o.   MRN: [id number]  PT presents to the office today with lower abdominal pain. PT has had dysfunctional uterine bleeding and was told she need surgical management. PT does not want this at this time because she was hoping to become pregnant.  Pt states she is currently is not having any bleeding at this time, but states she usually has one week without and then one week with heavy bleeding. Pt had an EGD with dilation on 06/27/16.  Abdominal Pain  This is a new problem. The current episode started in the past 7 days. The onset quality is sudden. The problem occurs intermittently. The problem has been gradually worsening. The pain is located in the RLQ and LLQ. The pain is at a severity of 9/10. The pain is moderate. The quality of the pain is burning and sharp. Associated symptoms include headaches. Pertinent negatives include no belching, constipation, diarrhea, dysuria, fever, frequency, hematuria, nausea or vomiting. Nothing aggravates the pain. The pain is relieved by nothing. She has tried acetaminophen for the symptoms. The treatment provided no relief.  Headache   Associated symptoms include abdominal pain and dizziness. Pertinent negatives include no fever, nausea or vomiting.  Dizziness  Associated symptoms include abdominal pain and headaches. Pertinent negatives include no fever, nausea or vomiting.      Review of Systems  Constitutional: Negative for fever.  Gastrointestinal: Positive for abdominal pain. Negative for constipation, diarrhea, nausea and vomiting.  Genitourinary: Negative for dysuria, frequency and hematuria.  Neurological: Positive for dizziness and headaches.       Objective:   Physical Exam  Constitutional: She is oriented to person, place, and time. She appears well-developed and well-nourished. No distress.  HENT:  Head: Normocephalic.  Eyes: Pupils are equal, round, and reactive  to light.  Neck: Normal range of motion. Neck supple. No thyromegaly present.  Cardiovascular: Normal rate, regular rhythm, normal heart sounds and intact distal pulses.   No murmur heard. Pulmonary/Chest: Effort normal and breath sounds normal. No respiratory distress. She has no wheezes.  Abdominal: Soft. Bowel sounds are normal. She exhibits distension. There is tenderness. There is guarding.  Musculoskeletal: Normal range of motion. She exhibits no edema or tenderness.  Neurological: She is alert and oriented to person, place, and time.  Skin: Skin is warm and dry.  Psychiatric: She has a normal mood and affect. Her behavior is normal. Judgment and thought content normal.  Vitals reviewed.   KUB- Large amount of stool in colon Preliminary reading by Evelina Dun, FNP [facility]   BP 113/73   Pulse 90   Temp 98.1 F (36.7 C) (Oral)   Ht 5\' 2"  (1.575 m)   Wt 126 lb 6.4 oz (57.3 kg)   BMI 23.12 kg/m      Assessment & Plan:  1. Lower abdominal pain - DG Abd 1 View; Future - CBC with Differential/Platelet  2. Dizziness - CBC with Differential/Platelet  3. Iron deficiency anemia - CBC with Differential/Platelet  4. Constipation, unspecified constipation type -PT to take miralax BID for next 2-3 day Force fluids Exercise RTO in 2-3 days if symptoms do not improve  Labs pending  Evelina Dun, FNP

## 2016-07-23 ENCOUNTER — Encounter (HOSPITAL_COMMUNITY): Payer: Self-pay

## 2016-07-24 ENCOUNTER — Ambulatory Visit: Payer: Medicaid Other | Admitting: Family

## 2016-07-25 ENCOUNTER — Encounter: Payer: Self-pay | Admitting: Family

## 2016-08-06 ENCOUNTER — Encounter (HOSPITAL_COMMUNITY): Payer: Medicaid Other | Attending: Hematology & Oncology

## 2016-08-06 VITALS — BP 116/72 | HR 68 | Temp 98.0°F | Resp 18 | Wt 126.0 lb

## 2016-08-06 DIAGNOSIS — Z79899 Other long term (current) drug therapy: Secondary | ICD-10-CM | POA: Insufficient documentation

## 2016-08-06 DIAGNOSIS — D509 Iron deficiency anemia, unspecified: Secondary | ICD-10-CM

## 2016-08-06 DIAGNOSIS — N921 Excessive and frequent menstruation with irregular cycle: Secondary | ICD-10-CM | POA: Diagnosis not present

## 2016-08-06 DIAGNOSIS — Z87891 Personal history of nicotine dependence: Secondary | ICD-10-CM | POA: Insufficient documentation

## 2016-08-06 DIAGNOSIS — Z9889 Other specified postprocedural states: Secondary | ICD-10-CM | POA: Insufficient documentation

## 2016-08-06 DIAGNOSIS — N920 Excessive and frequent menstruation with regular cycle: Secondary | ICD-10-CM | POA: Insufficient documentation

## 2016-08-06 MED ORDER — SODIUM CHLORIDE 0.9 % IV SOLN
INTRAVENOUS | Status: DC
  Administered 2016-08-06: 15:00:00 via INTRAVENOUS

## 2016-08-06 MED ORDER — SODIUM CHLORIDE 0.9 % IV SOLN
510.0000 mg | Freq: Once | INTRAVENOUS | Status: AC
  Administered 2016-08-06: 510 mg via INTRAVENOUS
  Filled 2016-08-06: qty 17

## 2016-08-06 NOTE — Patient Instructions (Signed)
[facility] at [facility] Discharge Instructions  RECOMMENDATIONS MADE BY THE CONSULTANT AND ANY TEST RESULTS WILL BE SENT TO YOUR REFERRING PHYSICIAN.  IV iron today.    Thank you for choosing [facility] at [facility] to provide your oncology and hematology care.  To afford each patient quality time with our provider, please arrive at least 15 minutes before your scheduled appointment time.   Beginning January 23rd 2017 lab work for the Cancer Center will be done in the  Main lab at [facility] on 1st floor. If you have a lab appointment with the Cancer Center please come in thru the  Main Entrance and check in at the main information desk  You need to re-schedule your appointment should you arrive 10 or more minutes late.  We strive to give you quality time with our providers, and arriving late affects you and other patients whose appointments are after yours.  Also, if you no show three or more times for appointments you may be dismissed from the clinic at the providers discretion.     Again, thank you for choosing [facility].  Our hope is that these requests will decrease the amount of time that you wait before being seen by our physicians.       _____________________________________________________________  Should you have questions after your visit to [facility], please contact our office at [phone] between the hours of 8:30 a.m. and 4:30 p.m.  Voicemails left after 4:30 p.m. will not be returned until the following business day.  For prescription refill requests, have your pharmacy contact our office.         Resources For Cancer Patients and their Caregivers ? American Cancer Society: Can assist with transportation, wigs, general needs, runs Look Good Feel Better.        1-888-227-6333 ? Cancer Care: Provides financial assistance, online support groups, medication/co-pay assistance.  1-800-813-HOPE  (4673) ? [facility] Assists [place] cancer patients and their families through emotional , educational and financial support.  336-427-4357 ? [place] DSS Where to apply for food stamps, Medicaid and utility assistance. 336-342-1394 ? [facility]: Transportation to medical appointments. 336-347-2287 ? Social Security Administration: May apply for disability if have a Stage IV cancer. 336-342-7796 1-800-772-1213 ? [place] Aging, Disability and Transit Services: Assists with nutrition, care and transit needs. 336-349-2343  Cancer Center Support Programs: @[email or web address]@ > Cancer Support Group  2nd Tuesday of the month 1pm-2pm, Journey Room  > Creative Journey  3rd Tuesday of the month 1130am-1pm, Journey Room  > Look Good Feel Better  1st Wednesday of the month 10am-12 noon, Journey Room (Call American Cancer Society to register 1-800-395-5775)    

## 2016-08-06 NOTE — Progress Notes (Signed)
Patient tolerated infusion well.  VSS.   

## 2016-08-13 ENCOUNTER — Ambulatory Visit (HOSPITAL_COMMUNITY): Payer: Medicaid Other | Admitting: Oncology

## 2016-08-29 ENCOUNTER — Ambulatory Visit (HOSPITAL_COMMUNITY): Payer: Medicaid Other | Admitting: Oncology

## 2016-08-29 NOTE — Assessment & Plan Note (Deleted)
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  She underwent EGD by Dr. Oneida Alar on 06/27/2016 for dysphagia showing GERD and moderate gastritis.  Oncology Flowsheet 08/06/2016  ferumoxytol [facility]) IV 510 mg     Labs today: CBC diff, ferritin.  I personally reviewed and went over laboratory results with the patient.  The results are noted within this dictation.  Ferritin is pending at this time and we will keep the patient apprised of the result when available for review.  If further IV iron replacement is needed, we will address.  Chart is reviewed.  She has started Megace for her vaginal bleeding by Gyn, Dr. Elonda Husky.  She underwent EGD by Dr. Oneida Alar in June 2017 showing moderate gastritis.  Biopsy confirms chronic gastritis without evidence for H. pylori.  I personally reviewed and went over pathology results with the patient.  Labs in 6 and 12 weeks: CBC, ferritin.  Return in 12 weeks for follow-up with continued IV iron replacement when indicated.

## 2016-08-29 NOTE — Progress Notes (Deleted)
Laurie Howell, [place] [address]  Iron deficiency anemia  CURRENT THERAPY:  INTERVAL HISTORY: Laurie Howell 40 y.o. female returns for followup of iron deficiency anemia secondary to menorrhagia and chronic anovulatory bleeding.  She underwent EGD by Dr. Oneida Alar on 06/27/2016 for dysphagia showing GERD and moderate gastritis.  ROS  Past Medical History:  Diagnosis Date  . Anemia   . Esophageal stricture   . Frequent UTI   . GERD (gastroesophageal reflux disease)   . Headache   . Iron deficiency anemia 03/28/2016    Past Surgical History:  Procedure Laterality Date  . ESOPHAGOGASTRODUODENOSCOPY N/A 06/27/2016   Procedure: ESOPHAGOGASTRODUODENOSCOPY (EGD);  Surgeon: Danie Binder, MD;  [facility];  Service: Endoscopy;  Laterality: N/A;  1245  . SAVORY DILATION N/A 06/27/2016   Procedure: SAVORY DILATION;  Surgeon: Danie Binder, MD;  Location: [facility];  Service: Endoscopy;  Laterality: N/A;  . TUBAL LIGATION  1997  . Tubal reversal  02/2015   [facility] - Dr Karie Kirks    Family History  Problem Relation Age of Onset  . Depression Mother   . Hypertension Mother   . Heart disease Father   . Hypertension Father   . Anxiety disorder Father   . Heart attack Father   . Colon cancer Neg Hx     Social History   Social History  . Marital status: Legally Separated    Spouse name: N/A  . Number of children: 2  . Years of education: 8th   Occupational History  . N/A    Social History Main Topics  . Smoking status: Former Smoker    Types: Cigarettes    Quit date: 12/30/1994  . Smokeless tobacco: Never Used  . Alcohol use No  . Drug use: No  . Sexual activity: Yes    Birth control/ protection: None   Other Topics Concern  . Not on file   Social History Narrative  . No narrative on file     PHYSICAL EXAMINATION  ECOG PERFORMANCE STATUS: {CHL ONC ECOG [phone]}  There were no vitals filed for this  visit.  GENERAL:{CHL ONC PE GENERAL:[phone]} SKIN: {CHL ONC PE JI:[id number] HEAD: {CHL ONC PE DC:184310 EYES: {CHL ONC PE WX:[id number] EARS: {CHL ONC PE DJ:[id number] OROPHARYNX:{CHL ONC PE OROPHARYNX:[id number]}  NECK: {CHL ONC PE OK:[id number] LYMPH:  {CHL ONC PE SG:[id number] BREAST:{CHL ONC PE BREAST:[phone]} LUNGS: {CHL ONC PE CW:[id number] HEART: {CHL ONC PE TN:[id number] ABDOMEN:{CHL ONC PE ABDOMEN:[phone]} BACK: {CHL ONC PE EV:[id number] EXTREMITIES:{CHL ONC PE EXTREMITIES:254 108 7081}  NEURO: {CHL ONC PE NEURO:[phone]} PELVIC:{CHL ONC PE PELVIC:[phone]} RECTAL: {CHL ONC PE RECTAL:[phone]}   LABORATORY DATA: CBC    Component Value Date/Time   WBC 5.4 07/10/2016 1335   RBC 4.77 07/10/2016 1335   HGB 14.4 07/10/2016 1335   HCT 42.3 07/10/2016 1335   HCT 28.9 (L) 03/28/2016 1250   PLT 206 07/10/2016 1335   PLT 362 03/28/2016 1250   MCV 88.7 07/10/2016 1335   MCV 70 (L) 03/28/2016 1250   MCH 30.2 07/10/2016 1335   MCHC 34.0 07/10/2016 1335   RDW 16.3 (H) 07/10/2016 1335   RDW 16.9 (H) 03/28/2016 1250   LYMPHSABS 1.6 07/10/2016 1335   LYMPHSABS 1.3 03/28/2016 1250   MONOABS 0.4 07/10/2016 1335   EOSABS 0.1 07/10/2016 1335   EOSABS 0.1 03/28/2016 1250   BASOSABS 0.0 07/10/2016 1335   BASOSABS 0.0 03/28/2016 1250      Chemistry  Component Value Date/Time   NA 140 03/28/2016 1250   K 4.0 03/28/2016 1250   CL 104 03/28/2016 1250   CO2 21 03/28/2016 1250   BUN 8 03/28/2016 1250   CREATININE 0.71 03/28/2016 1250      Component Value Date/Time   CALCIUM 9.6 03/28/2016 1250   ALKPHOS 68 03/28/2016 1250   AST 20 03/28/2016 1250   ALT 10 03/28/2016 1250   BILITOT 0.3 03/28/2016 1250        PENDING LABS:   RADIOGRAPHIC STUDIES:  No results found.   PATHOLOGY:    ASSESSMENT AND PLAN:  Iron deficiency anemia Iron deficiency anemia secondary to menorrhagia and chronic anovulatory bleeding.   She underwent EGD by Dr. Oneida Alar on 06/27/2016 for dysphagia showing GERD and moderate gastritis.  Oncology Flowsheet 08/06/2016  ferumoxytol [facility]) IV 510 mg     Labs today: CBC diff, ferritin.  I personally reviewed and went over laboratory results with the patient.  The results are noted within this dictation.  Ferritin is pending at this time and we will keep the patient apprised of the result when available for review.  If further IV iron replacement is needed, we will address.  Chart is reviewed.  She has started Megace for her vaginal bleeding by Gyn, Dr. Elonda Husky.  She underwent EGD by Dr. Oneida Alar in June 2017 showing moderate gastritis.  Biopsy confirms chronic gastritis without evidence for H. pylori.  I personally reviewed and went over pathology results with the patient.  Labs in 6 and 12 weeks: CBC, ferritin.  Return in 12 weeks for follow-up with continued IV iron replacement when indicated.    ORDERS PLACED FOR THIS ENCOUNTER: No orders of the defined types were placed in this encounter.   MEDICATIONS PRESCRIBED THIS ENCOUNTER: No orders of the defined types were placed in this encounter.   THERAPY PLAN:  We will continue to monitor blood counts and iron stores with IV iron replacement when indicated.  All questions were answered. The patient knows to call the clinic with any problems, questions or concerns. We can certainly see the patient much sooner if necessary.  Patient and plan discussed with Dr. Ancil Linsey and she is in agreement with the aforementioned.   This note is electronically signed by: Doy Mince 08/29/2016 8:14 AM

## 2016-09-23 ENCOUNTER — Telehealth: Payer: Self-pay | Admitting: Gastroenterology

## 2016-09-23 ENCOUNTER — Ambulatory Visit: Payer: Medicaid Other | Admitting: Gastroenterology

## 2016-09-23 ENCOUNTER — Encounter: Payer: Self-pay | Admitting: Gastroenterology

## 2016-09-23 NOTE — Telephone Encounter (Signed)
PATIENT WAS A NO SHOW AND LETTER SENT  °

## 2016-09-26 ENCOUNTER — Encounter: Payer: Self-pay | Admitting: Family

## 2016-09-26 ENCOUNTER — Ambulatory Visit (INDEPENDENT_AMBULATORY_CARE_PROVIDER_SITE_OTHER): Payer: Medicaid Other | Admitting: Family

## 2016-09-26 VITALS — BP 108/74 | HR 80 | Temp 97.5°F | Ht 62.0 in | Wt 130.2 lb

## 2016-09-26 DIAGNOSIS — D509 Iron deficiency anemia, unspecified: Secondary | ICD-10-CM | POA: Diagnosis not present

## 2016-09-26 DIAGNOSIS — K2100 Gastro-esophageal reflux disease with esophagitis, without bleeding: Secondary | ICD-10-CM

## 2016-09-26 DIAGNOSIS — R42 Dizziness and giddiness: Secondary | ICD-10-CM

## 2016-09-26 DIAGNOSIS — R5383 Other fatigue: Secondary | ICD-10-CM | POA: Diagnosis not present

## 2016-09-26 DIAGNOSIS — N921 Excessive and frequent menstruation with irregular cycle: Secondary | ICD-10-CM

## 2016-09-26 DIAGNOSIS — Z1239 Encounter for other screening for malignant neoplasm of breast: Secondary | ICD-10-CM

## 2016-09-26 NOTE — Patient Instructions (Signed)

## 2016-09-26 NOTE — Progress Notes (Signed)
   Subjective:    Patient ID: Laurie Howell, female    DOB: [birth year], 40 y.o.   MRN: [id number]  Pt presents to the office today for chronic follow up. PT had a reversal of a tubal ligation one year ago. Pt states since then she has had menorrhea and dysmenorrhea. She states she goes through 10 tampons a day. PT has seen a GYN and recommended a hysterectomy, but pt states she wants to try to become pregnant.        Gastroesophageal Reflux  She reports no belching, no coughing, no dysphagia, no heartburn or no sore throat. This is a chronic problem. The current episode started more than 1 year ago. The problem occurs rarely. The problem has been resolved. The symptoms are aggravated by lying down. She has tried a PPI for the symptoms. The treatment provided significant relief.  Anemia  Presents for follow-up visit. Symptoms include light-headedness and malaise/fatigue. There has been no bruising/bleeding easily.      Review of Systems  Constitutional: Positive for malaise/fatigue.  HENT: Negative for sore throat.   Respiratory: Negative for cough.   Gastrointestinal: Negative for dysphagia and heartburn.  Neurological: Positive for light-headedness.  Hematological: Does not bruise/bleed easily.  All other systems reviewed and are negative.      Objective:   Physical Exam  Constitutional: She is oriented to person, place, and time. She appears well-developed and well-nourished. No distress.  HENT:  Head: Normocephalic and atraumatic.  Right Ear: External ear normal.  Left Ear: External ear normal.  Nose: Nose normal.  Mouth/Throat: Oropharynx is clear and moist.  Eyes: Pupils are equal, round, and reactive to light.  Neck: Normal range of motion. Neck supple. No thyromegaly present.  Cardiovascular: Normal rate, regular rhythm, normal heart sounds and intact distal pulses.   No murmur heard. Pulmonary/Chest: Effort normal and breath sounds normal. No respiratory distress. She  has no wheezes.  Abdominal: Soft. Bowel sounds are normal. She exhibits no distension. There is no tenderness.  Musculoskeletal: Normal range of motion. She exhibits no edema or tenderness.  Neurological: She is alert and oriented to person, place, and time. She has normal reflexes. No cranial nerve deficit.  Skin: Skin is warm and dry.  Psychiatric: She has a normal mood and affect. Her behavior is normal. Judgment and thought content normal.  Vitals reviewed.   BP 108/74   Pulse 80   Temp 97.5 F (36.4 C) (Oral)   Ht '5\' 2"'$  (1.575 m)   Wt 130 lb 3.2 oz (59.1 kg)   LMP 09/26/2016 (Approximate)   BMI 23.81 kg/m        Assessment & Plan:  1. Iron deficiency anemia -Increase iron rich foods, continue iron tablets - Anemia Profile B - CMP14+EGFR - Ambulatory referral to Gynecology  2. Gastroesophageal reflux disease with esophagitis - CMP14+EGFR  3. Light headedness - Anemia Profile B  4. Other fatigue - Anemia Profile B  5. Breast cancer screening - MM DIGITAL SCREENING BILATERAL; Future  6. Menorrhagia with irregular cycle - Ambulatory referral to Gynecology   Continue all meds, pt to follow up with GYN Labs pending Health Maintenance reviewed Diet and exercise encouraged RTO 6 months  Evelina Dun, FNP

## 2016-09-27 LAB — ANEMIA PROFILE B
BASOS ABS: 0 10*3/uL (ref 0.0–0.2)
Basos: 1 %
EOS (ABSOLUTE): 0.1 10*3/uL (ref 0.0–0.4)
Eos: 2 %
Ferritin: 157 ng/mL — ABNORMAL HIGH (ref 15–150)
Folate: >20 ng/mL (ref >3.0)
HEMATOCRIT: 41.4 % (ref 34.0–46.6)
Hemoglobin: 14.4 g/dL (ref 11.1–15.9)
IMMATURE GRANS (ABS): 0 10*3/uL (ref 0.0–0.1)
Immature Granulocytes: 0 %
Iron Saturation: 55 % (ref 15–55)
Iron: 161 ug/dL — ABNORMAL HIGH (ref 27–159)
LYMPHS: 28 %
Lymphocytes Absolute: 1.6 10*3/uL (ref 0.7–3.1)
MCH: 32.9 pg (ref 26.6–33.0)
MCHC: 34.8 g/dL (ref 31.5–35.7)
MCV: 95 fL (ref 79–97)
MONOCYTES: 7 %
Monocytes Absolute: 0.4 10*3/uL (ref 0.1–0.9)
Neutrophils Absolute: 3.5 10*3/uL (ref 1.4–7.0)
Neutrophils: 62 %
PLATELETS: 267 10*3/uL (ref 150–379)
RBC: 4.38 x10E6/uL (ref 3.77–5.28)
RDW: 13 % (ref 12.3–15.4)
RETIC CT PCT: 0.8 % (ref 0.6–2.6)
TIBC: 293 ug/dL (ref 250–450)
UIBC: 132 ug/dL (ref 131–425)
Vitamin B-12: 621 pg/mL (ref 211–946)
WBC: 5.7 10*3/uL (ref 3.4–10.8)

## 2016-09-27 LAB — CMP14+EGFR
ALT: 12 IU/L (ref 0–32)
AST: 17 IU/L (ref 0–40)
Albumin/Globulin Ratio: 1.4 (ref 1.2–2.2)
Albumin: 4.3 g/dL (ref 3.5–5.5)
Alkaline Phosphatase: 80 IU/L (ref 39–117)
BUN / CREAT RATIO: 11 (ref 9–23)
BUN: 8 mg/dL (ref 6–24)
Bilirubin Total: 0.6 mg/dL (ref 0.0–1.2)
CALCIUM: 9.1 mg/dL (ref 8.7–10.2)
CHLORIDE: 104 mmol/L (ref 96–106)
CO2: 22 mmol/L (ref 18–29)
Creatinine, Ser: 0.73 mg/dL (ref 0.57–1.00)
GFR calc Af Amer: 119 mL/min/{1.73_m2} (ref >59)
GFR calc non Af Amer: 103 mL/min/{1.73_m2} (ref >59)
GLOBULIN, TOTAL: 3 g/dL (ref 1.5–4.5)
GLUCOSE: 88 mg/dL (ref 65–99)
POTASSIUM: 4.6 mmol/L (ref 3.5–5.2)
SODIUM: 139 mmol/L (ref 134–144)
TOTAL PROTEIN: 7.3 g/dL (ref 6.0–8.5)

## 2016-10-16 ENCOUNTER — Encounter: Payer: Self-pay | Admitting: Adult Health

## 2016-10-16 ENCOUNTER — Ambulatory Visit (INDEPENDENT_AMBULATORY_CARE_PROVIDER_SITE_OTHER): Payer: Medicaid Other | Admitting: Adult Health

## 2016-10-16 VITALS — BP 126/74 | HR 94 | Ht 63.0 in | Wt 134.5 lb

## 2016-10-16 DIAGNOSIS — Z3202 Encounter for pregnancy test, result negative: Secondary | ICD-10-CM | POA: Diagnosis not present

## 2016-10-16 DIAGNOSIS — R140 Abdominal distension (gaseous): Secondary | ICD-10-CM | POA: Diagnosis not present

## 2016-10-16 DIAGNOSIS — F329 Major depressive disorder, single episode, unspecified: Secondary | ICD-10-CM

## 2016-10-16 DIAGNOSIS — R102 Pelvic and perineal pain: Secondary | ICD-10-CM

## 2016-10-16 DIAGNOSIS — N938 Other specified abnormal uterine and vaginal bleeding: Secondary | ICD-10-CM

## 2016-10-16 DIAGNOSIS — F32A Depression, unspecified: Secondary | ICD-10-CM

## 2016-10-16 DIAGNOSIS — Z319 Encounter for procreative management, unspecified: Secondary | ICD-10-CM

## 2016-10-16 DIAGNOSIS — D500 Iron deficiency anemia secondary to blood loss (chronic): Secondary | ICD-10-CM | POA: Diagnosis not present

## 2016-10-16 LAB — POCT URINE PREGNANCY: PREG TEST UR: NEGATIVE

## 2016-10-16 MED ORDER — PRENATAL PLUS 27-1 MG PO TABS: 1.0000 | ORAL_TABLET | Freq: Every day | ORAL | 11 refills | Status: DC

## 2016-10-16 MED ORDER — ESCITALOPRAM OXALATE 10 MG PO TABS: 10.0000 mg | ORAL_TABLET | Freq: Every day | ORAL | 6 refills | Status: DC

## 2016-10-16 NOTE — Progress Notes (Signed)
Subjective:     Patient ID: Laurie Howell, female   DOB: 1976/02/02, 40 y.o.   MRN: [id number]  HPI Laurie Howell is a 40 year old white female in complaining of pain in stomach and stomach is swollen.She has had tubal reversal and desires a pregnancy.She has heavy period for months at a time,and may change tampon every hour, but is not bleeding now.She has had anemia and has had iron infusion in the past. She also says sex hurts at times.Has headaches often.  Review of Systems Patient denies any  hearing loss, fatigue, blurred vision, shortness of breath, chest pain, problems with bowel movements, urination.  No joint pain or mood swings.   See HPI for positives.   Objective:   Physical Exam BP 126/74 (BP Location: Left Arm, Patient Position: Sitting, Cuff Size: Normal)   Pulse 94   Ht 5\' 3"  (1.6 m)   Wt 134 lb 8 oz (61 kg)   LMP 09/26/2016 (Approximate)   BMI 23.83 kg/m UPT negative.   Skin warm and dry. Neck: mid line trachea, normal thyroid, good ROM, no lymphadenopathy noted. Lungs: clear to ausculation bilaterally. Cardiovascular: regular rate and rhythm. Abdomen is soft and non tender, no HSM noted.  Pelvic: external genitalia is normal in appearance no lesions, vagina: scant discharge without odor,urethra has no lesions or masses noted, cervix:smooth and bulbous, uterus: normal size, shape and contour,mildly tender, no masses felt, adnexa: no masses, bilateral  tenderness noted. Bladder is non tender and no masses felt.  PHQ 9 score is 11, and she is depressed, but denies andy suicidal ideations. She was depressed in past and took xanax. Will start on lexapro, and prenatal vitamins.  Will check labs today and schedule [place]. Face time 25 minutes.  Assessment:     1. Pelvic pain   2. Abdominal bloating   3. Pregnancy examination or test, negative result   4. DUB (dysfunctional uterine bleeding)   5. Anemia due to chronic blood loss   6. Depression, unspecified depression type   7.  Patient desires pregnancy       Plan:     Check CBC,CMP,TSH and ferritin and iron with TIBC Return in 1 week for GYN [place]  Meds ordered this encounter  Medications  . escitalopram (LEXAPRO) 10 MG tablet    Sig: Take 1 tablet (10 mg total) by mouth daily.    Dispense:  30 tablet    Refill:  6    Order Specific Question:   Supervising Provider    Answer:   Elonda Husky, LUTHER H [[id number]]  . prenatal vitamin w/FE, FA (PRENATAL 1 + 1) 27-1 MG TABS tablet    Sig: Take 1 tablet by mouth daily at 12 noon.    Dispense:  30 each    Refill:  11    Order Specific Question:   Supervising Provider    Answer:   Tania Ade H [[id number]]  Follow up with me in 4 weeks

## 2016-10-16 NOTE — Patient Instructions (Signed)

## 2016-10-17 LAB — IRON AND TIBC
IRON SATURATION: 41 % (ref 15–55)
IRON: 130 ug/dL (ref 27–159)
TIBC: 319 ug/dL (ref 250–450)
UIBC: 189 ug/dL (ref 131–425)

## 2016-10-17 LAB — CBC
HEMOGLOBIN: 16.4 g/dL — AB (ref 11.1–15.9)
Hematocrit: 47.3 % — ABNORMAL HIGH (ref 34.0–46.6)
MCH: 33 pg (ref 26.6–33.0)
MCHC: 34.7 g/dL (ref 31.5–35.7)
MCV: 95 fL (ref 79–97)
PLATELETS: 303 10*3/uL (ref 150–379)
RBC: 4.97 x10E6/uL (ref 3.77–5.28)
RDW: 12.8 % (ref 12.3–15.4)
WBC: 9.1 10*3/uL (ref 3.4–10.8)

## 2016-10-17 LAB — FERRITIN: Ferritin: 152 ng/mL — ABNORMAL HIGH (ref 15–150)

## 2016-10-17 LAB — COMPREHENSIVE METABOLIC PANEL
ALBUMIN: 5 g/dL (ref 3.5–5.5)
ALT: 16 IU/L (ref 0–32)
AST: 21 IU/L (ref 0–40)
Albumin/Globulin Ratio: 1.5 (ref 1.2–2.2)
Alkaline Phosphatase: 105 IU/L (ref 39–117)
BUN / CREAT RATIO: 12 (ref 9–23)
BUN: 11 mg/dL (ref 6–24)
Bilirubin Total: 0.4 mg/dL (ref 0.0–1.2)
CALCIUM: 10.6 mg/dL — AB (ref 8.7–10.2)
CO2: 23 mmol/L (ref 18–29)
Chloride: 96 mmol/L (ref 96–106)
Creatinine, Ser: 0.89 mg/dL (ref 0.57–1.00)
GFR, EST AFRICAN AMERICAN: 94 mL/min/{1.73_m2} (ref >59)
GFR, EST NON AFRICAN AMERICAN: 81 mL/min/{1.73_m2} (ref >59)
GLUCOSE: 94 mg/dL (ref 65–99)
Globulin, Total: 3.3 g/dL (ref 1.5–4.5)
Potassium: 4.7 mmol/L (ref 3.5–5.2)
Sodium: 138 mmol/L (ref 134–144)
Total Protein: 8.3 g/dL (ref 6.0–8.5)

## 2016-10-17 LAB — TSH: TSH: 0.658 u[IU]/mL (ref 0.450–4.500)

## 2016-10-23 ENCOUNTER — Ambulatory Visit (INDEPENDENT_AMBULATORY_CARE_PROVIDER_SITE_OTHER): Payer: Medicaid Other

## 2016-10-23 DIAGNOSIS — R140 Abdominal distension (gaseous): Secondary | ICD-10-CM

## 2016-10-23 DIAGNOSIS — N854 Malposition of uterus: Secondary | ICD-10-CM | POA: Diagnosis not present

## 2016-10-23 DIAGNOSIS — R102 Pelvic and perineal pain: Secondary | ICD-10-CM | POA: Diagnosis not present

## 2016-10-23 DIAGNOSIS — D251 Intramural leiomyoma of uterus: Secondary | ICD-10-CM | POA: Diagnosis not present

## 2016-10-23 DIAGNOSIS — N938 Other specified abnormal uterine and vaginal bleeding: Secondary | ICD-10-CM | POA: Diagnosis not present

## 2016-10-23 NOTE — Progress Notes (Signed)
PELVIC US TA/TV: Anteverted uterus w/a 1.1 x .8 x .8 cm intramural fibroid mid/left uterus,normal ov's bilat,EEC 4.2 mm,no free fluid,no pain during ultrasound, ov's appear mobile.

## 2016-10-30 ENCOUNTER — Other Ambulatory Visit: Payer: Self-pay | Admitting: Obstetrics & Gynecology

## 2016-11-13 ENCOUNTER — Encounter: Payer: Self-pay | Admitting: Adult Health

## 2016-11-13 ENCOUNTER — Ambulatory Visit (INDEPENDENT_AMBULATORY_CARE_PROVIDER_SITE_OTHER): Payer: Medicaid Other | Admitting: Adult Health

## 2016-11-13 VITALS — BP 110/68 | HR 82 | Ht 63.0 in | Wt 134.0 lb

## 2016-11-13 DIAGNOSIS — F32A Depression, unspecified: Secondary | ICD-10-CM

## 2016-11-13 DIAGNOSIS — F329 Major depressive disorder, single episode, unspecified: Secondary | ICD-10-CM | POA: Diagnosis not present

## 2016-11-13 DIAGNOSIS — Z319 Encounter for procreative management, unspecified: Secondary | ICD-10-CM

## 2016-11-13 NOTE — Progress Notes (Signed)
Subjective:     Patient ID: Laurie Howell, female   DOB: [birth year], 40 y.o.   MRN: [id number]  HPI Laurie Howell is a 40 year old white female, back in follow up of starting lexapro, and she says she is better. Still wants to be pregnant.  Review of Systems Not depressed on lexapro, feels better Reviewed past medical,surgical, social and family history. Reviewed medications and allergies.     Objective:   Physical Exam BP 110/68 (BP Location: Left Arm, Patient Position: Sitting, Cuff Size: Normal)   Pulse 82   Ht 5\' 3"  (1.6 m)   Wt 134 lb (60.8 kg)   LMP 11/04/2016 (Approximate)   BMI 23.74 kg/m  Skin warm and dry. Lungs: clear to ausculation bilaterally. Cardiovascular: regular rate and rhythm.   PHQ 2 score 1, which is down from 11 in October, and she says she feels better.She is aware labs are good and [place] was normal, has small fibroid. Will check progesterone level day 21 of cycle to see if ovulating, if not refer to Dr Elonda Husky, because of age.  Assessment:     1. Depression, unspecified depression type   2. Patient desires pregnancy       Plan:     Continue lexapro   Get progesterone level 11/27  Will talk when results back

## 2016-11-13 NOTE — Patient Instructions (Addendum)
Get progesterone level 11/27  Will talk when results back  Continue lexapro

## 2016-11-26 LAB — PROGESTERONE: Progesterone: 0.5 ng/mL

## 2016-12-02 ENCOUNTER — Telehealth: Payer: Self-pay | Admitting: Adult Health

## 2016-12-02 NOTE — Telephone Encounter (Signed)
Left message x 1. JSY 

## 2016-12-03 NOTE — Telephone Encounter (Signed)
Spoke with pt letting her know her progesterone was 0.5 and she didn't ovulate. Pt voiced understanding. Laurie Howell

## 2016-12-11 ENCOUNTER — Ambulatory Visit (INDEPENDENT_AMBULATORY_CARE_PROVIDER_SITE_OTHER): Payer: Medicaid Other | Admitting: Adult Health

## 2016-12-11 ENCOUNTER — Encounter: Payer: Self-pay | Admitting: Adult Health

## 2016-12-11 VITALS — BP 92/70 | HR 80 | Ht 63.0 in | Wt 134.5 lb

## 2016-12-11 DIAGNOSIS — R5382 Chronic fatigue, unspecified: Secondary | ICD-10-CM | POA: Diagnosis not present

## 2016-12-11 DIAGNOSIS — F329 Major depressive disorder, single episode, unspecified: Secondary | ICD-10-CM | POA: Diagnosis not present

## 2016-12-11 DIAGNOSIS — N926 Irregular menstruation, unspecified: Secondary | ICD-10-CM | POA: Diagnosis not present

## 2016-12-11 DIAGNOSIS — N644 Mastodynia: Secondary | ICD-10-CM

## 2016-12-11 DIAGNOSIS — F32A Depression, unspecified: Secondary | ICD-10-CM

## 2016-12-11 DIAGNOSIS — Z3202 Encounter for pregnancy test, result negative: Secondary | ICD-10-CM | POA: Diagnosis not present

## 2016-12-11 DIAGNOSIS — Z319 Encounter for procreative management, unspecified: Secondary | ICD-10-CM

## 2016-12-11 LAB — POCT URINE PREGNANCY: Preg Test, Ur: NEGATIVE

## 2016-12-11 MED ORDER — ESCITALOPRAM OXALATE 10 MG PO TABS: 10.0000 mg | ORAL_TABLET | Freq: Every day | ORAL | 6 refills | Status: DC

## 2016-12-11 NOTE — Progress Notes (Signed)
Subjective:     Patient ID: Laurie Howell, female   DOB: 09-10-1976, 40 y.o.   MRN: [id number]  HPI Laurie Howell is a 40 year old white female in for UPT, has missed a period and has been eating more, feels tired and has breast tenderness, she was seen in ER at [facility] and had negative Pregnancy test there.She stopped lexapro, due to thinking she was pregnant.She wants to be pregnant, had tubal reversal.   Review of Systems  Missed period Eating more Breast tenderness Tired Reviewed past medical,surgical, social and family history. Reviewed medications and allergies.     Objective:   Physical Exam BP 92/70 (BP Location: Left Arm, Patient Position: Sitting, Cuff Size: Normal)   Pulse 80   Ht 5\' 3"  (1.6 m)   Wt 134 lb 8 oz (61 kg)   LMP 11/04/2016 (Approximate)   BMI 23.83 kg/m UPT negative, Skin warm and dry.  Lungs: clear to ausculation bilaterally. Cardiovascular: regular rate and rhythm.   PHQ 9 score 12, restart lexapro.  Assessment:     1. Missed period   2. Depression, unspecified depression type   3. Pregnancy examination or test, negative result   4. Patient desires pregnancy       Plan:    Return in 3 weeks to discuss HSG with Dr Glo Herring Meds ordered this encounter  Medications  . escitalopram (LEXAPRO) 10 MG tablet    Sig: Take 1 tablet (10 mg total) by mouth daily.    Dispense:  30 tablet    Refill:  6    Order Specific Question:   Supervising Provider    Answer:   Tania Ade H [[id number]]

## 2017-01-02 ENCOUNTER — Encounter: Payer: Self-pay | Admitting: Obstetrics and Gynecology

## 2017-01-02 ENCOUNTER — Ambulatory Visit (INDEPENDENT_AMBULATORY_CARE_PROVIDER_SITE_OTHER): Payer: Medicaid Other | Admitting: Obstetrics and Gynecology

## 2017-01-02 VITALS — BP 118/75 | HR 101 | Wt 128.2 lb

## 2017-01-02 DIAGNOSIS — N970 Female infertility associated with anovulation: Secondary | ICD-10-CM

## 2017-01-02 DIAGNOSIS — F329 Major depressive disorder, single episode, unspecified: Secondary | ICD-10-CM

## 2017-01-02 DIAGNOSIS — N926 Irregular menstruation, unspecified: Secondary | ICD-10-CM

## 2017-01-02 NOTE — Progress Notes (Signed)
   [facility] Visit  _0 @            Patient name: Laurie Howell MRN [id number]  Date of birth: 1976/11/17  CC & HPI:  Laurie Howell is a 41 y.o. female presenting today for Questions of ovulation. She is status post tubal reversal 2016 at [facility] for reproductive medicine, [place], and has now not had any testing for tubal patency. The surgery was removal of fallopian rings which has a high reversal success rate  ROS:  ROS   Pertinent History Reviewed:   Reviewed: Significant for tubal reversal, had one test after reversalSince she was not ovulating but has taken ovulation guide testing at the [facility] and those have indicated that she was ovulating Cycles are every 4 weeks with seven-day menses Married 7 years, and same stable relationship 25 years the current partner, her husband is the father of HER-2 prior children Patient's last menstrual period was 12/27/2016.  Medical         Past Medical History:  Diagnosis Date  . Anemia   . Esophageal stricture   . Frequent UTI   . GERD (gastroesophageal reflux disease)   . Headache   . Iron deficiency anemia 03/28/2016                              Surgical Hx:    Past Surgical History:  Procedure Laterality Date  . ESOPHAGOGASTRODUODENOSCOPY N/A 06/27/2016   Procedure: ESOPHAGOGASTRODUODENOSCOPY (EGD);  Surgeon: Danie Binder, MD;  [facility];  Service: Endoscopy;  Laterality: N/A;  1245  . SAVORY DILATION N/A 06/27/2016   Procedure: SAVORY DILATION;  Surgeon: Danie Binder, MD;  [facility];  Service: Endoscopy;  Laterality: N/A;  . TUBAL LIGATION  1997  . Tubal reversal  02/2015   NCCM - Dr Karie Kirks   Medications: Reviewed & Updated - see associated section                       Current Outpatient Prescriptions:  .  escitalopram (LEXAPRO) 10 MG tablet, Take 1 tablet (10 mg total) by mouth daily., Disp: 30 tablet, Rfl: 6 .  pantoprazole (PROTONIX) 40 MG tablet, 1 PO 30 MINS PRIOR TO  BREAKFAST, Disp: 90 tablet, Rfl: 3 .  prenatal vitamin w/FE, FA (PRENATAL 1 + 1) 27-1 MG TABS tablet, Take 1 tablet by mouth daily at 12 noon., Disp: 30 each, Rfl: 11   Social History: Reviewed -  reports that she quit smoking about 22 years ago. Her smoking use included Cigarettes. She quit after 1.00 year of use. She has never used smokeless tobacco.  Objective Findings:  Vitals: Blood pressure 118/75, pulse (!) 101, weight 128 lb 3.2 oz (58.2 kg), last menstrual period 12/27/2016.  Physical Examination: General appearance - alert, well appearing, and in no distress and normal appearing weight Mental status - alert, oriented to person, place, and time   Assessment & Plan:   A:  1. Desires fertility, s/p tubal reversal(clips) 2016 at [facility].  P:  1. Serum progesterone tsh 19 Jan  2 consider HSG after next menses.

## 2017-01-17 ENCOUNTER — Other Ambulatory Visit: Payer: Medicaid Other

## 2017-01-17 DIAGNOSIS — N970 Female infertility associated with anovulation: Secondary | ICD-10-CM

## 2017-01-18 LAB — PROGESTERONE: PROGESTERONE: 0.1 ng/mL

## 2017-01-18 LAB — TSH: TSH: 0.009 u[IU]/mL — AB (ref 0.450–4.500)

## 2017-01-23 ENCOUNTER — Ambulatory Visit (INDEPENDENT_AMBULATORY_CARE_PROVIDER_SITE_OTHER): Payer: Medicaid Other | Admitting: Obstetrics and Gynecology

## 2017-01-23 ENCOUNTER — Encounter: Payer: Self-pay | Admitting: Obstetrics and Gynecology

## 2017-01-23 VITALS — BP 120/82 | HR 86 | Ht 63.0 in | Wt 125.8 lb

## 2017-01-23 DIAGNOSIS — N970 Female infertility associated with anovulation: Secondary | ICD-10-CM

## 2017-01-23 DIAGNOSIS — E0590 Thyrotoxicosis, unspecified without thyrotoxic crisis or storm: Secondary | ICD-10-CM

## 2017-01-23 HISTORY — DX: Female infertility associated with anovulation: N97.0

## 2017-01-23 MED ORDER — CLOMIPHENE CITRATE 50 MG PO TABS: 50.0000 mg | ORAL_TABLET | Freq: Every day | ORAL | 0 refills | Status: AC

## 2017-01-23 NOTE — Progress Notes (Signed)
[facility] Visit  01/23/17            Patient name: Laurie Howell MRN [id number]  Date of birth: [birth year]  CC & HPI:  Laurie Howell is a 41 y.o. female presenting today for follow up for TSH and progesterone levels and anovulation. Pt notes that she has been trying to get pregnant x 1 year with her husband. Patient's last menstrual period was 12/16/2016 and her menstrual cycles are typically regular. Pt hasn't tried any medications for the relief of her symptoms. Pt denies any other symptoms.   ROS:  ROS  +anovulation  Pertinent History Reviewed:   Reviewed: Significant for anemia Medical         Past Medical History:  Diagnosis Date  . Anemia   . Esophageal stricture   . Frequent UTI   . GERD (gastroesophageal reflux disease)   . Headache   . Iron deficiency anemia 03/28/2016                              Surgical Hx:    Past Surgical History:  Procedure Laterality Date  . ESOPHAGOGASTRODUODENOSCOPY N/A 06/27/2016   Procedure: ESOPHAGOGASTRODUODENOSCOPY (EGD);  Surgeon: Danie Binder, MD;  [facility];  Service: Endoscopy;  Laterality: N/A;  1245  . SAVORY DILATION N/A 06/27/2016   Procedure: SAVORY DILATION;  Surgeon: Danie Binder, MD;  [facility];  Service: Endoscopy;  Laterality: N/A;  . TUBAL LIGATION  1997  . Tubal reversal  02/2015   NCCM - Dr Karie Kirks   Medications: Reviewed & Updated - see associated section                       Current Outpatient Prescriptions:  .  escitalopram (LEXAPRO) 10 MG tablet, Take 1 tablet (10 mg total) by mouth daily., Disp: 30 tablet, Rfl: 6 .  pantoprazole (PROTONIX) 40 MG tablet, 1 PO 30 MINS PRIOR TO BREAKFAST, Disp: 90 tablet, Rfl: 3 .  prenatal vitamin w/FE, FA (PRENATAL 1 + 1) 27-1 MG TABS tablet, Take 1 tablet by mouth daily at 12 noon., Disp: 30 each, Rfl: 11   Social History: Reviewed -  reports that she quit smoking about 22 years ago. Her smoking use included Cigarettes. She quit after  1.00 year of use. She has never used smokeless tobacco.  Objective Findings:  Vitals: Blood pressure 120/82, pulse 86, height 5\' 3"  (1.6 m), weight 125 lb 12.8 oz (57.1 kg), last menstrual period 12/16/2016.  Physical Examination: Pt with a palpated pulse rate of 84  Discussion only  Discussion: 1. Discussed with pt risks and benefits of anovulation and ?hyperthyroidism  At end of discussion, pt had opportunity to ask questions and has no further questions at this time.   Specific discussion of anovulation and ?hyperthyroidism as noted above. Greater than 50% was spent in counseling and coordination of care with the patient.   Total time greater than: 15 minutes.     Assessment & Plan:   A:  1. Anovulation 2. ?Hyperthyroidism  P:  1. Rx 50 mg Clomid x 5 days 2.  Collect TSH and progesterone levels on 02/05/17 3. Use fertility websites 4. Follow up in 1 month    By signing my name below, I, Soijett Blue, attest that this documentation has been prepared under the direction and in the presence of Jonnie Kind, MD. Electronically  Signed: Soijett Blue, ED Scribe. 01/23/17. 2:44 PM.  I personally performed the services described in this documentation, which was SCRIBED in my presence. The recorded information has been reviewed and considered accurate. It has been edited as necessary during review. Jonnie Kind, MD

## 2017-02-06 LAB — THYROID PANEL WITH TSH
FREE THYROXINE INDEX: 1.9 (ref 1.2–4.9)
T3 Uptake Ratio: 27 % (ref 24–39)
T4 TOTAL: 7.2 ug/dL (ref 4.5–12.0)
TSH: 0.031 u[IU]/mL — AB (ref 0.450–4.500)

## 2017-02-06 LAB — PROGESTERONE: Progesterone: 2 ng/mL

## 2017-02-12 ENCOUNTER — Encounter: Payer: Self-pay | Admitting: Obstetrics and Gynecology

## 2017-02-12 ENCOUNTER — Ambulatory Visit (INDEPENDENT_AMBULATORY_CARE_PROVIDER_SITE_OTHER): Payer: Medicaid Other | Admitting: Obstetrics and Gynecology

## 2017-02-12 VITALS — BP 102/64 | HR 76 | Wt 124.4 lb

## 2017-02-12 DIAGNOSIS — N970 Female infertility associated with anovulation: Secondary | ICD-10-CM

## 2017-02-12 DIAGNOSIS — N971 Female infertility of tubal origin: Secondary | ICD-10-CM

## 2017-02-12 NOTE — Progress Notes (Signed)
   [facility] Visit  02/12/2017          Patient name: Laurie Howell MRN [id number]  Date of birth: [birth year]  CC & HPI:  Laurie Howell is a 41 y.o. female presenting today for recheck of progesterone levels. Pt was last seen in office on 01/23/2017 for anovulation. She returns today for continued follow up. She believes she was ovulating from 02/03/17-02/06/17.  Pt has no acute complaints or symptoms at this time.   ROS:  ROS Otherwise negative for acute change except as noted in the HPI.  Pertinent History Reviewed:   Reviewed: Significant for Tubal Ligation and Ligation reversal  Medical         Past Medical History:  Diagnosis Date  . Anemia   . Esophageal stricture   . Frequent UTI   . GERD (gastroesophageal reflux disease)   . Headache   . Iron deficiency anemia 03/28/2016                              Surgical Hx:    Past Surgical History:  Procedure Laterality Date  . ESOPHAGOGASTRODUODENOSCOPY N/A 06/27/2016   Procedure: ESOPHAGOGASTRODUODENOSCOPY (EGD);  Surgeon: Danie Binder, MD;  [facility];  Service: Endoscopy;  Laterality: N/A;  1245  . SAVORY DILATION N/A 06/27/2016   Procedure: SAVORY DILATION;  Surgeon: Danie Binder, MD;  [facility];  Service: Endoscopy;  Laterality: N/A;  . TUBAL LIGATION  1997  . Tubal reversal  02/2015   NCCM - Dr Karie Kirks   Medications: Reviewed & Updated - see associated section                       Current Outpatient Prescriptions:  .  escitalopram (LEXAPRO) 10 MG tablet, Take 1 tablet (10 mg total) by mouth daily., Disp: 30 tablet, Rfl: 6 .  pantoprazole (PROTONIX) 40 MG tablet, 1 PO 30 MINS PRIOR TO BREAKFAST, Disp: 90 tablet, Rfl: 3 .  prenatal vitamin w/FE, FA (PRENATAL 1 + 1) 27-1 MG TABS tablet, Take 1 tablet by mouth daily at 12 noon., Disp: 30 each, Rfl: 11   Social History: Reviewed -  reports that she quit smoking about 22 years ago. Her smoking use included Cigarettes. She quit after 1.00  year of use. She has never used smokeless tobacco.  Objective Findings:  Vitals: Blood pressure 102/64, pulse 76, weight 124 lb 6.4 oz (56.4 kg), last menstrual period 01/10/2017.  Physical Examination: General appearance - alert, well appearing, and in no distress Mental status - alert, oriented to person, place, and time   Assessment & Plan:   A:  1. Anovulation 2. H/o Tubal reversal   P: Check progesterone level, as pt felt she ovulated 1 wk ago 1. Follow up in 2 weeks. For HCG serum if amenorrheic, if pt has menses, would advise HSG to check tubal patency 'addendum; progesterone excellent at 34.   By signing my name below, I, [name], attest that this documentation has been prepared under the direction and in the presence of Jonnie Kind, MD . Electronically Signed: [name], Scribe. 02/12/2017. 2:35 PM. I personally performed the services described in this documentation, which was SCRIBED in my presence. The recorded information has been reviewed and considered accurate. It has been edited as necessary during review. Jonnie Kind, MD

## 2017-02-13 LAB — PROGESTERONE: Progesterone: 34.7 ng/mL

## 2017-02-17 ENCOUNTER — Telehealth: Payer: Self-pay | Admitting: *Deleted

## 2017-02-17 NOTE — Telephone Encounter (Signed)
Informed patient of ovulation and request to call us at the start of her next menses so her procedure can be scheduled. Pt verbalized understanding.

## 2017-02-21 ENCOUNTER — Telehealth: Payer: Self-pay | Admitting: Obstetrics and Gynecology

## 2017-02-24 ENCOUNTER — Telehealth: Payer: Self-pay | Admitting: Obstetrics and Gynecology

## 2017-02-24 NOTE — Telephone Encounter (Signed)
Pt called stating that she would like to speak with Dr. Johnnye Sima nurse. Pt did not state the reason why. Please contact pt

## 2017-02-24 NOTE — Telephone Encounter (Signed)
Pt states she started her period last Friday 2/23 and she was supposed to call Dr. Glo Herring when she started so she could schedule a HSG, she called but no one ever got back to her.  Informed pt thad Dr. Glo Herring is out of the office until Wednesday and she states she has an appointment scheduled with him already for Wednesday.

## 2017-02-25 ENCOUNTER — Other Ambulatory Visit: Payer: Self-pay | Admitting: Obstetrics and Gynecology

## 2017-02-25 DIAGNOSIS — Z319 Encounter for procreative management, unspecified: Secondary | ICD-10-CM

## 2017-02-25 NOTE — Telephone Encounter (Signed)
Radiology department is able to schedule HSG for Thursday 3/1 at 9:45 am. Message left for pt to call our office tomorrow and confirm her availability.

## 2017-02-26 ENCOUNTER — Encounter: Payer: Self-pay | Admitting: Obstetrics and Gynecology

## 2017-02-26 ENCOUNTER — Encounter: Payer: Medicaid Other | Admitting: *Deleted

## 2017-02-26 ENCOUNTER — Ambulatory Visit (INDEPENDENT_AMBULATORY_CARE_PROVIDER_SITE_OTHER): Payer: Medicaid Other | Admitting: Obstetrics and Gynecology

## 2017-02-26 VITALS — BP 100/52 | HR 79 | Wt 125.0 lb

## 2017-02-26 DIAGNOSIS — N971 Female infertility of tubal origin: Secondary | ICD-10-CM

## 2017-02-26 NOTE — Progress Notes (Signed)
   [facility] Visit  02/26/2017          Patient name: Laurie Howell MRN [id number]  Date of birth: 1976/11/10  CC & HPI:  Laurie Howell is a 41 y.o. female presenting today for follow concerning fertility issues. Pt has no other acute complaints or symptoms at this time. Menstrual period began 02/21/2017.  Pt wants to consider HSG after tax $$ received.  ROS:  ROS Otherwise negative for acute change except as noted in the HPI.  Pertinent History Reviewed:   Reviewed: Significant for tubal ligation and ligation reversal Medical         Past Medical History:  Diagnosis Date  . Anemia   . Esophageal stricture   . Frequent UTI   . GERD (gastroesophageal reflux disease)   . Headache   . Iron deficiency anemia 03/28/2016                              Surgical Hx:    Past Surgical History:  Procedure Laterality Date  . ESOPHAGOGASTRODUODENOSCOPY N/A 06/27/2016   Procedure: ESOPHAGOGASTRODUODENOSCOPY (EGD);  Surgeon: Danie Binder, MD;  [facility];  Service: Endoscopy;  Laterality: N/A;  1245  . SAVORY DILATION N/A 06/27/2016   Procedure: SAVORY DILATION;  Surgeon: Danie Binder, MD;  [facility];  Service: Endoscopy;  Laterality: N/A;  . TUBAL LIGATION  1997  . Tubal reversal  02/2015   NCCM - Dr Karie Kirks   Medications: Reviewed & Updated - see associated section                       Current Outpatient Prescriptions:  .  escitalopram (LEXAPRO) 10 MG tablet, Take 1 tablet (10 mg total) by mouth daily., Disp: 30 tablet, Rfl: 6 .  pantoprazole (PROTONIX) 40 MG tablet, 1 PO 30 MINS PRIOR TO BREAKFAST, Disp: 90 tablet, Rfl: 3 .  prenatal vitamin w/FE, FA (PRENATAL 1 + 1) 27-1 MG TABS tablet, Take 1 tablet by mouth daily at 12 noon., Disp: 30 each, Rfl: 11   Social History: Reviewed -  reports that she quit smoking about 22 years ago. Her smoking use included Cigarettes. She quit after 1.00 year of use. She has never used smokeless tobacco.  Objective  Findings:  Vitals: Blood pressure (!) 100/52, pulse 79, weight 125 lb (56.7 kg), last menstrual period 02/21/2017.  Physical Examination: General appearance - alert, well appearing, and in no distress Mental status - alert, oriented to person, place, and time Pelvic - examination not indicated  The provider spent over 15 minutes with the visit, including pre visit review, documentation, with >than 50% spent in counseling and coordination of care. Last months labs reviewed. Ovulation documented  Assessment & Plan:   A:  1. Infertility  2  Ovulation documented last month. P:  1. Will follow up for HSG testing , ?next week.    By signing my name below, I, [name], attest that this documentation has been prepared under the direction and in the presence of Jonnie Kind, MD . Electronically Signed: [name], Scribe. 02/26/2017. 2:19 PM. I personally performed the services described in this documentation, which was SCRIBED in my presence. The recorded information has been reviewed and considered accurate. It has been edited as necessary during review. Jonnie Kind, MD

## 2017-02-27 ENCOUNTER — Other Ambulatory Visit (HOSPITAL_COMMUNITY): Payer: Medicaid Other

## 2017-03-10 ENCOUNTER — Encounter: Payer: Medicaid Other | Admitting: *Deleted

## 2017-04-21 ENCOUNTER — Ambulatory Visit (INDEPENDENT_AMBULATORY_CARE_PROVIDER_SITE_OTHER): Payer: Medicaid Other | Admitting: Family Medicine

## 2017-04-21 VITALS — BP 103/63 | HR 81 | Temp 98.2°F | Ht 63.0 in | Wt 131.0 lb

## 2017-04-21 DIAGNOSIS — R42 Dizziness and giddiness: Secondary | ICD-10-CM | POA: Diagnosis not present

## 2017-04-21 DIAGNOSIS — R55 Syncope and collapse: Secondary | ICD-10-CM

## 2017-04-21 DIAGNOSIS — E611 Iron deficiency: Secondary | ICD-10-CM | POA: Diagnosis not present

## 2017-04-21 NOTE — Progress Notes (Signed)
Subjective:  Patient ID: Laurie Howell, female    DOB: Apr 19, 1976  Age: 41 y.o. MRN: [id number]  CC: Dizziness (pt here today c/o left eye twitching, left arm goes numb occasional, and lightheaded and dizziness.)   HPI Laurie Howell presents for Onset today of numbness all over with a feeling of lightheadedness like she might pass out. The patient denies having had passed out. She says that a few days ago she had similar symptoms she laid down on her bed and the next thing she knew it was an hour later. She does not feel like she fell asleep. There is no postictal syndrome including feeling of exhaustion muscle aches confusion or incontinence. Spells have not been observed. Today the dizziness has been present all day. She feels like she is in a fog or dazed. There is a twitch in the left infraorbital cheek. This is intermittent and recurrent. The left arm is not any more numb than any other area. However on previous occasion she notes that just the whole left side of her body has been numb. Today's the first time the entire body has felt numb.   History Laurie Howell has a past medical history of Anemia; Esophageal stricture; Frequent UTI; GERD (gastroesophageal reflux disease); Headache; and Iron deficiency anemia (03/28/2016).   She has a past surgical history that includes Tubal ligation (1997); Tubal reversal (02/2015); Esophagogastroduodenoscopy (N/A, 06/27/2016); and Savory dilation (N/A, 06/27/2016).   Her family history includes Anxiety disorder in her father; Depression in her mother; Heart attack in her father; Heart disease in her father; Hypertension in her father and mother; Other in her mother; Stroke in her maternal grandmother.She reports that she quit smoking about 22 years ago. Her smoking use included Cigarettes. She quit after 1.00 year of use. She has never used smokeless tobacco. She reports that she does not drink alcohol or use drugs.    ROS Review of Systems  Constitutional:  Negative for activity change, appetite change and fever.  HENT: Negative for congestion, rhinorrhea and sore throat.   Eyes: Negative for visual disturbance.  Respiratory: Negative for cough and shortness of breath.   Cardiovascular: Negative for chest pain and palpitations.  Gastrointestinal: Negative for abdominal pain, diarrhea and nausea.  Genitourinary: Negative for dysuria.  Musculoskeletal: Negative for arthralgias and myalgias.  Neurological: Positive for syncope, weakness, light-headedness and numbness. Negative for facial asymmetry and speech difficulty.    Objective:  BP 103/63   Pulse 81   Temp 98.2 F (36.8 C) (Oral)   Ht '5\' 3"'  (1.6 m)   Wt 131 lb (59.4 kg)   BMI 23.21 kg/m   BP Readings from Last 3 Encounters:  04/21/17 103/63  02/26/17 (!) 100/52  02/12/17 102/64    Wt Readings from Last 3 Encounters:  04/21/17 131 lb (59.4 kg)  02/26/17 125 lb (56.7 kg)  02/12/17 124 lb 6.4 oz (56.4 kg)     Physical Exam  Constitutional: She is oriented to person, place, and time. She appears well-developed and well-nourished. No distress.  HENT:  Head: Normocephalic and atraumatic.  Right Ear: External ear normal.  Left Ear: External ear normal.  Nose: Nose normal.  Mouth/Throat: Oropharynx is clear and moist.  Eyes: Conjunctivae and EOM are normal. Pupils are equal, round, and reactive to light.  Neck: Normal range of motion. Neck supple. No thyromegaly present.  Cardiovascular: Normal rate, regular rhythm and normal heart sounds.   No murmur heard. Pulmonary/Chest: Effort normal and breath sounds normal. No  respiratory distress. She has no wheezes. She has no rales.  Abdominal: Soft. Bowel sounds are normal. She exhibits no distension. There is no tenderness.  Lymphadenopathy:    She has no cervical adenopathy.  Neurological: She is alert and oriented to person, place, and time. She has normal reflexes.  Skin: Skin is warm and dry.  Psychiatric: She has a normal  mood and affect. Her behavior is normal. Judgment and thought content normal.      Assessment & Plan:   Laurie Howell was seen today for dizziness.  Diagnoses and all orders for this visit:  Iron deficiency -     Anemia Profile B -     CMP14+EGFR  Dizziness  Syncope, unspecified syncope type   Patient may be in need of an EEG as this dizziness and syncope may well represent a partial/complex seizure. We will order if her iron deficiency tests come back normal.    I have discontinued Ms. Beeney's pantoprazole. I am also having her maintain her (prenatal vitamin w/FE, FA) and escitalopram.  Allergies as of 04/21/2017      Reactions   Aspirin Swelling   Ivp Dye [iodinated Diagnostic Agents]       Medication List       Accurate as of 04/21/17  7:01 PM. Always use your most recent med list.          escitalopram 10 MG tablet Commonly known as:  LEXAPRO Take 1 tablet (10 mg total) by mouth daily.   prenatal vitamin w/FE, FA 27-1 MG Tabs tablet Take 1 tablet by mouth daily at 12 noon.        Follow-up: Return in about 2 weeks (around 05/05/2017) for Dizziness with Ms. Hawks.  Claretta Fraise, M.D.

## 2017-04-22 LAB — ANEMIA PROFILE B
Basophils Absolute: 0 10*3/uL (ref 0.0–0.2)
Basos: 0 %
EOS (ABSOLUTE): 0.2 10*3/uL (ref 0.0–0.4)
EOS: 2 %
FERRITIN: 89 ng/mL (ref 15–150)
HEMOGLOBIN: 13.8 g/dL (ref 11.1–15.9)
Hematocrit: 40.9 % (ref 34.0–46.6)
IMMATURE GRANULOCYTES: 0 %
IRON SATURATION: 27 % (ref 15–55)
Immature Grans (Abs): 0 10*3/uL (ref 0.0–0.1)
Iron: 77 ug/dL (ref 27–159)
LYMPHS ABS: 2 10*3/uL (ref 0.7–3.1)
Lymphs: 28 %
MCH: 32.3 pg (ref 26.6–33.0)
MCHC: 33.7 g/dL (ref 31.5–35.7)
MCV: 96 fL (ref 79–97)
MONOS ABS: 0.5 10*3/uL (ref 0.1–0.9)
Monocytes: 7 %
NEUTROS ABS: 4.6 10*3/uL (ref 1.4–7.0)
NEUTROS PCT: 63 %
Platelets: 281 10*3/uL (ref 150–379)
RBC: 4.27 x10E6/uL (ref 3.77–5.28)
RDW: 12.6 % (ref 12.3–15.4)
Retic Ct Pct: 1 % (ref 0.6–2.6)
Total Iron Binding Capacity: 288 ug/dL (ref 250–450)
UIBC: 211 ug/dL (ref 131–425)
Vitamin B-12: 719 pg/mL (ref 232–1245)
WBC: 7.2 10*3/uL (ref 3.4–10.8)

## 2017-04-22 LAB — CMP14+EGFR
ALBUMIN: 4.5 g/dL (ref 3.5–5.5)
ALK PHOS: 74 IU/L (ref 39–117)
ALT: 14 IU/L (ref 0–32)
AST: 18 IU/L (ref 0–40)
Albumin/Globulin Ratio: 1.6 (ref 1.2–2.2)
BILIRUBIN TOTAL: 0.2 mg/dL (ref 0.0–1.2)
BUN / CREAT RATIO: 22 (ref 9–23)
BUN: 13 mg/dL (ref 6–24)
CO2: 22 mmol/L (ref 18–29)
Calcium: 10 mg/dL (ref 8.7–10.2)
Chloride: 105 mmol/L (ref 96–106)
Creatinine, Ser: 0.59 mg/dL (ref 0.57–1.00)
GFR calc Af Amer: 133 mL/min/{1.73_m2} (ref >59)
GFR calc non Af Amer: 115 mL/min/{1.73_m2} (ref >59)
GLOBULIN, TOTAL: 2.9 g/dL (ref 1.5–4.5)
Glucose: 96 mg/dL (ref 65–99)
POTASSIUM: 4.3 mmol/L (ref 3.5–5.2)
SODIUM: 141 mmol/L (ref 134–144)
Total Protein: 7.4 g/dL (ref 6.0–8.5)

## 2017-04-27 ENCOUNTER — Encounter (HOSPITAL_COMMUNITY): Payer: Self-pay | Admitting: Emergency Medicine

## 2017-04-27 ENCOUNTER — Emergency Department (HOSPITAL_COMMUNITY)
Admission: EM | Admit: 2017-04-27 | Discharge: 2017-04-27 | Disposition: A | Discharge status: Home / Self Care | Payer: Medicaid Other | Attending: Emergency Medicine | Admitting: Emergency Medicine

## 2017-04-27 DIAGNOSIS — N939 Abnormal uterine and vaginal bleeding, unspecified: Secondary | ICD-10-CM | POA: Diagnosis present

## 2017-04-27 DIAGNOSIS — F17210 Nicotine dependence, cigarettes, uncomplicated: Secondary | ICD-10-CM | POA: Insufficient documentation

## 2017-04-27 LAB — CBC WITH DIFFERENTIAL/PLATELET
Basophils Absolute: 0.1 10*3/uL (ref 0.0–0.1)
Basophils Relative: 1 %
Eosinophils Absolute: 0.1 10*3/uL (ref 0.0–0.7)
Eosinophils Relative: 2 %
HCT: 44.5 % (ref 36.0–46.0)
Hemoglobin: 15.3 g/dL — ABNORMAL HIGH (ref 12.0–15.0)
Lymphocytes Relative: 28 %
Lymphs Abs: 2.4 10*3/uL (ref 0.7–4.0)
MCH: 32.4 pg (ref 26.0–34.0)
MCHC: 34.4 g/dL (ref 30.0–36.0)
MCV: 94.3 fL (ref 78.0–100.0)
Monocytes Absolute: 0.6 10*3/uL (ref 0.1–1.0)
Monocytes Relative: 7 %
Neutro Abs: 5.4 10*3/uL (ref 1.7–7.7)
Neutrophils Relative %: 62 %
Platelets: 287 10*3/uL (ref 150–400)
RBC: 4.72 MIL/uL (ref 3.87–5.11)
RDW: 11.8 % (ref 11.5–15.5)
WBC: 8.5 10*3/uL (ref 4.0–10.5)

## 2017-04-27 LAB — I-STAT BETA HCG BLOOD, ED (MC, WL, AP ONLY): I-stat hCG, quantitative: <5 m[IU]/mL (ref <5)

## 2017-04-27 MED ORDER — IBUPROFEN 400 MG PO TABS
600.0000 mg | ORAL_TABLET | Freq: Once | ORAL | Status: AC
  Administered 2017-04-27: 600 mg via ORAL
  Filled 2017-04-27: qty 2

## 2017-04-27 MED ORDER — OXYCODONE-ACETAMINOPHEN 5-325 MG PO TABS
1.0000 | ORAL_TABLET | Freq: Once | ORAL | Status: AC
  Administered 2017-04-27: 1 via ORAL
  Filled 2017-04-27: qty 1

## 2017-04-27 MED ORDER — MEDROXYPROGESTERONE ACETATE 10 MG PO TABS: 10.0000 mg | ORAL_TABLET | Freq: Every day | ORAL | 0 refills | Status: DC

## 2017-04-27 NOTE — ED Triage Notes (Signed)
Bleeding heavy for past 2days.  States she is going through 7-8 tampons per hour for past 2 days.  Lower abd pain x 2days.  Denies urinary s/s

## 2017-04-27 NOTE — ED Provider Notes (Signed)
[facility] DEPT Provider Note   CSN: [id number] Arrival date & time: 04/27/17  1957   By signing my name below, I, Hilbert Odor, attest that this documentation has been prepared under the direction and in the presence of Virgel Manifold, MD. Electronically Signed: Hilbert Odor, Scribe. 04/27/17. 9:26 PM. History   Chief Complaint Chief Complaint  Patient presents with  . Vaginal Bleeding    The history is provided by the patient. No language interpreter was used.  HPI Comments: Laurie Howell is a 41 y.o. female who presents to the Emergency Department complaining of stabbing lower abdominal pain with heavy bleeding for the past 2 days. The patient states that she has went through 7-8 tampons per hour for the past 2 days. She states "the other night a piece of meat came out of my vagina". She states that she has been having trouble with her periods for the past month. She has talked to her gynecologist about this problem. An ultrasound was performed but no abnormalities were found. She has had difficulty in the past with heavy bleeding. She has her tubes tied. She denies hx of fibroids. She denies any other urinary symptoms.  Past Medical History:  Diagnosis Date  . Anemia   . Esophageal stricture   . Frequent UTI   . GERD (gastroesophageal reflux disease)   . Headache   . Iron deficiency anemia 03/28/2016    Patient Active Problem List   Diagnosis Date Noted  . Anovulation 01/23/2017  . Hyperthyroidism 01/23/2017  . Gastroesophageal reflux disease with esophagitis   . Dysphagia 06/20/2016  . Iron deficiency anemia 03/28/2016  . 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 Intractable migraine without aura 01/14/2014  . Daily headache 01/14/2014    Past Surgical History:  Procedure Laterality Date  . ESOPHAGOGASTRODUODENOSCOPY N/A 06/27/2016   Procedure: ESOPHAGOGASTRODUODENOSCOPY (EGD);  Surgeon: Danie Binder, MD;  Location: [facility];  Service: Endoscopy;   Laterality: N/A;  1245  . SAVORY DILATION N/A 06/27/2016   Procedure: SAVORY DILATION;  Surgeon: Danie Binder, MD;  Location: [facility];  Service: Endoscopy;  Laterality: N/A;  . TUBAL LIGATION  1997  . Tubal reversal  02/2015   NCCM - Dr Karie Kirks    OB History    Gravida Para Term Preterm AB Living   6 2 2   4 2    SAB TAB Ectopic Multiple Live Births   4       2       Home Medications    Prior to Admission medications   Medication Sig Start Date End Date Taking? Authorizing Provider  escitalopram (LEXAPRO) 10 MG tablet Take 1 tablet (10 mg total) by mouth daily. 12/11/16   Estill Dooms, NP  prenatal vitamin w/FE, FA (PRENATAL 1 + 1) 27-1 MG TABS tablet Take 1 tablet by mouth daily at 12 noon. 10/16/16   Estill Dooms, NP    Family History Family History  Problem Relation Age of Onset  . Depression Mother   . Hypertension Mother   . Other Mother     open heart surgery  . Heart disease Father   . Hypertension Father   . Anxiety disorder Father   . Heart attack Father   . Stroke Maternal Grandmother   . Colon cancer Neg Hx     Social History Social History  Substance Use Topics  . Smoking status: Former Smoker    Years: 1.00    Types: Cigarettes  Quit date: 12/30/1994  . Smokeless tobacco: Never Used  . Alcohol use No     Allergies   Aspirin and Ivp dye [iodinated diagnostic agents]   Review of Systems Review of Systems  All other systems reviewed and are negative.    Physical Exam Updated Vital Signs BP 116/85 (BP Location: Right Arm)   Pulse 88   Temp 98.4 F (36.9 C) (Oral)   Resp 18   Ht 5\' 2"  (1.575 m)   Wt 131 lb (59.4 kg)   LMP 04/27/2017   SpO2 99%   BMI 23.96 kg/m   Physical Exam  Constitutional: She is oriented to person, place, and time. She appears well-developed and well-nourished. No distress.  HENT:  Head: Normocephalic and atraumatic.  Eyes: EOM are normal.  Neck: Normal range of motion.  Cardiovascular:  Normal rate, regular rhythm and normal heart sounds.   Pulmonary/Chest: Effort normal and breath sounds normal.  Abdominal: Soft. She exhibits no distension. There is tenderness. There is no rebound and no guarding.  Moderate lower abdominal tenderness. No rebound, guarding, or distension.  Musculoskeletal: Normal range of motion.  Neurological: She is alert and oriented to person, place, and time.  Skin: Skin is warm and dry.  Psychiatric: She has a normal mood and affect. Judgment normal.  Nursing note and vitals reviewed.  ED Treatments / Results  DIAGNOSTIC STUDIES: Oxygen Saturation is 99% on RA, normal by my interpretation.    COORDINATION OF CARE: 9:15 PM Discussed treatment plan with pt at bedside and pt agreed to plan. I will check the patient's blood work and start her on some pain meds.  Labs (all labs ordered are listed, but only abnormal results are displayed) Labs Reviewed  CBC WITH DIFFERENTIAL/PLATELET - Abnormal; Notable for the following:       Result Value   Hemoglobin 15.3 (*)    All other components within normal limits  I-STAT BETA HCG BLOOD, ED ([facility], [facility], [facility] ONLY)    EKG  EKG Interpretation None       Radiology No results found.  Procedures Procedures (including critical care time)  Medications Ordered in ED Medications  oxyCODONE-acetaminophen (PERCOCET/ROXICET) 5-325 MG per tablet 1 tablet (not administered)  ibuprofen (ADVIL,MOTRIN) tablet 600 mg (not administered)     Initial Impression / Assessment and Plan / ED Course  I have reviewed the triage vital signs and the nursing notes.  Pertinent labs & imaging results that were available during my care of the patient were reviewed by me and considered in my medical decision making (see chart for details).     40yF with DUB. HD stable. H/H ok. No blood thinners. Provera. GYN FU. It has been determined that no acute conditions requiring further emergency intervention are present at this time.  The patient has been advised of the diagnosis and plan. I reviewed any labs and imaging including any potential incidental findings. We have discussed signs and symptoms that warrant return to the ED and they are listed in the discharge instructions.    Final Clinical Impressions(s) / ED Diagnoses   Final diagnoses:  Vaginal bleeding    New Prescriptions Discharge Medication List as of 04/27/2017 10:00 PM    START taking these medications   Details  medroxyPROGESTERone (PROVERA) 10 MG tablet Take 1 tablet (10 mg total) by mouth daily., Starting Sun 04/27/2017, Print       I personally preformed the services scribed in my presence. The recorded information has been reviewed is accurate.  Virgel Manifold, MD.    Virgel Manifold, MD 05/03/17 [phone]

## 2017-05-19 ENCOUNTER — Ambulatory Visit: Payer: Medicaid Other | Admitting: Family

## 2017-05-20 ENCOUNTER — Encounter: Payer: Self-pay | Admitting: Family

## 2017-06-03 ENCOUNTER — Encounter: Payer: Self-pay | Admitting: Adult Health

## 2017-06-03 ENCOUNTER — Ambulatory Visit (INDEPENDENT_AMBULATORY_CARE_PROVIDER_SITE_OTHER): Payer: Medicaid Other | Admitting: Adult Health

## 2017-06-03 VITALS — BP 110/56 | HR 70 | Ht 63.0 in | Wt 127.0 lb

## 2017-06-03 DIAGNOSIS — Z3202 Encounter for pregnancy test, result negative: Secondary | ICD-10-CM

## 2017-06-03 DIAGNOSIS — R1032 Left lower quadrant pain: Secondary | ICD-10-CM

## 2017-06-03 DIAGNOSIS — R140 Abdominal distension (gaseous): Secondary | ICD-10-CM | POA: Diagnosis not present

## 2017-06-03 DIAGNOSIS — N938 Other specified abnormal uterine and vaginal bleeding: Secondary | ICD-10-CM | POA: Diagnosis not present

## 2017-06-03 DIAGNOSIS — F329 Major depressive disorder, single episode, unspecified: Secondary | ICD-10-CM

## 2017-06-03 DIAGNOSIS — F32A Depression, unspecified: Secondary | ICD-10-CM

## 2017-06-03 DIAGNOSIS — N920 Excessive and frequent menstruation with regular cycle: Secondary | ICD-10-CM

## 2017-06-03 DIAGNOSIS — F339 Major depressive disorder, recurrent, unspecified: Secondary | ICD-10-CM | POA: Insufficient documentation

## 2017-06-03 HISTORY — DX: Left lower quadrant pain: R10.32

## 2017-06-03 HISTORY — DX: Excessive and frequent menstruation with regular cycle: N92.0

## 2017-06-03 HISTORY — DX: Other specified abnormal uterine and vaginal bleeding: N93.8

## 2017-06-03 LAB — POCT URINE PREGNANCY: Preg Test, Ur: NEGATIVE

## 2017-06-03 MED ORDER — ESCITALOPRAM OXALATE 20 MG PO TABS: 20.0000 mg | ORAL_TABLET | Freq: Every day | ORAL | 6 refills | Status: DC

## 2017-06-03 NOTE — Progress Notes (Signed)
Subjective:     Patient ID: Laurie Howell, female   DOB: [birth year], 41 y.o.   MRN: [id number]  HPI Margel is a 41 year old white female, married in complaining of bleeding for about 3 months, is heavy and is passing"Meat".  She has pain in left side and bloats.Was seen in ER at [facility] 04/27/17, was given provera and bleeding stopped for 1 week then resumed. Still would like to get pregnant but can't afford HSG to check tubes. Is depressed over cousins death last week and having issues with husband over not getting pregnant, but they are working on it she says.   Review of Systems Bleeding for about 3 months LLQ pain Bloating Depressed  Reviewed past medical,surgical, social and family history. Reviewed medications and allergies.     Objective:   Physical Exam BP (!) 110/56 (BP Location: Left Arm, Patient Position: Sitting, Cuff Size: Normal)   Pulse 70   Ht 5\' 3"  (1.6 m)   Wt 127 lb (57.6 kg)   LMP 06/01/2017   BMI 22.50 kg/m UPT negative, Skin warm and dry.Pelvic: external genitalia is normal in appearance no lesions, vagina: black period like blood,urethra has no lesions or masses noted, cervix:smooth and bulbous, uterus: normal size, shape and contour, non tender, no masses felt, adnexa: no masses, LLQ tenderness noted. Bladder is non tender and no masses felt.    PHQ 9 score 14, on lexapro, but will increase to 20 mg. Denies being suicidal. Will get [place] to assess uterus.  Assessment:     1. Menorrhagia with regular cycle   2. LLQ pain   3. Pregnancy examination or test, negative result   4. DUB (dysfunctional uterine bleeding)   5. Depression, unspecified depression type   6. Abdominal bloating       Plan:     Rx lexapro 20 mg take 1 daily #30 with 6 refills Return  in 1 week for GYN US,will talk when results back F/U in 6 weeks on lexapro increase

## 2017-06-05 ENCOUNTER — Other Ambulatory Visit (HOSPITAL_COMMUNITY): Payer: Self-pay | Admitting: *Deleted

## 2017-06-05 DIAGNOSIS — N736 Female pelvic peritoneal adhesions (postinfective): Secondary | ICD-10-CM

## 2017-06-17 ENCOUNTER — Ambulatory Visit (INDEPENDENT_AMBULATORY_CARE_PROVIDER_SITE_OTHER): Payer: Medicaid Other

## 2017-06-17 DIAGNOSIS — N938 Other specified abnormal uterine and vaginal bleeding: Secondary | ICD-10-CM

## 2017-06-17 DIAGNOSIS — N920 Excessive and frequent menstruation with regular cycle: Secondary | ICD-10-CM

## 2017-06-17 DIAGNOSIS — R1032 Left lower quadrant pain: Secondary | ICD-10-CM | POA: Diagnosis not present

## 2017-06-17 NOTE — Progress Notes (Signed)
PELVIC US TA/TV:homogeneous anteverted uterus w/a  8 x 8 2.7 mm intramural fibroid mid/left uterus N/C,normal ovaries,ovaries appear mobile,EEC 5.3 mm,no free fluid,mult small nabothian cysts

## 2017-06-18 ENCOUNTER — Encounter: Payer: Self-pay | Admitting: Adult Health

## 2017-06-18 ENCOUNTER — Telehealth: Payer: Self-pay | Admitting: Adult Health

## 2017-06-18 DIAGNOSIS — D251 Intramural leiomyoma of uterus: Secondary | ICD-10-CM

## 2017-06-18 DIAGNOSIS — D219 Benign neoplasm of connective and other soft tissue, unspecified: Secondary | ICD-10-CM | POA: Insufficient documentation

## 2017-06-18 HISTORY — DX: Benign neoplasm of connective and other soft tissue, unspecified: D21.9

## 2017-06-18 NOTE — Telephone Encounter (Signed)
Pt aware [place] normal except for small fibroid, she says bleeding has stopped, if resumes and is heavy can try megace

## 2017-07-15 ENCOUNTER — Ambulatory Visit: Payer: Medicaid Other | Admitting: Adult Health

## 2017-07-23 ENCOUNTER — Telehealth: Payer: Self-pay | Admitting: Adult Health

## 2017-07-23 ENCOUNTER — Ambulatory Visit: Payer: Medicaid Other | Admitting: Adult Health

## 2017-07-23 MED ORDER — MEGESTROL ACETATE 40 MG PO TABS: ORAL_TABLET | ORAL | 1 refills | Status: DC

## 2017-07-23 NOTE — Telephone Encounter (Signed)
Spoke with pt. Pt has started bleeding again and is requesting something to help stop it. Please advise. Thanks!! [place]

## 2017-07-23 NOTE — Telephone Encounter (Signed)
Will rx megace 

## 2017-08-04 ENCOUNTER — Ambulatory Visit: Payer: Medicaid Other | Admitting: Adult Health

## 2017-08-12 ENCOUNTER — Encounter: Payer: Self-pay | Admitting: Adult Health

## 2017-08-12 ENCOUNTER — Ambulatory Visit (INDEPENDENT_AMBULATORY_CARE_PROVIDER_SITE_OTHER): Payer: Medicaid Other | Admitting: Adult Health

## 2017-08-12 VITALS — BP 110/70 | HR 78 | Ht 62.0 in | Wt 130.0 lb

## 2017-08-12 DIAGNOSIS — F32A Depression, unspecified: Secondary | ICD-10-CM

## 2017-08-12 DIAGNOSIS — M792 Neuralgia and neuritis, unspecified: Secondary | ICD-10-CM

## 2017-08-12 DIAGNOSIS — F329 Major depressive disorder, single episode, unspecified: Secondary | ICD-10-CM | POA: Diagnosis not present

## 2017-08-12 NOTE — Progress Notes (Signed)
Subjective:     Patient ID: DRENA HAM, female   DOB: 10-06-1976, 41 y.o.   MRN: [id number]  HPI Lauralyn is a 41 year old white female, back in follow up of increasing lexapro to 20 mg and she is better,but has pain on sides.   Review of Systems Pain on sides Feels better on lexapro 20 mg  Reviewed past medical,surgical, social and family history. Reviewed medications and allergies.     Objective:   Physical Exam BP 110/70 (BP Location: Left Arm, Patient Position: Sitting, Cuff Size: Normal)   Pulse 78   Ht 5\' 2"  (1.575 m)   Wt 130 lb (59 kg)   LMP 07/29/2017 (Approximate)   BMI 23.78 kg/m   PHQ 9 score 9, which is better was 14 06/03/17, and she says she feels better   + pain on skin on abdomen when touched, told her was probably  nerve related.   Assessment:     1. Depression, unspecified depression type   2. Nerve pain       Plan:     Continue lexapro 20 mg Follow up in 3 months See PCP about nerve pain

## 2017-08-12 NOTE — Patient Instructions (Signed)
Continue lexapro F/U in 3 months

## 2017-10-08 ENCOUNTER — Ambulatory Visit (INDEPENDENT_AMBULATORY_CARE_PROVIDER_SITE_OTHER): Payer: Medicaid Other

## 2017-10-08 ENCOUNTER — Ambulatory Visit (INDEPENDENT_AMBULATORY_CARE_PROVIDER_SITE_OTHER): Payer: Medicaid Other | Admitting: Family

## 2017-10-08 ENCOUNTER — Encounter: Payer: Self-pay | Admitting: Family

## 2017-10-08 VITALS — BP 116/78 | HR 85 | Temp 98.4°F | Ht 62.0 in | Wt 142.2 lb

## 2017-10-08 DIAGNOSIS — K5900 Constipation, unspecified: Secondary | ICD-10-CM | POA: Diagnosis not present

## 2017-10-08 DIAGNOSIS — R1030 Lower abdominal pain, unspecified: Secondary | ICD-10-CM | POA: Diagnosis not present

## 2017-10-08 MED ORDER — POLYETHYLENE GLYCOL 3350 17 GM/SCOOP PO POWD: 17.0000 g | Freq: Two times a day (BID) | ORAL | 1 refills | Status: DC | PRN

## 2017-10-08 NOTE — Progress Notes (Signed)
   Subjective:    Patient ID: Laurie Howell, female    DOB: 1976/07/13, 41 y.o.   MRN: [id number]  Pt presents to the office today with lower abdominal pain that started three months ago. Pt has seen her GYN who did an [place] and transvaginal US and could not find the cause.  Abdominal Pain  This is a new problem. The current episode started more than 1 month ago. The onset quality is gradual. The problem occurs intermittently. The pain is located in the RLQ and LLQ. The pain is at a severity of 10/10. The pain is moderate. The quality of the pain is tearing and sharp. The abdominal pain does not radiate. Pertinent negatives include no belching, constipation, dysuria, fever, frequency, hematuria, nausea, vomiting or weight loss. Associated symptoms comments: bloating. The pain is aggravated by eating. The pain is relieved by nothing. The treatment provided no relief.      Review of Systems  Constitutional: Negative for fever and weight loss.  Gastrointestinal: Positive for abdominal pain. Negative for constipation, nausea and vomiting.  Genitourinary: Negative for dysuria, frequency and hematuria.  All other systems reviewed and are negative.      Objective:   Physical Exam  Constitutional: She is oriented to person, place, and time. She appears well-developed and well-nourished. No distress.  HENT:  Head: Normocephalic.  Neck: Normal range of motion. Neck supple. No thyromegaly present.  Cardiovascular: Normal rate, regular rhythm, normal heart sounds and intact distal pulses.   No murmur heard. Pulmonary/Chest: Effort normal and breath sounds normal. No respiratory distress. She has no wheezes.  Abdominal: Soft. Bowel sounds are normal. She exhibits no distension. There is tenderness (RLQ and LLQ).  Musculoskeletal: Normal range of motion. She exhibits no edema or tenderness.  Neurological: She is alert and oriented to person, place, and time.  Skin: Skin is warm and dry.  Psychiatric:  She has a normal mood and affect. Her behavior is normal. Judgment and thought content normal.  Vitals reviewed.  KUB- Large amount of stool in colon, Preliminary reading by Evelina Dun, FNP [facility]   BP 116/78   Pulse 85   Temp 98.4 F (36.9 C) (Oral)   Ht 5\' 2"  (1.575 m)   Wt 142 lb 3.2 oz (64.5 kg)   BMI 26.01 kg/m      Assessment & Plan:  1. Lower abdominal pain - DG Abd 1 View; Future  2. Constipation, unspecified constipation type Force fluids  Start Miralax Encourage healthy diet and exercise RTO prn - polyethylene glycol powder (GLYCOLAX/MIRALAX) powder; Take 17 g by mouth 2 (two) times daily as needed.  Dispense: 3350 g; Refill: Start, FNP

## 2017-10-08 NOTE — Patient Instructions (Signed)

## 2017-10-27 ENCOUNTER — Ambulatory Visit (INDEPENDENT_AMBULATORY_CARE_PROVIDER_SITE_OTHER): Payer: Medicaid Other | Admitting: Family

## 2017-10-27 ENCOUNTER — Encounter: Payer: Self-pay | Admitting: Family

## 2017-10-27 VITALS — BP 110/73 | HR 91 | Temp 97.5°F | Ht 62.0 in | Wt 141.0 lb

## 2017-10-27 DIAGNOSIS — R1030 Lower abdominal pain, unspecified: Secondary | ICD-10-CM | POA: Diagnosis not present

## 2017-10-27 DIAGNOSIS — N938 Other specified abnormal uterine and vaginal bleeding: Secondary | ICD-10-CM

## 2017-10-27 MED ORDER — MEDROXYPROGESTERONE ACETATE 10 MG PO TABS: 10.0000 mg | ORAL_TABLET | Freq: Every day | ORAL | 1 refills | Status: DC

## 2017-10-27 NOTE — Progress Notes (Signed)
   Subjective:    Patient ID: Laurie Howell, female    DOB: 1976/02/24, 41 y.o.   MRN: [id number]  Pt presents to the office today with vaginal bleeding for the last two months. Pt had a tubal reversal 2 years ago in hopes of becoming pregnant, but has not been able to become pregnant and has had menorrhagia.   Pt has had several vaginal [place] that have been negative. Pt has follow up appt with Gyn next month.  They have recommended a hysterometry.    Pt states her vaginal bleeding is becoming worse and goes through about 10-15 tampons a day and will bleed through these. Complaining of intermittent cramping of 8 out 10.  Abdominal Pain  This is a recurrent problem. The current episode started more than 1 month ago. The onset quality is gradual. The problem occurs constantly. The problem has been gradually worsening. The pain is located in the suprapubic region. The pain is at a severity of 10/10. The pain is moderate. The quality of the pain is sharp. The abdominal pain does not radiate. Associated symptoms include constipation. Pertinent negatives include no belching, diarrhea, dysuria, fever, flatus, hematuria, nausea or vomiting. The pain is aggravated by movement. The pain is relieved by being still. She has tried acetaminophen for the symptoms. The treatment provided no relief.      Review of Systems  Constitutional: Negative for fever.  Gastrointestinal: Positive for abdominal pain and constipation. Negative for diarrhea, flatus, nausea and vomiting.  Genitourinary: Positive for menstrual problem and vaginal bleeding. Negative for dysuria, hematuria and vaginal discharge.  All other systems reviewed and are negative.      Objective:   Physical Exam  Constitutional: She is oriented to person, place, and time. She appears well-developed and well-nourished. No distress.  HENT:  Head: Normocephalic.  Eyes: Pupils are equal, round, and reactive to light.  Neck: Normal range of motion. Neck  supple. No thyromegaly present.  Cardiovascular: Normal rate, regular rhythm, normal heart sounds and intact distal pulses.   No murmur heard. Pulmonary/Chest: Effort normal and breath sounds normal. No respiratory distress. She has no wheezes.  Abdominal: Soft. Bowel sounds are normal. She exhibits no distension. There is tenderness. There is guarding.  Musculoskeletal: Normal range of motion. She exhibits no edema or tenderness.  Neurological: She is alert and oriented to person, place, and time.  Skin: Skin is warm and dry.  Psychiatric: She has a normal mood and affect. Her behavior is normal. Judgment and thought content normal.  Vitals reviewed.     BP 110/73   Pulse 91   Temp (!) 97.5 F (36.4 C) (Oral)   Ht 5\' 2"  (1.575 m)   Wt 141 lb (64 kg)   BMI 25.79 kg/m      Assessment & Plan:  1. Lower abdominal pain - CT Abdomen Pelvis W Contrast; Future - Anemia Profile B  2. DUB (dysfunctional uterine bleeding) - medroxyPROGESTERone (PROVERA) 10 MG tablet; Take 1 tablet (10 mg total) by mouth daily.  Dispense: 10 tablet; Refill: 1 - Anemia Profile B  Labs pending Keep follow up with Gyn  Will do Ct scan since abdominal is recurrent RTo prn or if symptoms do not improve or worsen   Evelina Dun, FNP

## 2017-10-27 NOTE — Patient Instructions (Signed)
Abnormal Uterine Bleeding Abnormal uterine bleeding can affect women at various stages in life, including teenagers, women in their reproductive years, pregnant women, and women who have reached menopause. Several kinds of uterine bleeding are considered abnormal, including:  Bleeding or spotting between periods.  Bleeding after sexual intercourse.  Bleeding that is heavier or more than normal.  Periods that last longer than usual.  Bleeding after menopause. Many cases of abnormal uterine bleeding are minor and simple to treat, while others are more serious. Any type of abnormal bleeding should be evaluated by your health care provider. Treatment will depend on the cause of the bleeding. Follow these instructions at home: Monitor your condition for any changes. The following actions may help to alleviate any discomfort you are experiencing:  Avoid the use of tampons and douches as directed by your health care provider.  Change your pads frequently. You should get regular pelvic exams and Pap tests. Keep all follow-up appointments for diagnostic tests as directed by your health care provider. Contact a health care provider if:  Your bleeding lasts more than 1 week.  You feel dizzy at times. Get help right away if:  You pass out.  You are changing pads every 15 to 30 minutes.  You have abdominal pain.  You have a fever.  You become sweaty or weak.  You are passing large blood clots from the vagina.  You start to feel nauseous and vomit. This information is not intended to replace advice given to you by your health care provider. Make sure you discuss any questions you have with your health care provider. Document Released: 12/16/2005 Document Revised: 05/29/2016 Document Reviewed: 07/15/2013 Elsevier Interactive Patient Education  2017 Elsevier Inc.  

## 2017-10-28 LAB — ANEMIA PROFILE B
Basophils Absolute: 0 10*3/uL (ref 0.0–0.2)
Basos: 0 %
EOS (ABSOLUTE): 0.2 10*3/uL (ref 0.0–0.4)
EOS: 2 %
Ferritin: 109 ng/mL (ref 15–150)
Folate: 16.4 ng/mL (ref >3.0)
HEMOGLOBIN: 13.6 g/dL (ref 11.1–15.9)
Hematocrit: 40.9 % (ref 34.0–46.6)
IMMATURE GRANS (ABS): 0 10*3/uL (ref 0.0–0.1)
IMMATURE GRANULOCYTES: 0 %
Iron Saturation: 49 % (ref 15–55)
Iron: 143 ug/dL (ref 27–159)
LYMPHS: 24 %
Lymphocytes Absolute: 1.9 10*3/uL (ref 0.7–3.1)
MCH: 32.2 pg (ref 26.6–33.0)
MCHC: 33.3 g/dL (ref 31.5–35.7)
MCV: 97 fL (ref 79–97)
MONOCYTES: 7 %
MONOS ABS: 0.5 10*3/uL (ref 0.1–0.9)
NEUTROS PCT: 67 %
Neutrophils Absolute: 5.1 10*3/uL (ref 1.4–7.0)
Platelets: 287 10*3/uL (ref 150–379)
RBC: 4.22 x10E6/uL (ref 3.77–5.28)
RDW: 12.8 % (ref 12.3–15.4)
RETIC CT PCT: 1.2 % (ref 0.6–2.6)
Total Iron Binding Capacity: 290 ug/dL (ref 250–450)
UIBC: 147 ug/dL (ref 131–425)
Vitamin B-12: 599 pg/mL (ref 232–1245)
WBC: 7.6 10*3/uL (ref 3.4–10.8)

## 2017-11-04 ENCOUNTER — Other Ambulatory Visit: Payer: Self-pay | Admitting: Family

## 2017-11-04 DIAGNOSIS — R1030 Lower abdominal pain, unspecified: Secondary | ICD-10-CM

## 2017-11-12 ENCOUNTER — Ambulatory Visit: Payer: Medicaid Other | Admitting: Adult Health

## 2017-11-17 ENCOUNTER — Ambulatory Visit (HOSPITAL_COMMUNITY)
Admission: RE | Admit: 2017-11-17 | Discharge: 2017-11-17 | Disposition: A | Discharge status: Home / Self Care | Payer: Medicaid Other | Source: Ambulatory Visit | Attending: Family | Admitting: Family

## 2017-11-17 DIAGNOSIS — R1030 Lower abdominal pain, unspecified: Secondary | ICD-10-CM | POA: Insufficient documentation

## 2017-11-17 DIAGNOSIS — N83201 Unspecified ovarian cyst, right side: Secondary | ICD-10-CM | POA: Diagnosis not present

## 2017-12-01 ENCOUNTER — Ambulatory Visit: Payer: Medicaid Other | Admitting: Adult Health

## 2018-01-04 ENCOUNTER — Other Ambulatory Visit: Payer: Self-pay | Admitting: Adult Health

## 2018-01-25 ENCOUNTER — Other Ambulatory Visit: Payer: Self-pay | Admitting: Adult Health

## 2018-03-12 ENCOUNTER — Encounter: Payer: Self-pay | Admitting: Family

## 2018-03-12 ENCOUNTER — Ambulatory Visit: Payer: Medicaid Other | Admitting: Family

## 2018-03-12 VITALS — BP 119/83 | HR 77 | Temp 97.3°F | Ht 62.0 in | Wt 135.4 lb

## 2018-03-12 DIAGNOSIS — N920 Excessive and frequent menstruation with regular cycle: Secondary | ICD-10-CM | POA: Diagnosis not present

## 2018-03-12 DIAGNOSIS — D219 Benign neoplasm of connective and other soft tissue, unspecified: Secondary | ICD-10-CM | POA: Diagnosis not present

## 2018-03-12 DIAGNOSIS — N938 Other specified abnormal uterine and vaginal bleeding: Secondary | ICD-10-CM

## 2018-03-12 MED ORDER — MEDROXYPROGESTERONE ACETATE 10 MG PO TABS: 10.0000 mg | ORAL_TABLET | Freq: Every day | ORAL | 1 refills | Status: DC

## 2018-03-12 NOTE — Patient Instructions (Signed)

## 2018-03-12 NOTE — Progress Notes (Signed)
   Subjective:    Patient ID: Laurie Howell, female    DOB: 08/15/1976, 42 y.o.   MRN: [id number]  PT presents to the office today with DUB. PT has been followed with GYN who recommends a hysteromoy  Vaginal Bleeding  The patient's primary symptoms include pelvic pain. The patient's pertinent negatives include no genital itching or genital odor. This is a chronic problem. The current episode started more than 1 year ago. The problem occurs intermittently. The pain is moderate.      Review of Systems  Genitourinary: Positive for pelvic pain and vaginal bleeding.  All other systems reviewed and are negative.      Objective:   Physical Exam  Constitutional: She is oriented to person, place, and time. She appears well-developed and well-nourished. No distress.  HENT:  Head: Normocephalic and atraumatic.  Right Ear: External ear normal.  Left Ear: External ear normal.  Nose: Nose normal.  Mouth/Throat: Oropharynx is clear and moist.  Eyes: Pupils are equal, round, and reactive to light.  Neck: Normal range of motion. Neck supple. No thyromegaly present.  Cardiovascular: Normal rate, regular rhythm, normal heart sounds and intact distal pulses.  No murmur heard. Pulmonary/Chest: Effort normal and breath sounds normal. No respiratory distress. She has no wheezes.  Abdominal: Soft. Bowel sounds are normal. She exhibits no distension. There is tenderness (mild lower abd).  Musculoskeletal: Normal range of motion. She exhibits no edema or tenderness.  Neurological: She is alert and oriented to person, place, and time. She has normal reflexes. No cranial nerve deficit.  Skin: Skin is warm and dry.  Psychiatric: She has a normal mood and affect. Her behavior is normal. Judgment and thought content normal.  Vitals reviewed.     BP 119/83   Pulse 77   Temp (!) 97.3 F (36.3 C) (Oral)   Ht 5\' 2"  (1.575 m)   Wt 135 lb 6.4 oz (61.4 kg)   BMI 24.76 kg/m      Assessment & Plan:  1.  DUB (dysfunctional uterine bleeding) - medroxyPROGESTERone (PROVERA) 10 MG tablet; Take 1 tablet (10 mg total) by mouth daily.  Dispense: 10 tablet; Refill: 1 - CBC with Differential/Platelet  2. Fibroid - CBC with Differential/Platelet  3. Menorrhagia with regular cycle - CBC with Differential/Platelet  Iron rich diet Keep all follow up with GYN RTO prn   Evelina Dun, FNP

## 2018-03-13 LAB — CBC WITH DIFFERENTIAL/PLATELET
Basophils Absolute: 0.1 10*3/uL (ref 0.0–0.2)
Basos: 1 %
EOS (ABSOLUTE): 0.2 10*3/uL (ref 0.0–0.4)
EOS: 3 %
HEMATOCRIT: 40.5 % (ref 34.0–46.6)
Hemoglobin: 13.5 g/dL (ref 11.1–15.9)
Immature Grans (Abs): 0 10*3/uL (ref 0.0–0.1)
Immature Granulocytes: 0 %
LYMPHS ABS: 1.5 10*3/uL (ref 0.7–3.1)
Lymphs: 27 %
MCH: 32.1 pg (ref 26.6–33.0)
MCHC: 33.3 g/dL (ref 31.5–35.7)
MCV: 96 fL (ref 79–97)
MONOS ABS: 0.5 10*3/uL (ref 0.1–0.9)
Monocytes: 9 %
NEUTROS ABS: 3.4 10*3/uL (ref 1.4–7.0)
Neutrophils: 60 %
Platelets: 342 10*3/uL (ref 150–379)
RBC: 4.21 x10E6/uL (ref 3.77–5.28)
RDW: 13 % (ref 12.3–15.4)
WBC: 5.7 10*3/uL (ref 3.4–10.8)

## 2018-03-17 ENCOUNTER — Encounter: Payer: Self-pay | Admitting: Family

## 2018-03-17 ENCOUNTER — Ambulatory Visit: Payer: Medicaid Other | Admitting: Family

## 2018-03-17 VITALS — BP 112/79 | HR 89 | Temp 97.1°F | Ht 62.0 in | Wt 134.8 lb

## 2018-03-17 DIAGNOSIS — Z23 Encounter for immunization: Secondary | ICD-10-CM

## 2018-03-17 DIAGNOSIS — S80212A Abrasion, left knee, initial encounter: Secondary | ICD-10-CM

## 2018-03-17 DIAGNOSIS — R55 Syncope and collapse: Secondary | ICD-10-CM | POA: Diagnosis not present

## 2018-03-17 DIAGNOSIS — M25571 Pain in right ankle and joints of right foot: Secondary | ICD-10-CM

## 2018-03-17 NOTE — Patient Instructions (Signed)
Syncope Syncope is when you temporarily lose consciousness. Syncope may also be called fainting or passing out. It is caused by a sudden decrease in blood flow to the brain. Even though most causes of syncope are not dangerous, syncope can be a sign of a serious medical problem. Signs that you may be about to faint include:  Feeling dizzy or light-headed.  Feeling nauseous.  Seeing all white or all black in your field of vision.  Having cold, clammy skin.  If you fainted, get medical help right away.Call your local emergency services (911 in the [place] Do not drive yourself to the hospital. Follow these instructions at home: Pay attention to any changes in your symptoms. Take these actions to help with your condition:  Have someone stay with you until you feel stable.  Do not drive, use machinery, or play sports until your health care provider says it is okay.  Keep all follow-up visits as told by your health care provider. This is important.  If you start to feel like you might faint, lie down right away and raise (elevate) your feet above the level of your heart. Breathe deeply and steadily. Wait until all of the symptoms have passed.  Drink enough fluid to keep your urine clear or pale yellow.  If you are taking blood pressure or heart medicine, get up slowly and take several minutes to sit and then stand. This can reduce dizziness.  Take over-the-counter and prescription medicines only as told by your health care provider.  Get help right away if:  You have a severe headache.  You have unusual pain in your chest, abdomen, or back.  You are bleeding from your mouth or rectum, or you have black or tarry stool.  You have a very fast or irregular heartbeat (palpitations).  You have pain with breathing.  You faint once or repeatedly.  You have a seizure.  You are confused.  You have trouble walking.  You have severe weakness.  You have vision problems. These  symptoms may represent a serious problem that is an emergency. Do not wait to see if your symptoms will go away. Get medical help right away. Call your local emergency services (911 in the [place] Do not drive yourself to the hospital. This information is not intended to replace advice given to you by your health care provider. Make sure you discuss any questions you have with your health care provider. Document Released: 12/16/2005 Document Revised: 05/23/2016 Document Reviewed: 08/30/2015 Elsevier Interactive Patient Education  2018 Elsevier Inc.  

## 2018-03-17 NOTE — Progress Notes (Signed)
   Subjective:    Patient ID: Laurie Howell, female    DOB: Aug 27, 1976, 42 y.o.   MRN: [id number]  PT presents to the office today with complaints of a syncope episode  that occurred three days ago. PT reports she was walking into the mall and her children had to "catch her". She reports she fell on her right ankle and left knee.   States she was feeling "weak and light headed" all day. Reports she has "passed out before" and thought it was related to her iron deficiency anemia. PT had a normal CBC 03/12/18.  She reports intermittent aching pain of 9 out 10. With swelling in her knee.  Loss of Consciousness  This is a new problem. The current episode started in the past 7 days. She lost consciousness for a period of 1 to 5 minutes. The symptoms are aggravated by normal activity. Associated symptoms include headaches, light-headedness and malaise/fatigue. Pertinent negatives include no aura, bladder incontinence, bowel incontinence, chest pain, focal weakness or vomiting. She has tried acetaminophen for the symptoms. The treatment provided mild relief.      Review of Systems  Constitutional: Positive for malaise/fatigue.  Cardiovascular: Positive for syncope. Negative for chest pain.  Gastrointestinal: Negative for bowel incontinence and vomiting.  Genitourinary: Negative for bladder incontinence.  Neurological: Positive for light-headedness and headaches. Negative for focal weakness.  All other systems reviewed and are negative.      Objective:   Physical Exam  Constitutional: She is oriented to person, place, and time. She appears well-developed and well-nourished. No distress.  HENT:  Head: Normocephalic and atraumatic.  Right Ear: External ear normal.  Left Ear: External ear normal.  Nose: Nose normal.  Mouth/Throat: Oropharynx is clear and moist.  Eyes: Pupils are equal, round, and reactive to light.  Neck: Normal range of motion. Neck supple. No thyromegaly present.    Cardiovascular: Normal rate, regular rhythm, normal heart sounds and intact distal pulses.  No murmur heard. Pulmonary/Chest: Effort normal and breath sounds normal. No respiratory distress. She has no wheezes.  Abdominal: Soft. Bowel sounds are normal. She exhibits no distension. There is no tenderness.  Musculoskeletal: Normal range of motion. She exhibits edema and tenderness (mild swellind and ecchymosis of left knee).  Neurological: She is alert and oriented to person, place, and time.  Skin: Skin is warm and dry.  Abrasion on left knee   Psychiatric: She has a normal mood and affect. Her behavior is normal. Judgment and thought content normal.  Vitals reviewed.      BP 112/79   Pulse 89   Temp (!) 97.1 F (36.2 C) (Oral)   Ht 5\' 2"  (1.575 m)   Wt 134 lb 12.8 oz (61.1 kg)   BMI 24.66 kg/m      Assessment & Plan:  1. Syncope, unspecified syncope type - Ambulatory referral to Neurology  2. Abrasion of left knee, initial encounter  3. Acute right ankle pain  Rest Ice Motrin  RTO prn and keep follow up appt with specialists    Evelina Dun, FNP

## 2018-03-17 NOTE — Addendum Note (Signed)
Addended by: Shelbie Ammons on: 03/17/2018 05:06 PM   Modules accepted: Orders

## 2018-03-18 ENCOUNTER — Encounter: Payer: Self-pay | Admitting: Neurology

## 2018-04-07 ENCOUNTER — Ambulatory Visit: Payer: Medicaid Other | Admitting: Family Medicine

## 2018-04-07 ENCOUNTER — Encounter: Payer: Self-pay | Admitting: Family Medicine

## 2018-04-07 ENCOUNTER — Telehealth: Payer: Self-pay | Admitting: Family Medicine

## 2018-04-07 VITALS — BP 104/75 | HR 88 | Temp 98.0°F | Ht 62.0 in | Wt 133.2 lb

## 2018-04-07 DIAGNOSIS — N938 Other specified abnormal uterine and vaginal bleeding: Secondary | ICD-10-CM | POA: Diagnosis not present

## 2018-04-07 DIAGNOSIS — Z3042 Encounter for surveillance of injectable contraceptive: Secondary | ICD-10-CM

## 2018-04-07 LAB — PREGNANCY, URINE: PREG TEST UR: NEGATIVE

## 2018-04-07 MED ORDER — MEDROXYPROGESTERONE ACETATE 150 MG/ML IM SUSP
150.0000 mg | Freq: Once | INTRAMUSCULAR | Status: AC
  Administered 2018-04-07: 150 mg via INTRAMUSCULAR

## 2018-04-07 MED ORDER — MEGESTROL ACETATE 40 MG PO TABS: 20.0000 mg | ORAL_TABLET | Freq: Every day | ORAL | 0 refills | Status: DC

## 2018-04-07 MED ORDER — MEDROXYPROGESTERONE ACETATE 150 MG/ML IM SUSP: 150.0000 mg | INTRAMUSCULAR | 4 refills | Status: DC

## 2018-04-07 NOTE — Telephone Encounter (Signed)
Pain of Megace dose after discussion with GYN.  Will recommend patient take 40 mg 3 times daily until bleeding is controlled, this typically takes 5 days, then 40 mg daily.     I would recommend going ahead and trying this with the number that I have given her and when she is finished stop the medication to see if the Depo - Provera will keep her bleeding controlled  Laroy Apple, MD [facility] 04/07/2018, 3:26 PM

## 2018-04-07 NOTE — Progress Notes (Signed)
   HPI  Patient presents today here for vaginal bleeding.  Patient explains that over the last 20 years she has had worsening dysfunctional bleeding.  Patient states over the last several months it has become very intense.  She previously had a tubal, she had a reversed about 3 years ago to try and conceive, she states that she no longer wants to conceive, her primary concern right now is trying to stop her current vaginal bleeding.  She reports using 20+ tampons or pads daily for the last 2 months. She does have a history of migraine headaches, she has never had a blood clot, she is not a smoker.  PMH: Smoking status noted ROS: Per HPI  Objective: BP 104/75   Pulse 88   Temp 98 F (36.7 C) (Oral)   Ht '5\' 2"'$  (1.575 m)   Wt 133 lb 3.2 oz (60.4 kg)   BMI 24.36 kg/m  Gen: NAD, alert, cooperative with exam HEENT: NCAT CV: RRR, good S1/S2, no murmur Resp: CTABL, no wheezes, non-labored Abd: Mild tenderness to palpation bilateral pelvic area, no tenderness to palpation over the suprapubic area Ext: No edema, warm Neuro: Alert and oriented, No gross deficits   Assessment and plan:  #Dysfunctional uterine bleeding Patient with heavy likely anovulatory bleeding Recommended follow-up with GYN, she will pursue this. Starting Depo  Provera u preg  Add megace for short term vaginal bleeding control.  Discussed risks and benefits of using medication   Orders Placed This Encounter  Procedures  . CBC with Differential/Platelet  . Pregnancy, urine  . Thyroid Panel With TSH  . CMP14+EGFR    Meds ordered this encounter  Medications  . medroxyPROGESTERone (DEPO-PROVERA) 150 MG/ML injection    Sig: Inject 1 mL (150 mg total) into the muscle every 3 (three) months.    Dispense:  1 mL    Refill:  4  . megestrol (MEGACE) 40 MG tablet    Sig: Take 0.5-2 tablets (20-80 mg total) by mouth daily.    Dispense:  28 tablet    Refill:  0    Laroy Apple, MD [facility] 04/07/2018, 2:48 PM

## 2018-04-07 NOTE — Telephone Encounter (Signed)
Patient aware and verbalizes understanding. 

## 2018-04-07 NOTE — Addendum Note (Signed)
Addended by: Nigel Berthold C on: 04/07/2018 03:21 PM   Modules accepted: Orders

## 2018-04-07 NOTE — Patient Instructions (Addendum)
Great to see you!  I would highly recommend that you follow-up with GYN.  For the time being I would recommend starting with 1/2 tablet Megace daily, after 2-3 days if your bleeding is not improving go ahead and take 1 tablet daily for 2 days, work your way up to up to 2 tablets daily increasing every 2 days until your bleeding stops.

## 2018-04-07 NOTE — Telephone Encounter (Signed)
lmtcb

## 2018-04-08 LAB — CMP14+EGFR
A/G RATIO: 1.5 (ref 1.2–2.2)
ALK PHOS: 73 IU/L (ref 39–117)
ALT: 14 IU/L (ref 0–32)
AST: 18 IU/L (ref 0–40)
Albumin: 4.2 g/dL (ref 3.5–5.5)
BUN/Creatinine Ratio: 10 (ref 9–23)
BUN: 7 mg/dL (ref 6–24)
Bilirubin Total: 0.4 mg/dL (ref 0.0–1.2)
CHLORIDE: 102 mmol/L (ref 96–106)
CO2: 23 mmol/L (ref 20–29)
Calcium: 9.2 mg/dL (ref 8.7–10.2)
Creatinine, Ser: 0.68 mg/dL (ref 0.57–1.00)
GFR calc Af Amer: 126 mL/min/{1.73_m2} (ref >59)
GFR calc non Af Amer: 109 mL/min/{1.73_m2} (ref >59)
GLOBULIN, TOTAL: 2.8 g/dL (ref 1.5–4.5)
Glucose: 92 mg/dL (ref 65–99)
POTASSIUM: 4.3 mmol/L (ref 3.5–5.2)
SODIUM: 139 mmol/L (ref 134–144)
Total Protein: 7 g/dL (ref 6.0–8.5)

## 2018-04-08 LAB — CBC WITH DIFFERENTIAL/PLATELET
BASOS: 1 %
Basophils Absolute: 0.1 10*3/uL (ref 0.0–0.2)
EOS (ABSOLUTE): 0.1 10*3/uL (ref 0.0–0.4)
EOS: 2 %
HEMATOCRIT: 38.5 % (ref 34.0–46.6)
Hemoglobin: 12.5 g/dL (ref 11.1–15.9)
IMMATURE GRANS (ABS): 0 10*3/uL (ref 0.0–0.1)
IMMATURE GRANULOCYTES: 0 %
LYMPHS: 21 %
Lymphocytes Absolute: 1.6 10*3/uL (ref 0.7–3.1)
MCH: 31.7 pg (ref 26.6–33.0)
MCHC: 32.5 g/dL (ref 31.5–35.7)
MCV: 98 fL — ABNORMAL HIGH (ref 79–97)
MONOS ABS: 0.4 10*3/uL (ref 0.1–0.9)
Monocytes: 6 %
NEUTROS PCT: 70 %
Neutrophils Absolute: 5.3 10*3/uL (ref 1.4–7.0)
PLATELETS: 362 10*3/uL (ref 150–379)
RBC: 3.94 x10E6/uL (ref 3.77–5.28)
RDW: 13.2 % (ref 12.3–15.4)
WBC: 7.5 10*3/uL (ref 3.4–10.8)

## 2018-04-08 LAB — THYROID PANEL WITH TSH
Free Thyroxine Index: 1.4 (ref 1.2–4.9)
T3 UPTAKE RATIO: 22 % — AB (ref 24–39)
T4, Total: 6.3 ug/dL (ref 4.5–12.0)
TSH: 0.578 u[IU]/mL (ref 0.450–4.500)

## 2018-04-24 IMAGING — CT CT ABD-PELV W/O CM
2 of 4 series · 17 of 46 positions shown, 19 images · non-contrast
Comparison: 09/16/2009

CLINICAL DATA: Lower abdominal pain and swelling for 2 years.

EXAM:
CT ABDOMEN AND PELVIS WITHOUT CONTRAST
TECHNIQUE: Multidetector CT imaging of the abdomen and pelvis was performed
following the standard protocol without IV contrast.

[Series 2: axial st · axial · 0.63mm/px · z∈[-368,+57]mm · 14 of 93 slices shown, 16 images]
[im 4/93  soft-tissue]
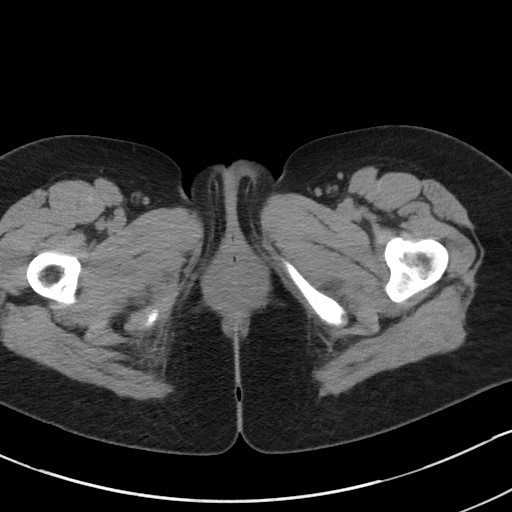
[im 4/93  bone]
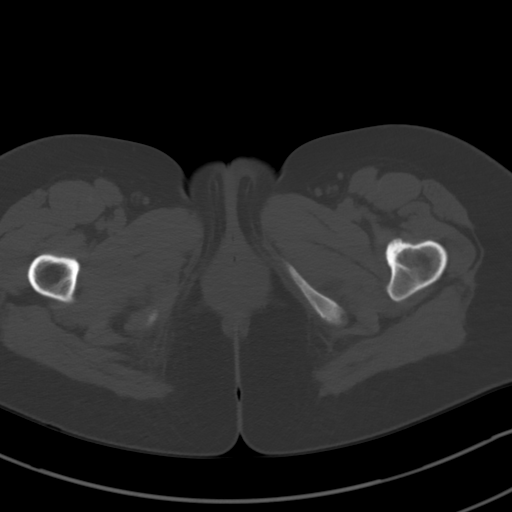
[im 12/93  soft-tissue]
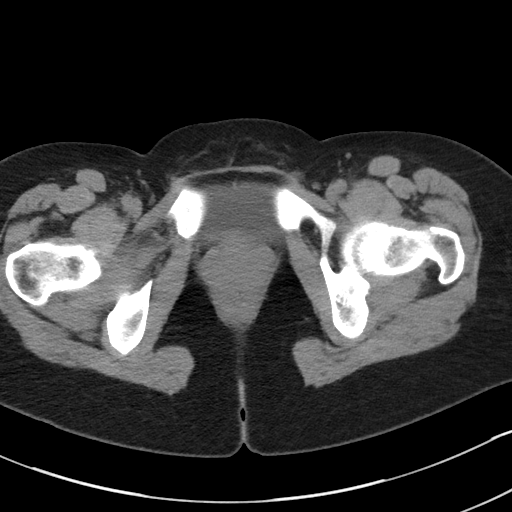
[im 20/93  soft-tissue]
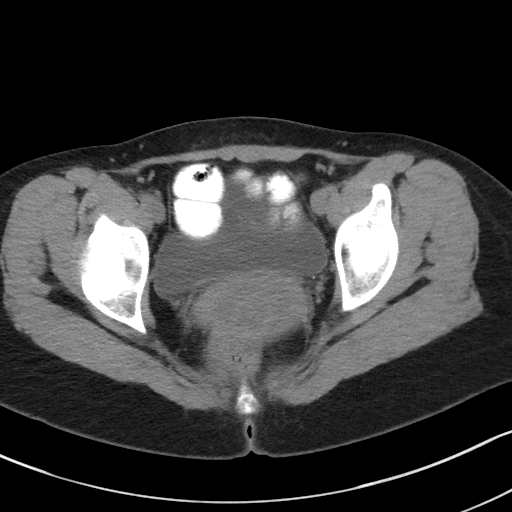
[im 24/93  soft-tissue]
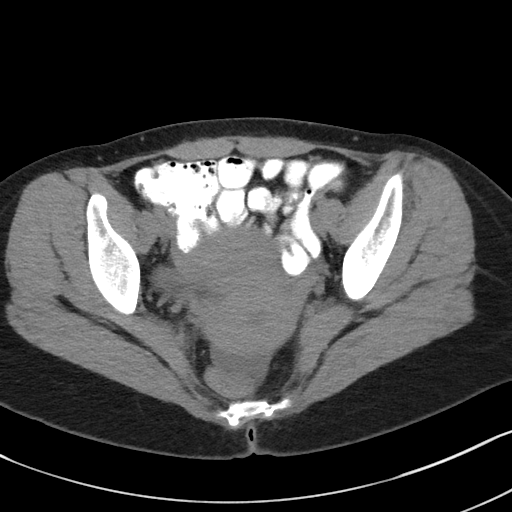
[im 31/93  soft-tissue]
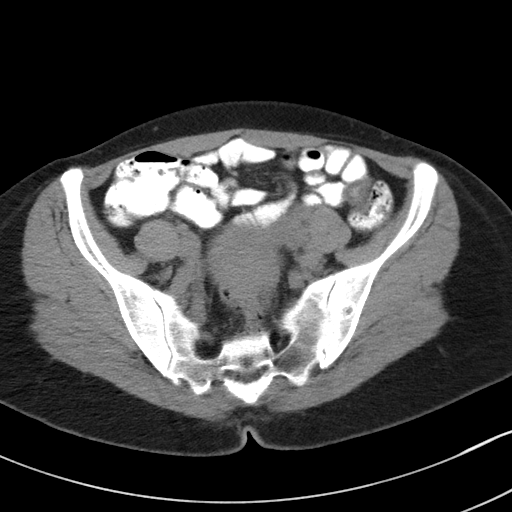
[im 39/93  soft-tissue]
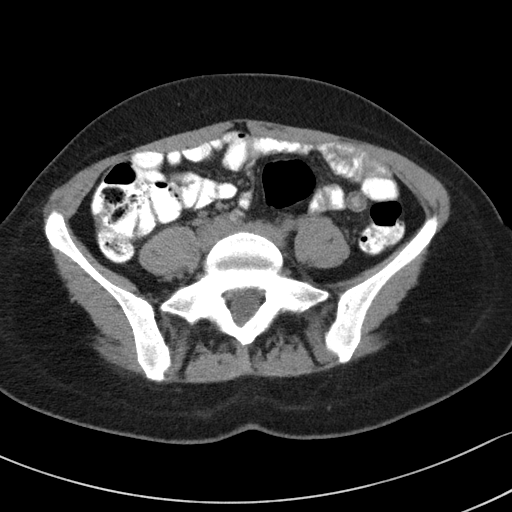
[im 43/93  soft-tissue]
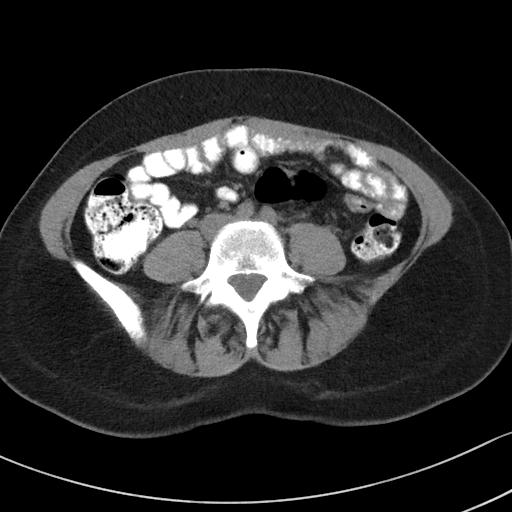
[im 50/93  soft-tissue]
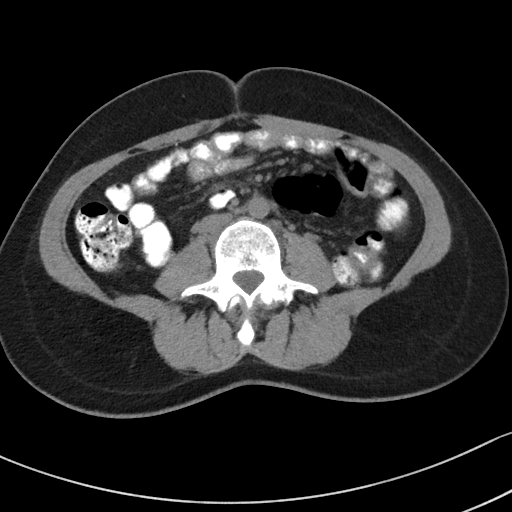
[im 54/93  soft-tissue]
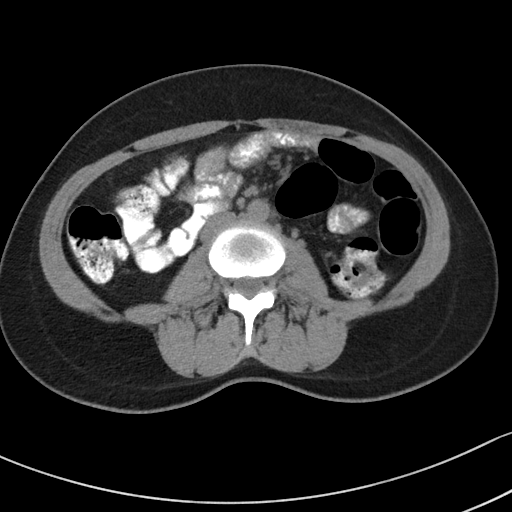
[im 54/93  bone]
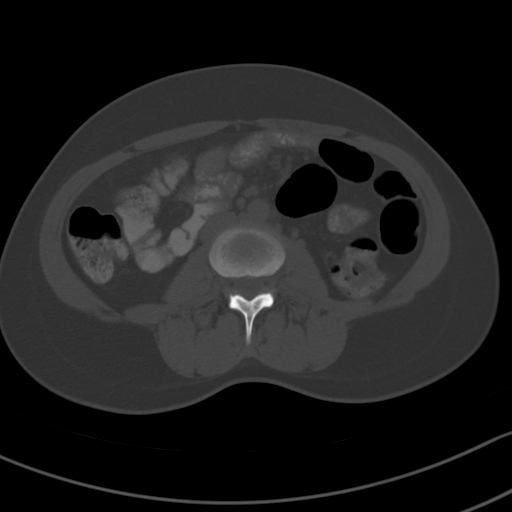
[im 62/93  soft-tissue]
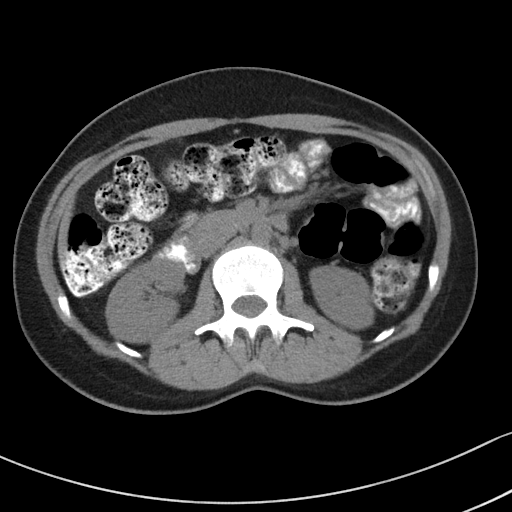
[im 70/93  soft-tissue]
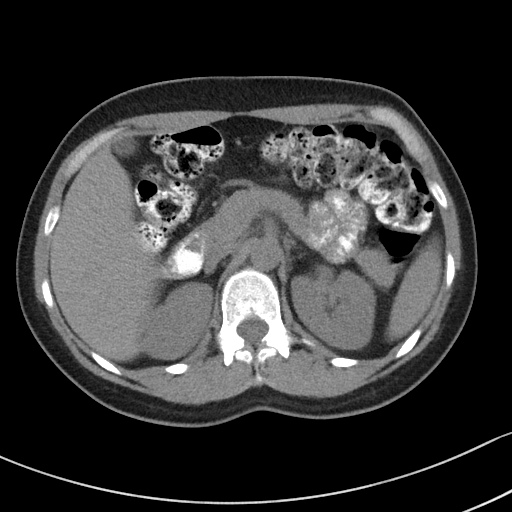
[im 73/93  soft-tissue]
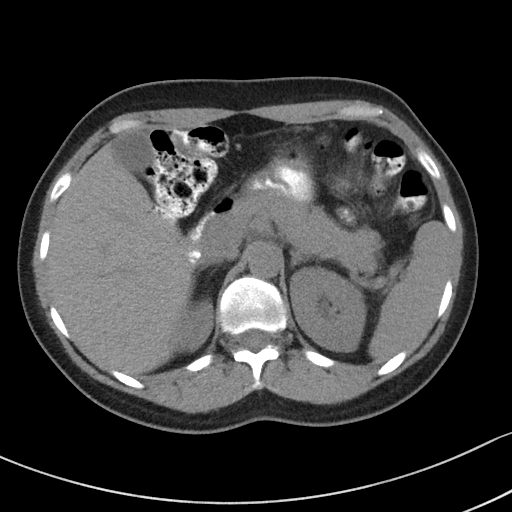
[im 81/93  soft-tissue]
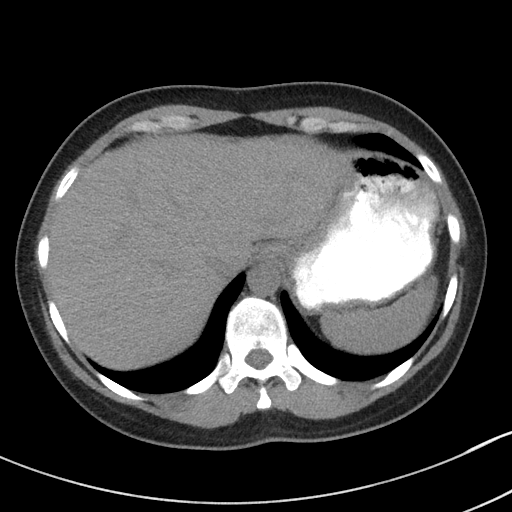
[im 89/93  soft-tissue]
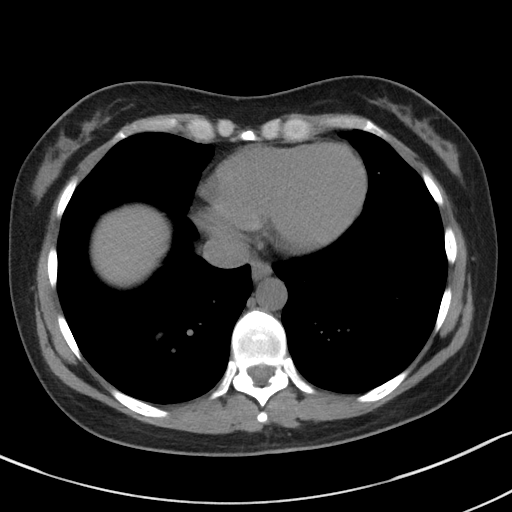

[Series 5: coronal st · coronal · 0.91mm/px · 3 of 75 slices shown]
[im 25/75  soft-tissue]
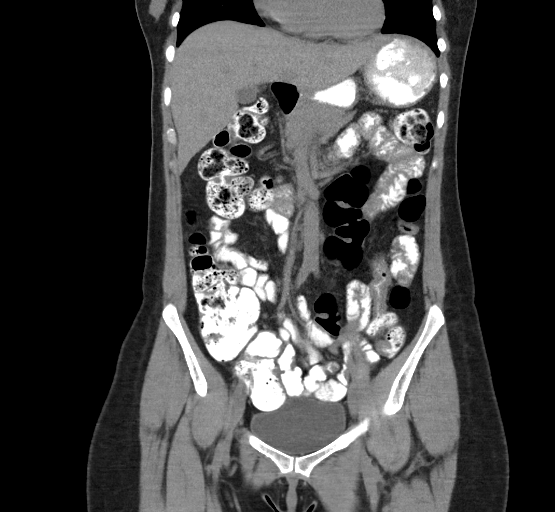
[im 33/75  soft-tissue]
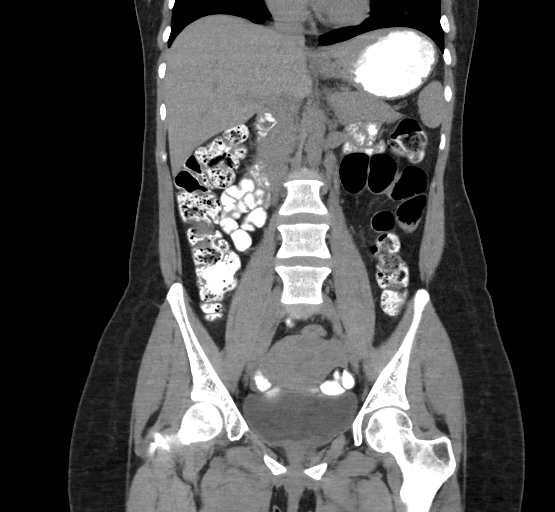
[im 42/75  soft-tissue]
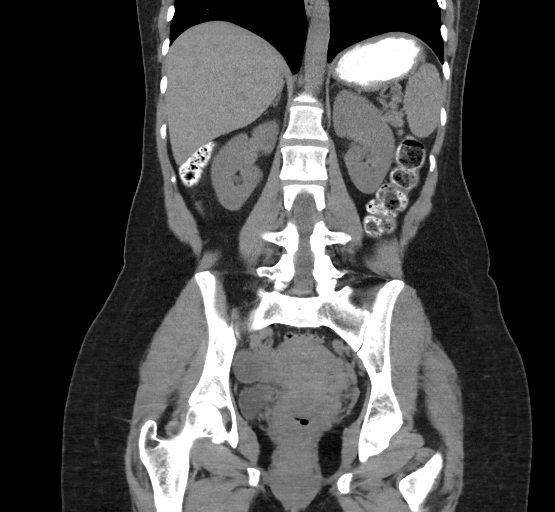

[17 of 46 positions shown; findings below may reference images not displayed]

FINDINGS: Lower chest: No acute findings.

Hepatobiliary: No mass visualized on this unenhanced exam.
Gallbladder is unremarkable.

Pancreas: No mass or inflammatory process visualized on this
unenhanced exam.

Spleen:  Within normal limits in size.

Adrenals/Urinary tract: No evidence of urolithiasis or
hydronephrosis. Unremarkable unopacified urinary bladder.

Stomach/Bowel: No evidence of obstruction, inflammatory process, or
abnormal fluid collections. Large colonic stool burden noted.

Vascular/Lymphatic: No pathologically enlarged lymph nodes
identified. No evidence of abdominal aortic aneurysm.

Reproductive: 2.6 cm benign-appearing right ovarian cyst. No mass,
inflammatory process, or free fluid identified. Tiny amount of free
fluid in pelvic cul-de-sac, likely physiologic.

Other:  None.

Musculoskeletal:  No suspicious bone lesions identified.
IMPRESSION: Small benign-appearing right ovarian cyst and tiny amount of free
fluid, most likely physiologic.

Large stool burden noted; recommend clinical correlation for
possible constipation.

## 2018-06-05 ENCOUNTER — Ambulatory Visit: Payer: Medicaid Other | Admitting: Neurology

## 2018-06-29 ENCOUNTER — Ambulatory Visit (INDEPENDENT_AMBULATORY_CARE_PROVIDER_SITE_OTHER): Payer: Medicaid Other | Admitting: *Deleted

## 2018-06-29 DIAGNOSIS — Z3042 Encounter for surveillance of injectable contraceptive: Secondary | ICD-10-CM | POA: Diagnosis not present

## 2018-06-29 MED ORDER — MEDROXYPROGESTERONE ACETATE 150 MG/ML IM SUSP
150.0000 mg | INTRAMUSCULAR | Status: DC
  Administered 2018-06-29 – 2018-09-21 (×2): 150 mg via INTRAMUSCULAR

## 2018-06-29 NOTE — Progress Notes (Signed)
Pt given Medroxyprogesterone inj Tolerated well 

## 2018-08-02 ENCOUNTER — Other Ambulatory Visit: Payer: Self-pay | Admitting: Family Medicine

## 2018-08-26 ENCOUNTER — Ambulatory Visit: Payer: Medicaid Other | Admitting: Pediatrics

## 2018-08-26 ENCOUNTER — Ambulatory Visit (INDEPENDENT_AMBULATORY_CARE_PROVIDER_SITE_OTHER): Payer: Medicaid Other

## 2018-08-26 ENCOUNTER — Encounter: Payer: Self-pay | Admitting: Pediatrics

## 2018-08-26 VITALS — BP 106/80 | HR 109 | Temp 97.0°F | Ht 62.0 in | Wt 138.6 lb

## 2018-08-26 DIAGNOSIS — R140 Abdominal distension (gaseous): Secondary | ICD-10-CM

## 2018-08-26 DIAGNOSIS — N938 Other specified abnormal uterine and vaginal bleeding: Secondary | ICD-10-CM

## 2018-08-26 DIAGNOSIS — K5900 Constipation, unspecified: Secondary | ICD-10-CM | POA: Diagnosis not present

## 2018-08-26 MED ORDER — MEDROXYPROGESTERONE ACETATE 10 MG PO TABS
10.0000 mg | ORAL_TABLET | Freq: Every day | ORAL | 0 refills | Status: DC
Start: 2018-08-26 — End: 2018-11-02

## 2018-08-26 NOTE — Patient Instructions (Addendum)
Stop megace Start provera for 10 days   1. Stir the Miralax powder into water, juice, or Gatorade.   8 capfuls of Miralax powder in 32 to 64 ounces of liquid  2. Take 4 to 8 ounces to drink every 30 minutes. It will take 4 to 6 hours to finish the medicine. 3. After the medicine is gone,  drink more water or juice. This will help with the cleanout.

## 2018-08-26 NOTE — Progress Notes (Signed)
  Subjective:   Patient ID: Laurie Howell, female    DOB: March 20, 1976, 42 y.o.   MRN: [id number] CC: Vaginal Bleeding and Bloated  HPI: Laurie Howell is a 42 y.o. female   Heavy vaginal bleeding for the last 2 months.  She has had heavy bleeding off and on for the last 4 years since her tubal ligation.  The tubal ligation was reversed about 3 years ago.  She has had bleeding every day for the last 2 months.  Sometimes bleeding is heavy, she is not able to go outside the house because she has accidents.  Using over 20 pads or tampons a day.  Other days like today she is able to go 2 hours in between changing.  Constipation: sometimes going 3-4 weeks between bowel movements.  This is been ongoing for about 4 years, since she had her tubal ligation.  Her stomach feels quite bloated.  She has pain that goes across the front of her stomach on both sides she says feels like labor pains.  She sometimes only able to crawl to the bathroom.  Her appetite has been down.  But sometimes just wants to eat a sandwich in a day.  Relevant past medical, surgical, family and social history reviewed. Allergies and medications reviewed and updated. Social History   Tobacco Use  Smoking Status Former Smoker  . Years: 1.00  . Types: Cigarettes  . Last attempt to quit: 12/30/1994  . Years since quitting: 23.6  Smokeless Tobacco Never Used   ROS: Per HPI   Objective:    BP 106/80   Pulse (!) 109   Temp (!) 97 F (36.1 C) (Oral)   Ht _0  (1.575 m)   Wt 138 lb 9.6 oz (62.9 kg)   BMI 25.35 kg/m   Wt Readings from Last 3 Encounters:  08/26/18 138 lb 9.6 oz (62.9 kg)  04/07/18 133 lb 3.2 oz (60.4 kg)  03/17/18 134 lb 12.8 oz (61.1 kg)    Gen: NAD, alert, cooperative with exam, NCAT EYES: EOMI, no conjunctival injection, or no icterus CV: NRRR, normal S1/S2, no murmur, distal pulses 2+ b/l Resp: CTABL, no wheezes, normal WOB Abd: +BS, soft, mildly tender throughout with palpation. no guarding  Ext: No  edema, warm Neuro: Alert and oriented MSK: normal muscle bulk  Assessment & Plan:  Maygan was seen today for vaginal bleeding and bloated.  Diagnoses and all orders for this visit:  DUB (dysfunctional uterine bleeding) We will get labs, trial of hydroxyprogesterone.  Referral to gynecology. -     CBC with Differential/Platelet -     TSH -     BMP8+EGFR -     Ferritin -     DG Abd 1 View; Future -     medroxyPROGESTERone (PROVERA) 10 MG tablet; Take 1 tablet (10 mg total) by mouth daily. -     Ambulatory referral to Gynecology  Constipation, unspecified constipation type Stool burden present on KUB.  Days in between stools.  Discussed importance of cleaning out current stool burden and continuing on a maintenance bowel regimen goal 1-2 soft pudding consistency stools daily. -     DG Abd 1 View; Future  Bloating Likely from constipation as above. -     DG Abd 1 View; Future   Follow up plan: Return in about 3 weeks (around 09/16/2018).  Sooner if needed.  Return precautions discussed. Assunta Found, MD [place]

## 2018-08-27 LAB — CBC WITH DIFFERENTIAL/PLATELET
BASOS ABS: 0 10*3/uL (ref 0.0–0.2)
BASOS: 1 %
EOS (ABSOLUTE): 0.1 10*3/uL (ref 0.0–0.4)
Eos: 1 %
Hematocrit: 44.2 % (ref 34.0–46.6)
Hemoglobin: 14.3 g/dL (ref 11.1–15.9)
IMMATURE GRANS (ABS): 0 10*3/uL (ref 0.0–0.1)
IMMATURE GRANULOCYTES: 0 %
LYMPHS: 28 %
Lymphocytes Absolute: 2 10*3/uL (ref 0.7–3.1)
MCH: 31.4 pg (ref 26.6–33.0)
MCHC: 32.4 g/dL (ref 31.5–35.7)
MCV: 97 fL (ref 79–97)
Monocytes Absolute: 0.4 10*3/uL (ref 0.1–0.9)
Monocytes: 6 %
NEUTROS PCT: 64 %
Neutrophils Absolute: 4.6 10*3/uL (ref 1.4–7.0)
PLATELETS: 314 10*3/uL (ref 150–450)
RBC: 4.56 x10E6/uL (ref 3.77–5.28)
RDW: 13.6 % (ref 12.3–15.4)
WBC: 7.2 10*3/uL (ref 3.4–10.8)

## 2018-08-27 LAB — BMP8+EGFR
BUN/Creatinine Ratio: 13 (ref 9–23)
BUN: 9 mg/dL (ref 6–24)
CALCIUM: 9.7 mg/dL (ref 8.7–10.2)
CHLORIDE: 104 mmol/L (ref 96–106)
CO2: 22 mmol/L (ref 20–29)
Creatinine, Ser: 0.72 mg/dL (ref 0.57–1.00)
GFR, EST AFRICAN AMERICAN: 119 mL/min/{1.73_m2} (ref >59)
GFR, EST NON AFRICAN AMERICAN: 104 mL/min/{1.73_m2} (ref >59)
Glucose: 81 mg/dL (ref 65–99)
POTASSIUM: 4.5 mmol/L (ref 3.5–5.2)
Sodium: 140 mmol/L (ref 134–144)

## 2018-08-27 LAB — TSH: TSH: 0.643 u[IU]/mL (ref 0.450–4.500)

## 2018-08-27 LAB — FERRITIN: Ferritin: 44 ng/mL (ref 15–150)

## 2018-09-21 ENCOUNTER — Ambulatory Visit (INDEPENDENT_AMBULATORY_CARE_PROVIDER_SITE_OTHER): Payer: Medicaid Other | Admitting: *Deleted

## 2018-09-21 DIAGNOSIS — Z3042 Encounter for surveillance of injectable contraceptive: Secondary | ICD-10-CM

## 2018-09-28 ENCOUNTER — Encounter: Payer: Self-pay | Admitting: Pediatrics

## 2018-09-28 ENCOUNTER — Ambulatory Visit: Payer: Medicaid Other | Admitting: Pediatrics

## 2018-09-28 VITALS — BP 110/73 | HR 79 | Temp 97.9°F | Ht 62.0 in | Wt 138.6 lb

## 2018-09-28 DIAGNOSIS — R399 Unspecified symptoms and signs involving the genitourinary system: Secondary | ICD-10-CM

## 2018-09-28 DIAGNOSIS — N3090 Cystitis, unspecified without hematuria: Secondary | ICD-10-CM

## 2018-09-28 DIAGNOSIS — N938 Other specified abnormal uterine and vaginal bleeding: Secondary | ICD-10-CM | POA: Diagnosis not present

## 2018-09-28 DIAGNOSIS — F339 Major depressive disorder, recurrent, unspecified: Secondary | ICD-10-CM | POA: Diagnosis not present

## 2018-09-28 DIAGNOSIS — K5900 Constipation, unspecified: Secondary | ICD-10-CM | POA: Diagnosis not present

## 2018-09-28 DIAGNOSIS — Z23 Encounter for immunization: Secondary | ICD-10-CM

## 2018-09-28 LAB — URINALYSIS, COMPLETE
Bilirubin, UA: NEGATIVE
GLUCOSE, UA: NEGATIVE
Ketones, UA: NEGATIVE
Nitrite, UA: NEGATIVE
PH UA: 6.5 (ref 5.0–7.5)
PROTEIN UA: NEGATIVE
Specific Gravity, UA: 1.01 (ref 1.005–1.030)
Urobilinogen, Ur: 0.2 mg/dL (ref 0.2–1.0)

## 2018-09-28 LAB — MICROSCOPIC EXAMINATION
RBC MICROSCOPIC, UA: NONE SEEN /HPF (ref 0–2)
Yeast, UA: NONE SEEN

## 2018-09-28 MED ORDER — NITROFURANTOIN MONOHYD MACRO 100 MG PO CAPS: 100.0000 mg | ORAL_CAPSULE | Freq: Two times a day (BID) | ORAL | 0 refills | Status: AC

## 2018-09-28 MED ORDER — POLYETHYLENE GLYCOL 3350 17 GM/SCOOP PO POWD: 17.0000 g | Freq: Two times a day (BID) | ORAL | 3 refills | Status: DC | PRN

## 2018-09-28 MED ORDER — ESCITALOPRAM OXALATE 20 MG PO TABS: 20.0000 mg | ORAL_TABLET | Freq: Every day | ORAL | 3 refills | Status: DC

## 2018-09-28 NOTE — Patient Instructions (Signed)
Miralax 1-3 times daily as needed. Do not skip taking it. Goal 1-2 soft pudding consistency stools every day.   Can try linzess in the morning to replace first dose of miralax.

## 2018-09-28 NOTE — Progress Notes (Signed)
Subjective:   Patient ID: Laurie Howell, female    DOB: February 13, 1976, 42 y.o.   MRN: [id number] CC: Follow-up Constipation and multiple problems HPI: Laurie Howell is a 42 y.o. female   Depression: Symptoms improved when she is on the Lexapro.  She.  She has had a headache since that.  Would like to restart.  Constipation: At last visit 3 weeks ago she was sometimes going weeks between bowel movements.  Recommended doing a constipation cleanout with large amounts of MiraLAX.  She had fairly good stool output in the first week.  And started going every other day afterwards.  Last week just run a couple times, last stool was 3 days ago.  Still feeling full in her abdomen.  Takes MiraLAX and over-the-counter laxatives as needed,  DUB: Started on depoProvera.  Daily spotting slightly.  Not yet been called about a point with gynecology.  The past week ago started having urinary urgency, frequency.  Sometimes dysuria.  No fevers.  Abdomen feels full much of the time.  Relevant past medical, surgical, family and social history reviewed. Allergies and medications reviewed and updated. Social History   Tobacco Use  Smoking Status Former Smoker  . Years: 1.00  . Types: Cigarettes  . Last attempt to quit: 12/30/1994  . Years since quitting: 23.7  Smokeless Tobacco Never Used   ROS: Per HPI   Objective:    BP 110/73   Pulse 79   Temp 97.9 F (36.6 C) (Oral)   Ht 5\' 2"  (1.575 m)   Wt 138 lb 9.6 oz (62.9 kg)   BMI 25.35 kg/m   Wt Readings from Last 3 Encounters:  09/28/18 138 lb 9.6 oz (62.9 kg)  08/26/18 138 lb 9.6 oz (62.9 kg)  04/07/18 133 lb 3.2 oz (60.4 kg)    Gen: NAD, alert, cooperative with exam, NCAT EYES: EOMI, no conjunctival injection, or no icterus ENT: OP without erythema LYMPH: no cervical LAD CV: NRRR, normal S1/S2, no murmur, distal pulses 2+ b/l Resp: CTABL, no wheezes, normal WOB Abd: +BS, soft, mildly tender to deep palpation, ND. no guarding or  organomegaly Ext: No edema, warm Neuro: Alert and oriented MSK: normal muscle bulk  Assessment & Plan:  Laurie Howell was seen today for follow-up multiple problems.  Diagnoses and all orders for this visit:  Constipation, unspecified constipation type Symptoms returned when she stopped the MiraLAX.  Discussed would need to take a daily medicine to ensure she is having daily bowel rest for the next several months.  Give samples of Linzess 121mcg to try.  Also send in a prescription.  Also take MiraLAX at least once daily, may need to take it twice daily.  If no bowel within 24 hours needs to increase dose of MiraLAX. -     polyethylene glycol powder (GLYCOLAX/MIRALAX) powder; Take 17 g by mouth 2 (two) times daily as needed.  Need for immunization against influenza -     Flu Vaccine QUAD 36+ mos IM  DUB (dysfunctional uterine bleeding) Continues to have daily spotting.  Must be seen in [place]. -     Ambulatory referral to Obstetrics / Gynecology  UTI symptoms -     Urinalysis, Complete -     Urine Culture  Depression, recurrent (HCC) Symptoms stable on below, sent in prescription -     escitalopram (LEXAPRO) 20 MG tablet; Take 1 tablet (20 mg total) by mouth daily.  Cystitis Treat with nitrofurantoin, f/u urine culture  Follow up plan: Return  in about 2 months (around 11/28/2018). Assunta Found, MD [place]

## 2018-09-29 LAB — URINE CULTURE

## 2018-09-30 ENCOUNTER — Telehealth: Payer: Self-pay | Admitting: Pediatrics

## 2018-09-30 ENCOUNTER — Other Ambulatory Visit: Payer: Self-pay | Admitting: Pediatrics

## 2018-09-30 DIAGNOSIS — A5901 Trichomonal vulvovaginitis: Secondary | ICD-10-CM

## 2018-09-30 MED ORDER — METRONIDAZOLE 500 MG PO TABS: 2000.0000 mg | ORAL_TABLET | Freq: Once | ORAL | 0 refills | Status: AC

## 2018-09-30 NOTE — Telephone Encounter (Signed)
Aware, urine results copy is ready for her to pick up here.

## 2018-10-05 ENCOUNTER — Encounter: Payer: Self-pay | Admitting: *Deleted

## 2018-10-26 ENCOUNTER — Other Ambulatory Visit: Payer: Self-pay

## 2018-10-26 ENCOUNTER — Encounter (HOSPITAL_COMMUNITY): Payer: Self-pay | Admitting: Emergency Medicine

## 2018-10-26 ENCOUNTER — Emergency Department (HOSPITAL_COMMUNITY)
Admission: EM | Admit: 2018-10-26 | Discharge: 2018-10-27 | Disposition: A | Discharge status: Home / Self Care | Payer: Medicaid Other | Attending: Emergency Medicine | Admitting: Emergency Medicine

## 2018-10-26 DIAGNOSIS — R42 Dizziness and giddiness: Secondary | ICD-10-CM | POA: Diagnosis not present

## 2018-10-26 DIAGNOSIS — E876 Hypokalemia: Secondary | ICD-10-CM

## 2018-10-26 DIAGNOSIS — Z87891 Personal history of nicotine dependence: Secondary | ICD-10-CM | POA: Diagnosis not present

## 2018-10-26 DIAGNOSIS — N939 Abnormal uterine and vaginal bleeding, unspecified: Secondary | ICD-10-CM | POA: Insufficient documentation

## 2018-10-26 DIAGNOSIS — R102 Pelvic and perineal pain: Secondary | ICD-10-CM | POA: Insufficient documentation

## 2018-10-26 DIAGNOSIS — N938 Other specified abnormal uterine and vaginal bleeding: Secondary | ICD-10-CM

## 2018-10-26 DIAGNOSIS — Z79899 Other long term (current) drug therapy: Secondary | ICD-10-CM | POA: Insufficient documentation

## 2018-10-26 LAB — COMPREHENSIVE METABOLIC PANEL
ALT: 30 U/L (ref 0–44)
AST: 30 U/L (ref 15–41)
Albumin: 4.4 g/dL (ref 3.5–5.0)
Alkaline Phosphatase: 90 U/L (ref 38–126)
Anion gap: 11 (ref 5–15)
BUN: 6 mg/dL (ref 6–20)
CHLORIDE: 104 mmol/L (ref 98–111)
CO2: 21 mmol/L — AB (ref 22–32)
Calcium: 8.9 mg/dL (ref 8.9–10.3)
Creatinine, Ser: 0.78 mg/dL (ref 0.44–1.00)
GFR calc Af Amer: >60 mL/min (ref >60)
Glucose, Bld: 95 mg/dL (ref 70–99)
POTASSIUM: 2.9 mmol/L — AB (ref 3.5–5.1)
Sodium: 136 mmol/L (ref 135–145)
Total Bilirubin: 0.5 mg/dL (ref 0.3–1.2)
Total Protein: 8.2 g/dL — ABNORMAL HIGH (ref 6.5–8.1)

## 2018-10-26 LAB — CBC
HCT: 45.4 % (ref 36.0–46.0)
HEMOGLOBIN: 14.6 g/dL (ref 12.0–15.0)
MCH: 31.1 pg (ref 26.0–34.0)
MCHC: 32.2 g/dL (ref 30.0–36.0)
MCV: 96.6 fL (ref 80.0–100.0)
Platelets: 221 10*3/uL (ref 150–400)
RBC: 4.7 MIL/uL (ref 3.87–5.11)
RDW: 12 % (ref 11.5–15.5)
WBC: 7.2 10*3/uL (ref 4.0–10.5)
nRBC: 0 % (ref 0.0–0.2)

## 2018-10-26 LAB — URINALYSIS, ROUTINE W REFLEX MICROSCOPIC
BACTERIA UA: NONE SEEN
BILIRUBIN URINE: NEGATIVE
Glucose, UA: NEGATIVE mg/dL
Ketones, ur: 20 mg/dL — AB
NITRITE: NEGATIVE
Protein, ur: NEGATIVE mg/dL
Specific Gravity, Urine: 1.018 (ref 1.005–1.030)
pH: 5 (ref 5.0–8.0)

## 2018-10-26 LAB — HCG, QUANTITATIVE, PREGNANCY

## 2018-10-26 LAB — LIPASE, BLOOD: Lipase: 31 U/L (ref 11–51)

## 2018-10-26 MED ORDER — POTASSIUM CHLORIDE 10 MEQ/100ML IV SOLN
10.0000 meq | Freq: Once | INTRAVENOUS | Status: AC
  Administered 2018-10-26: 10 meq via INTRAVENOUS
  Filled 2018-10-26: qty 100

## 2018-10-26 MED ORDER — POTASSIUM CHLORIDE CRYS ER 20 MEQ PO TBCR
40.0000 meq | EXTENDED_RELEASE_TABLET | Freq: Once | ORAL | Status: AC
  Administered 2018-10-26: 40 meq via ORAL
  Filled 2018-10-26: qty 2

## 2018-10-26 MED ORDER — KETOROLAC TROMETHAMINE 30 MG/ML IJ SOLN
30.0000 mg | Freq: Once | INTRAMUSCULAR | Status: AC
  Administered 2018-10-26: 30 mg via INTRAVENOUS
  Filled 2018-10-26: qty 1

## 2018-10-26 MED ORDER — SODIUM CHLORIDE 0.9 % IV BOLUS
1000.0000 mL | Freq: Once | INTRAVENOUS | Status: AC
  Administered 2018-10-26: 1000 mL via INTRAVENOUS

## 2018-10-26 NOTE — ED Triage Notes (Signed)
Pt c/o heavy vaginal bleeding with large clots since Friday, also c/o severe abd pain, "feels like my whole body is on fire", and weakness

## 2018-10-26 NOTE — ED Notes (Signed)
Pt states she has not had the Depo shot in 2 months.

## 2018-10-26 NOTE — ED Notes (Signed)
Pt reports she feels like her abd is burning, diffuse body cramping, and has used over 50 pads today. States she is passing softball sized blood clots. Denies possibility of pregnancy. Has been having vaginal bleeding since Saturday.

## 2018-10-26 NOTE — ED Provider Notes (Signed)
[facility] EMERGENCY DEPARTMENT Provider Note   CSN: [id number] Arrival date & time: 10/26/18  1918     History   Chief Complaint Chief Complaint  Patient presents with  . Vaginal Bleeding    HPI Laurie Howell is a 42 y.o. female.  HPI  This is a 41 year old female with history of iron deficiency anemia, fibroids who presents with vaginal bleeding and body aches.  Patient reports that she had been on Depo-Provera for birth control.  However, "my body got used to it and I have significant irregular bleeding."  Patient reports that she has been having vaginal bleeding for over 4 weeks.  It has been worse over the last 2 to 3 days including passage of clots.  She reports dizziness.  No chest pain or shortness of breath.  She reports abdominal cramping and backache.  Patient also reports generalized body aches.  She states that her legs hurt "so bad that I cannot sleep."  She reports that she has taken multiple medications including Tylenol and anti-inflammatories with little relief.  Rates her pain at 8 out of 10.  Past Medical History:  Diagnosis Date  . Anemia   . Esophageal stricture   . Fibroid 06/18/2017  . Frequent UTI   . GERD (gastroesophageal reflux disease)   . Headache   . Iron deficiency anemia 03/28/2016    Patient Active Problem List   Diagnosis Date Noted  . Nerve pain 08/12/2017  . Fibroid 06/18/2017  . Abdominal bloating 06/03/2017  . Depression 06/03/2017  . DUB (dysfunctional uterine bleeding) 06/03/2017  . LLQ pain 06/03/2017  . Menorrhagia with regular cycle 06/03/2017  . Anovulation 01/23/2017  . Hyperthyroidism 01/23/2017  . Gastroesophageal reflux disease with esophagitis   . Dysphagia 06/20/2016  . Iron deficiency anemia 03/28/2016  . 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 Intractable migraine without aura 01/14/2014  . Daily headache 01/14/2014    Past Surgical History:  Procedure Laterality Date  . ESOPHAGOGASTRODUODENOSCOPY N/A  06/27/2016   Procedure: ESOPHAGOGASTRODUODENOSCOPY (EGD);  Surgeon: Danie Binder, MD;  Location: [facility];  Service: Endoscopy;  Laterality: N/A;  1245  . SAVORY DILATION N/A 06/27/2016   Procedure: SAVORY DILATION;  Surgeon: Danie Binder, MD;  Location: [facility] ENDO SUITE;  Service: Endoscopy;  Laterality: N/A;  . TUBAL LIGATION  1997  . Tubal reversal  02/2015   [facility] - Dr Karie Kirks     OB History    Gravida  6   Para  2   Term  2   Preterm      AB  4   Living  2     SAB  4   TAB      Ectopic      Multiple      Live Births  2            Home Medications    Prior to Admission medications   Medication Sig Start Date End Date Taking? Authorizing Provider  escitalopram (LEXAPRO) 20 MG tablet Take 1 tablet (20 mg total) by mouth daily. 09/28/18   Eustaquio Maize, MD  medroxyPROGESTERone (DEPO-PROVERA) 150 MG/ML injection Inject 1 mL (150 mg total) into the muscle every 3 (three) months. 04/07/18   Timmothy Euler, MD  medroxyPROGESTERone (PROVERA) 10 MG tablet Take 1 tablet (10 mg total) by mouth daily. 08/26/18   Eustaquio Maize, MD  polyethylene glycol powder (GLYCOLAX/MIRALAX) powder Take 17 g by mouth 2 (two) times daily as needed.  09/28/18   Eustaquio Maize, MD  potassium chloride SA (K-DUR,KLOR-CON) 20 MEQ tablet Take 1 tablet (20 mEq total) by mouth 2 (two) times daily. 10/27/18   Harith Mccadden, Barbette Hair, MD    Family History Family History  Problem Relation Age of Onset  . Depression Mother   . Hypertension Mother   . Other Mother        open heart surgery  . Heart disease Father   . Hypertension Father   . Anxiety disorder Father   . Heart attack Father   . Stroke Maternal Grandmother   . Colon cancer Neg Hx     Social History Social History   Tobacco Use  . Smoking status: Former Smoker    Years: 1.00    Types: Cigarettes    Last attempt to quit: 12/30/1994    Years since quitting: 23.8  . Smokeless tobacco: Never Used  Substance Use Topics    . Alcohol use: No  . Drug use: No     Allergies   Ivp dye [iodinated diagnostic agents]; Shellfish allergy; and Aspirin   Review of Systems Review of Systems  Constitutional: Negative for fever.  Respiratory: Negative for shortness of breath.   Cardiovascular: Negative for chest pain.  Gastrointestinal: Positive for abdominal pain. Negative for constipation, diarrhea, nausea and vomiting.  Genitourinary: Positive for vaginal bleeding. Negative for dysuria and hematuria.  Musculoskeletal: Positive for back pain.  Neurological: Positive for dizziness.  All other systems reviewed and are negative.    Physical Exam Updated Vital Signs BP 121/83   Pulse (!) 105   Temp 99.7 F (37.6 C) (Oral)   Resp 14   Ht 1.575 m (5\' 2" )   Wt 60.3 kg   LMP 10/26/2018   SpO2 100%   BMI 24.33 kg/m   Physical Exam  Constitutional: She is oriented to person, place, and time. She appears well-developed and well-nourished. No distress.  HENT:  Head: Normocephalic and atraumatic.  Eyes: Pupils are equal, round, and reactive to light.  Neck: Neck supple.  Cardiovascular: Normal rate, regular rhythm and normal heart sounds.  No murmur heard. Pulmonary/Chest: Effort normal and breath sounds normal. No respiratory distress. She has no wheezes.  Abdominal: Soft. Bowel sounds are normal. There is no tenderness. There is no rebound and no guarding.  Musculoskeletal: She exhibits no edema.  Neurological: She is alert and oriented to person, place, and time.  Skin: Skin is warm and dry.  Psychiatric: She has a normal mood and affect.  Nursing note and vitals reviewed.    ED Treatments / Results  Labs (all labs ordered are listed, but only abnormal results are displayed) Labs Reviewed  COMPREHENSIVE METABOLIC PANEL - Abnormal; Notable for the following components:      Result Value   Potassium 2.9 (*)    CO2 21 (*)    Total Protein 8.2 (*)    All other components within normal limits   URINALYSIS, ROUTINE W REFLEX MICROSCOPIC - Abnormal; Notable for the following components:   APPearance HAZY (*)    Hgb urine dipstick LARGE (*)    Ketones, ur 20 (*)    Leukocytes, UA TRACE (*)    All other components within normal limits  HCG, QUANTITATIVE, PREGNANCY  LIPASE, BLOOD  CBC    EKG None  Radiology No results found.  Procedures Procedures (including critical care time)  Medications Ordered in ED Medications  sodium chloride 0.9 % bolus 1,000 mL (0 mLs Intravenous Stopped 10/27/18 0037)  potassium chloride SA (K-DUR,KLOR-CON) CR tablet 40 mEq (40 mEq Oral Given 10/26/18 2329)  potassium chloride 10 mEq in 100 mL IVPB (0 mEq Intravenous Stopped 10/27/18 0037)  ketorolac (TORADOL) 30 MG/ML injection 30 mg (30 mg Intravenous Given 10/26/18 2340)     Initial Impression / Assessment and Plan / ED Course  I have reviewed the triage vital signs and the nursing notes.  Pertinent labs & imaging results that were available during my care of the patient were reviewed by me and considered in my medical decision making (see chart for details).     She presents with dysfunctional uterine bleeding and general has body aches.  She is overall nontoxic-appearing.  Mildly tachycardic.  Abdominal exam is benign.  Lab work reviewed.  Hemoglobin is normal at 14.6.  No signs of significant anemia.  She does have a potassium of 2.9.  This could certainly cause some myalgias and body aches.  Patient was given fluids, IV potassium and oral potassium.  She is not pregnant.  Urinalysis is reassuring.  Given benign abdominal exam, do not feel that a pelvic exam would add to the patient's work-up.  On recheck, she states she feels better after fluids and potassium supplementation.  Recommend follow-up with her primary physician in 1 week for potassium recheck.  Will send home on potassium supplementation.  Additionally she needs to follow-up with OB/GYN regarding dysfunctional bleeding.  After  history, exam, and medical workup I feel the patient has been appropriately medically screened and is safe for discharge home. Pertinent diagnoses were discussed with the patient. Patient was given return precautions.   Final Clinical Impressions(s) / ED Diagnoses   Final diagnoses:  Hypokalemia  Dysfunctional uterine bleeding    ED Discharge Orders         Ordered    potassium chloride SA (K-DUR,KLOR-CON) 20 MEQ tablet  2 times daily     10/27/18 0036           Shalee Paolo, Barbette Hair, MD 10/27/18 [phone]

## 2018-10-27 MED ORDER — POTASSIUM CHLORIDE CRYS ER 20 MEQ PO TBCR: 20.0000 meq | EXTENDED_RELEASE_TABLET | Freq: Two times a day (BID) | ORAL | 0 refills | Status: DC

## 2018-10-29 ENCOUNTER — Other Ambulatory Visit (HOSPITAL_COMMUNITY)
Admission: RE | Admit: 2018-10-29 | Discharge: 2018-10-29 | Disposition: A | Discharge status: Home / Self Care | Payer: Medicaid Other | Source: Ambulatory Visit | Attending: Obstetrics & Gynecology | Admitting: Obstetrics & Gynecology

## 2018-10-29 ENCOUNTER — Ambulatory Visit: Payer: Medicaid Other | Admitting: Women's Health

## 2018-10-29 ENCOUNTER — Encounter: Payer: Self-pay | Admitting: Women's Health

## 2018-10-29 VITALS — BP 107/81 | HR 90 | Ht 62.0 in | Wt 139.0 lb

## 2018-10-29 DIAGNOSIS — N921 Excessive and frequent menstruation with irregular cycle: Secondary | ICD-10-CM | POA: Diagnosis not present

## 2018-10-29 DIAGNOSIS — N72 Inflammatory disease of cervix uteri: Secondary | ICD-10-CM | POA: Diagnosis not present

## 2018-10-29 DIAGNOSIS — N889 Noninflammatory disorder of cervix uteri, unspecified: Secondary | ICD-10-CM

## 2018-10-29 DIAGNOSIS — N760 Acute vaginitis: Secondary | ICD-10-CM | POA: Diagnosis not present

## 2018-10-29 DIAGNOSIS — B9689 Other specified bacterial agents as the cause of diseases classified elsewhere: Secondary | ICD-10-CM

## 2018-10-29 LAB — POCT WET PREP (WET MOUNT)
Clue Cells Wet Prep Whiff POC: POSITIVE
TRICHOMONAS WET PREP HPF POC: ABSENT

## 2018-10-29 MED ORDER — METRONIDAZOLE 500 MG PO TABS: 500.0000 mg | ORAL_TABLET | Freq: Two times a day (BID) | ORAL | 0 refills | Status: DC

## 2018-10-29 MED ORDER — MEGESTROL ACETATE 40 MG PO TABS: ORAL_TABLET | ORAL | 1 refills | Status: DC

## 2018-10-29 NOTE — Progress Notes (Signed)
GYN VISIT Patient name: Laurie Howell MRN [id number]  Date of birth: [birth year] Chief Complaint:   vaginal bleeding x 4 weeks (depo)  History of Present Illness:   Laurie Howell is a 42 y.o. [id number] Caucasian female being seen today for f/u ED visit 10/28 for heavy periods. Hgb was 14.6. Reports irregular periods x 29yrs since having her last son. Got BTL reversed 71yrs ago, but did not conceive. PCP put her on depo 21mths ago to try to help w/ bleeding- pt states it helped for first 2 mths, then stopped helping. Bleeds x 1-3 months at a time, may stop for a week or so, then starts right back. Changes saturated super tampon q 30-46mins at the heaviest. Soils clothes often. Grapefruit-sized clots. +cramping.  Has known fibroid, states was discovered about 1 yr ago. Review of chart reveals pelvic u/s done 06/17/17 at our office: 8x8x2.64mm intramural fibroid mid/left uterus.  Patient's last menstrual period was 10/26/2018. The current method of family planning is Depo-Provera injections. Last pap reports 92mths ago w/ [facility]. Results were:  normal, last pap I see in Epic is 03/28/16 and was neg, HPV not done Review of Systems:   Pertinent items are noted in HPI Denies fever/chills, dizziness, headaches, visual disturbances, fatigue, shortness of breath, chest pain, abdominal pain, vomiting, abnormal vaginal discharge/itching/odor/irritation, problems with periods, bowel movements, urination, or intercourse unless otherwise stated above.  Pertinent History Reviewed:  Reviewed past medical,surgical, social, obstetrical and family history.  Reviewed problem list, medications and allergies. Physical Assessment:   Vitals:   10/29/18 1110  BP: 107/81  Pulse: 90  Weight: 139 lb (63 kg)  Height: 5\' 2"  (1.575 m)  Body mass index is 25.42 kg/m.       Physical Examination:   General appearance: alert, well appearing, and in no distress  Mental status: alert, oriented to person, place, and time  Skin:  warm & dry   Cardiovascular: normal heart rate noted  Respiratory: normal respiratory effort, no distress  Abdomen: soft, non-tender   Pelvic: VULVA: normal appearing vulva with no masses, tenderness or lesions, VAGINA: normal appearing vagina with normal color and malodorous menstrual discharge, no lesions, CERVIX: tenderness, os appears hypopigmented/white, fleshy/bumpy appearance, cx speckled w/ hypopigmented/white appearance, co-exam w/ [facility] cx biopsy obtained- mod amt bleeding, Monsels applied w/ hemostasis, UTERUS: uterus is normal size, shape, consistency and is tender, ADNEXA: normal adnexa in size, tender bilateral, and no masses  Extremities: no edema   Results for orders placed or performed in visit on 10/29/18 (from the past 24 hour(s))  POCT Wet Prep [facility])   Collection Time: 10/29/18 11:56 AM  Result Value Ref Range   Source Wet Prep POC vaginal    WBC, Wet Prep HPF POC few    Bacteria Wet Prep HPF POC Few Few   BACTERIA WET PREP MORPHOLOGY POC     Clue Cells Wet Prep HPF POC Many (A) None   Clue Cells Wet Prep Whiff POC Positive Whiff    Yeast Wet Prep HPF POC None None   KOH Wet Prep POC     Trichomonas Wet Prep HPF POC Absent Absent    Assessment & Plan:  1) Menorrhagia w/ irregular cycle> currently on depo, per [facility] rx megace algorithm, schedule for pelvic u/s in 28mth and f/u w/ him  2) Abnormal appearance of cx> biopsy obtained, no sex x 3wks  3) BV> Rx metronidazole 500mg  BID x 7d for BV, no sex or etoh while taking  Meds:  Meds ordered this encounter  Medications  . megestrol (MEGACE) 40 MG tablet    Sig: 3x5d, 2x5d, then 1 daily to help control vaginal bleeding.    Dispense:  45 tablet    Refill:  1    Order Specific Question:   Supervising Provider    Answer:   Elonda Husky, LUTHER H [[id number]]  . metroNIDAZOLE (FLAGYL) 500 MG tablet    Sig: Take 1 tablet (500 mg total) by mouth 2 (two) times daily.    Dispense:  14 tablet    Refill:  0    Order Specific  Question:   Supervising Provider    Answer:   Tania Ade H [[id number]]    Orders Placed This Encounter  Procedures  . GC/Chlamydia Probe Amp  . US PELVIS (TRANSABDOMINAL ONLY)  . US PELVIS TRANSVANGINAL NON-OB (TV ONLY)  . POCT Wet Prep [employer])  . PR BIOPSY CERVIX, 1 OR MORE, OR EXCISION OF LESION    Return in about 1 month (around 11/28/2018) for US:GYN and f/u w/ [facility] after; get pap records from [place], [facility] 10/29/2018 11:57 AM

## 2018-10-29 NOTE — Addendum Note (Signed)
Addended by: Diona Fanti A on: 10/29/2018 01:31 PM   Modules accepted: Orders

## 2018-11-02 ENCOUNTER — Ambulatory Visit: Payer: Medicaid Other | Admitting: Family Medicine

## 2018-11-02 ENCOUNTER — Encounter: Payer: Self-pay | Admitting: Family Medicine

## 2018-11-02 VITALS — BP 102/75 | HR 105 | Temp 97.8°F | Ht 62.0 in | Wt 140.0 lb

## 2018-11-02 DIAGNOSIS — E876 Hypokalemia: Secondary | ICD-10-CM

## 2018-11-02 DIAGNOSIS — N938 Other specified abnormal uterine and vaginal bleeding: Secondary | ICD-10-CM | POA: Diagnosis not present

## 2018-11-02 DIAGNOSIS — Z09 Encounter for follow-up examination after completed treatment for conditions other than malignant neoplasm: Secondary | ICD-10-CM

## 2018-11-02 NOTE — Patient Instructions (Addendum)
Dysfunctional Uterine Bleeding Dysfunctional uterine bleeding is abnormal bleeding from the uterus. Dysfunctional uterine bleeding includes:  A period that comes earlier or later than usual.  A period that is lighter, heavier, or has blood clots.  Bleeding between periods.  Skipping one or more periods.  Bleeding after sexual intercourse.  Bleeding after menopause.  Follow these instructions at home: Pay attention to any changes in your symptoms. Follow these instructions to help with your condition: Eating and drinking  Eat well-balanced meals. Include foods that are high in iron, such as liver, meat, shellfish, green leafy vegetables, and eggs.  If you become constipated: ? Drink plenty of water. ? Eat fruits and vegetables that are high in water and fiber, such as spinach, carrots, raspberries, apples, and mango. Medicines  Take over-the-counter and prescription medicines only as told by your health care provider.  Do not change medicines without talking with your health care provider.  Aspirin or medicines that contain aspirin may make the bleeding worse. Do not take those medicines: ? During the week before your period. ? During your period.  If you were prescribed iron pills, take them as told by your health care provider. Iron pills help to replace iron that your body loses because of this condition. Activity  If you need to change your sanitary pad or tampon more than one time every 2 hours: ? Lie in bed with your feet raised (elevated). ? Place a cold pack on your lower abdomen. ? Rest as much as possible until the bleeding stops or slows down.  Do not try to lose weight until the bleeding has stopped and your blood iron level is back to normal. Other Instructions  For two months, write down: ? When your period starts. ? When your period ends. ? When any abnormal bleeding occurs. ? What problems you notice.  Keep all follow up visits as told by your health  care provider. This is important. Contact a health care provider if:  You get light-headed or weak.  You have nausea and vomiting.  You cannot eat or drink without vomiting.  You feel dizzy or have diarrhea while you are taking medicines.  You are taking birth control pills or hormones, and you want to change them or stop taking them. Get help right away if:  You develop a fever or chills.  You need to change your sanitary pad or tampon more than one time per hour.  Your bleeding becomes heavier, or your flow contains clots more often.  You develop pain in your abdomen.  You lose consciousness.  You develop a rash. This information is not intended to replace advice given to you by your health care provider. Make sure you discuss any questions you have with your health care provider. Document Released: 12/13/2000 Document Revised: 05/23/2016 Document Reviewed: 03/13/2015 Elsevier Interactive Patient Education  2018 [employer]. Hypokalemia Hypokalemia means that the amount of potassium in the blood is lower than normal.Potassium is a chemical that helps regulate the amount of fluid in the body (electrolyte). It also stimulates muscle tightening (contraction) and helps nerves work properly.Normally, most of the body's potassium is inside of cells, and only a very small amount is in the blood. Because the amount in the blood is so small, minor changes to potassium levels in the blood can be life-threatening. What are the causes? This condition may be caused by:  Antibiotic medicine.  Diarrhea or vomiting. Taking too much of a medicine that helps you have a  bowel movement (laxative) can cause diarrhea and lead to hypokalemia.  Chronic kidney disease (CKD).  Medicines that help the body get rid of excess fluid (diuretics).  Eating disorders, such as bulimia.  Low magnesium levels in the body.  Sweating a lot.  What are the signs or symptoms? Symptoms of this condition  include:  Weakness.  Constipation.  Fatigue.  Muscle cramps.  Mental confusion.  Skipped heartbeats or irregular heartbeat (palpitations).  Tingling or numbness.  How is this diagnosed? This condition is diagnosed with a blood test. How is this treated? Hypokalemia can be treated by taking potassium supplements by mouth or adjusting the medicines that you take. Treatment may also include eating more foods that contain a lot of potassium. If your potassium level is very low, you may need to get potassium through an IV tube in one of your veins and be monitored in the hospital. Follow these instructions at home:  Take over-the-counter and prescription medicines only as told by your health care provider. This includes vitamins and supplements.  Eat a healthy diet. A healthy diet includes fresh fruits and vegetables, whole grains, healthy fats, and lean proteins.  If instructed, eat more foods that contain a lot of potassium, such as: ? Nuts, such as peanuts and pistachios. ? Seeds, such as sunflower seeds and pumpkin seeds. ? Peas, lentils, and lima beans. ? Whole grain and bran cereals and breads. ? Fresh fruits and vegetables, such as apricots, avocado, bananas, cantaloupe, kiwi, oranges, tomatoes, asparagus, and potatoes. ? Orange juice. ? Tomato juice. ? Red meats. ? Yogurt.  Keep all follow-up visits as told by your health care provider. This is important. Contact a health care provider if:  You have weakness that gets worse.  You feel your heart pounding or racing.  You vomit.  You have diarrhea.  You have diabetes (diabetes mellitus) and you have trouble keeping your blood sugar (glucose) in your target range. Get help right away if:  You have chest pain.  You have shortness of breath.  You have vomiting or diarrhea that lasts for more than 2 days.  You faint. This information is not intended to replace advice given to you by your health care provider.  Make sure you discuss any questions you have with your health care provider. Document Released: 12/16/2005 Document Revised: 08/03/2016 Document Reviewed: 08/03/2016 Elsevier Interactive Patient Education  2018 [employer].

## 2018-11-02 NOTE — Progress Notes (Signed)
Subjective:    Patient ID: Laurie Howell, female    DOB: February 14, 1976, 42 y.o.   MRN: [id number]  Chief Complaint:  Hospitalization Follow-up   HPI: Laurie Howell is a 42 y.o. female presenting on 11/02/2018 for Hospitalization Follow-up  Pt was seen in the ED at [facility] on 10-26-18 for vaginal bleeding and muscle cramps. Pt was noted to be hypokalemic at 2.9, pt was given oral and IV potassium during the ED stay. Pt was sent home on K-Dur 20 meq BID. Pt states she completed this yesterday. States she continues to have bilateral lower extremity muscle cramps. States they are intermittent. Pt states she followed up with GYN for her dysfunctional uterine bleeding on 10-29-18. States a cervical biopsy was completed and an [place] was ordered. Pt states she continues to have heavy vaginal bleeding with clots. States she is soaking at least 8 super tampons per hour. States she has intermittent dizziness and fatigue.  Relevant past medical, surgical, family, and social history reviewed and updated as indicated.  Allergies and medications reviewed and updated.   Past Medical History:  Diagnosis Date  . Anemia   . Esophageal stricture   . Fibroid 06/18/2017  . Frequent UTI   . GERD (gastroesophageal reflux disease)   . Headache   . Iron deficiency anemia 03/28/2016    Past Surgical History:  Procedure Laterality Date  . ESOPHAGOGASTRODUODENOSCOPY N/A 06/27/2016   Procedure: ESOPHAGOGASTRODUODENOSCOPY (EGD);  Surgeon: Danie Binder, MD;  [facility];  Service: Endoscopy;  Laterality: N/A;  1245  . SAVORY DILATION N/A 06/27/2016   Procedure: SAVORY DILATION;  Surgeon: Danie Binder, MD;  Location: [facility];  Service: Endoscopy;  Laterality: N/A;  . TUBAL LIGATION  1997  . Tubal reversal  02/2015   [facility] - Dr Karie Kirks    Social History   Socioeconomic History  . Marital status: Legally Separated    Spouse name: Not on file  . Number of children: 2  . Years of  education: 8th  . Highest education level: Not on file  Occupational History  . Occupation: N/A  Social Needs  . Financial resource strain: Not on file  . Food insecurity:    Worry: Not on file    Inability: Not on file  . Transportation needs:    Medical: Not on file    Non-medical: Not on file  Tobacco Use  . Smoking status: Former Smoker    Years: 1.00    Types: Cigarettes    Last attempt to quit: 12/30/1994    Years since quitting: 23.8  . Smokeless tobacco: Never Used  Substance and Sexual Activity  . Alcohol use: No  . Drug use: No  . Sexual activity: Yes    Birth control/protection: Injection    Comment: tubal reversed  Lifestyle  . Physical activity:    Days per week: Not on file    Minutes per session: Not on file  . Stress: Not on file  Relationships  . Social connections:    Talks on phone: Not on file    Gets together: Not on file    Attends religious service: Not on file    Active member of club or organization: Not on file    Attends meetings of clubs or organizations: Not on file    Relationship status: Not on file  . Intimate partner violence:    Fear of current or ex partner: Not on file  Emotionally abused: Not on file    Physically abused: Not on file    Forced sexual activity: Not on file  Other Topics Concern  . Not on file  Social History Narrative  . Not on file    Outpatient Encounter Medications as of 11/02/2018  Medication Sig  . escitalopram (LEXAPRO) 20 MG tablet Take 1 tablet (20 mg total) by mouth daily.  Marland Kitchen linaclotide (LINZESS) 145 MCG CAPS capsule Take 145 mcg by mouth daily before breakfast.  . medroxyPROGESTERone (DEPO-PROVERA) 150 MG/ML injection Inject 1 mL (150 mg total) into the muscle every 3 (three) months.  . megestrol (MEGACE) 40 MG tablet 3x5d, 2x5d, then 1 daily to help control vaginal bleeding.  . metroNIDAZOLE (FLAGYL) 500 MG tablet Take 1 tablet (500 mg total) by mouth 2 (two) times daily.  . potassium chloride SA  (K-DUR,KLOR-CON) 20 MEQ tablet Take 1 tablet (20 mEq total) by mouth 2 (two) times daily.  . [DISCONTINUED] medroxyPROGESTERone (PROVERA) 10 MG tablet Take 1 tablet (10 mg total) by mouth daily. (Patient not taking: Reported on 10/29/2018)  . [DISCONTINUED] polyethylene glycol powder (GLYCOLAX/MIRALAX) powder Take 17 g by mouth 2 (two) times daily as needed.   Facility-Administered Encounter Medications as of 11/02/2018  Medication  . medroxyPROGESTERone (DEPO-PROVERA) injection 150 mg    Allergies  Allergen Reactions  . Ivp Dye [Iodinated Diagnostic Agents] Anaphylaxis  . Shellfish Allergy Anaphylaxis  . Aspirin Swelling and Other (See Comments)    Reaction:  Facial swelling     Review of Systems  Constitutional: Positive for activity change and fatigue. Negative for chills and fever.  Respiratory: Negative for chest tightness and shortness of breath.   Cardiovascular: Negative for chest pain, palpitations and leg swelling.  Gastrointestinal: Negative for abdominal pain, blood in stool, constipation, diarrhea, nausea and vomiting.  Genitourinary: Positive for vaginal bleeding. Negative for flank pain, pelvic pain, vaginal discharge and vaginal pain.  Musculoskeletal: Positive for myalgias (muscle cramps, bilateral lower extremities. ). Negative for arthralgias and back pain.  Skin: Negative for color change.  Neurological: Positive for dizziness and weakness. Negative for light-headedness, numbness and headaches.  All other systems reviewed and are negative.       Objective:    BP 102/75   Pulse (!) 105   Temp 97.8 F (36.6 C) (Oral)   Ht '5\' 2"'  (1.575 m)   Wt 140 lb (63.5 kg)   LMP 10/26/2018   BMI 25.61 kg/m    Wt Readings from Last 3 Encounters:  11/02/18 140 lb (63.5 kg)  10/29/18 139 lb (63 kg)  10/26/18 133 lb (60.3 kg)    Physical Exam  Constitutional: She is oriented to person, place, and time. She appears well-developed and well-nourished. She is cooperative.  No distress.  HENT:  Head: Normocephalic.  Nose: Nose normal.  Mouth/Throat: Uvula is midline, oropharynx is clear and moist and mucous membranes are normal. Mucous membranes are not pale. No tonsillar exudate.  Eyes: Pupils are equal, round, and reactive to light. Conjunctivae, EOM and lids are normal.  Neck: Normal range of motion. Neck supple. No JVD present.  Cardiovascular: Normal rate, regular rhythm, normal heart sounds and intact distal pulses. Exam reveals no gallop and no friction rub.  No murmur heard. Pulmonary/Chest: Effort normal and breath sounds normal. No respiratory distress.  Abdominal: Soft. Bowel sounds are normal. There is no tenderness.  Genitourinary:  Genitourinary Comments: Recent pelvic exam with cervical biopsy. Pelvic US ordered by GYN. Did not repeat pelvic exam today.  Musculoskeletal: Normal range of motion.  Neurological: She is alert and oriented to person, place, and time. She has normal strength. She displays normal reflexes. No cranial nerve deficit or sensory deficit. She exhibits normal muscle tone. Coordination and gait normal.  Skin: Skin is warm and dry. Capillary refill takes less than 2 seconds. No pallor.  Psychiatric: She has a normal mood and affect. Her speech is normal and behavior is normal. Judgment and thought content normal. Cognition and memory are normal.  Nursing note and vitals reviewed.   Results for orders placed or performed in visit on 10/29/18  POCT Wet Prep [facility])  Result Value Ref Range   Source Wet Prep POC vaginal    WBC, Wet Prep HPF POC few    Bacteria Wet Prep HPF POC Few Few   BACTERIA WET PREP MORPHOLOGY POC     Clue Cells Wet Prep HPF POC Many (A) None   Clue Cells Wet Prep Whiff POC Positive Whiff    Yeast Wet Prep HPF POC None None   KOH Wet Prep POC     Trichomonas Wet Prep HPF POC Absent Absent       Pertinent labs & imaging results that were available during my care of the patient were reviewed by me  and considered in my medical decision making.  Assessment & Plan:  Diagnoses and all orders for this visit:  Hospital discharge follow-up  Hypokalemia Potassium rich diet discussed.  -     BMP8+EGFR  Dysfunctional uterine bleeding Keep follow up appointment with GYN, [place] scheduled with GYN. -     CBC with Differential/Platelet      Continue all other maintenance medications.  Follow up plan: Return in about 4 weeks (around 11/30/2018), or if symptoms worsen or fail to improve.  Educational handout given for DUB, hypokalemia  The above assessment and management plan was discussed with the patient. The patient verbalized understanding of and has agreed to the management plan. Patient is aware to call the clinic if symptoms persist or worsen. Patient is aware when to return to the clinic for a follow-up visit. Patient educated on when it is appropriate to go to the emergency department.   Monia Pouch, FNP-C [facility] [phone]

## 2018-11-03 LAB — CBC WITH DIFFERENTIAL/PLATELET
BASOS: 1 %
Basophils Absolute: 0.1 10*3/uL (ref 0.0–0.2)
EOS (ABSOLUTE): 0.1 10*3/uL (ref 0.0–0.4)
EOS: 1 %
Hematocrit: 43.6 % (ref 34.0–46.6)
Hemoglobin: 14.7 g/dL (ref 11.1–15.9)
IMMATURE GRANS (ABS): 0 10*3/uL (ref 0.0–0.1)
IMMATURE GRANULOCYTES: 0 %
LYMPHS: 25 %
Lymphocytes Absolute: 2.3 10*3/uL (ref 0.7–3.1)
MCH: 31.6 pg (ref 26.6–33.0)
MCHC: 33.7 g/dL (ref 31.5–35.7)
MCV: 94 fL (ref 79–97)
Monocytes Absolute: 0.7 10*3/uL (ref 0.1–0.9)
Monocytes: 8 %
NEUTROS PCT: 65 %
Neutrophils Absolute: 5.9 10*3/uL (ref 1.4–7.0)
PLATELETS: 385 10*3/uL (ref 150–450)
RBC: 4.65 x10E6/uL (ref 3.77–5.28)
RDW: 11.8 % — ABNORMAL LOW (ref 12.3–15.4)
WBC: 9.1 10*3/uL (ref 3.4–10.8)

## 2018-11-03 LAB — BMP8+EGFR
BUN/Creatinine Ratio: 14 (ref 9–23)
BUN: 12 mg/dL (ref 6–24)
CO2: 20 mmol/L (ref 20–29)
Calcium: 10.2 mg/dL (ref 8.7–10.2)
Chloride: 101 mmol/L (ref 96–106)
Creatinine, Ser: 0.84 mg/dL (ref 0.57–1.00)
GFR calc Af Amer: 99 mL/min/{1.73_m2} (ref >59)
GFR, EST NON AFRICAN AMERICAN: 86 mL/min/{1.73_m2} (ref >59)
Glucose: 97 mg/dL (ref 65–99)
POTASSIUM: 4.8 mmol/L (ref 3.5–5.2)
Sodium: 138 mmol/L (ref 134–144)

## 2018-11-04 ENCOUNTER — Telehealth: Payer: Self-pay | Admitting: *Deleted

## 2018-11-04 NOTE — Telephone Encounter (Signed)
Patient informed biospy has returned as normal, just inflammation and to keep appt as scheduled. Verbalized understanding.

## 2018-11-20 ENCOUNTER — Ambulatory Visit: Payer: Medicaid Other | Admitting: Obstetrics & Gynecology

## 2018-11-20 ENCOUNTER — Other Ambulatory Visit: Payer: Medicaid Other

## 2018-12-07 ENCOUNTER — Ambulatory Visit: Payer: Medicaid Other | Admitting: Pediatrics

## 2018-12-07 ENCOUNTER — Encounter: Payer: Self-pay | Admitting: Pediatrics

## 2018-12-07 VITALS — BP 120/83 | HR 89 | Temp 97.4°F | Ht 62.0 in | Wt 142.0 lb

## 2018-12-07 DIAGNOSIS — H65111 Acute and subacute allergic otitis media (mucoid) (sanguinous) (serous), right ear: Secondary | ICD-10-CM

## 2018-12-07 DIAGNOSIS — E876 Hypokalemia: Secondary | ICD-10-CM | POA: Diagnosis not present

## 2018-12-07 DIAGNOSIS — J069 Acute upper respiratory infection, unspecified: Secondary | ICD-10-CM | POA: Diagnosis not present

## 2018-12-07 DIAGNOSIS — K5900 Constipation, unspecified: Secondary | ICD-10-CM | POA: Diagnosis not present

## 2018-12-07 MED ORDER — LINACLOTIDE 145 MCG PO CAPS: 145.0000 ug | ORAL_CAPSULE | Freq: Every day | ORAL | 5 refills | Status: DC

## 2018-12-07 MED ORDER — CETIRIZINE HCL 10 MG PO TABS: 10.0000 mg | ORAL_TABLET | Freq: Every day | ORAL | 11 refills | Status: DC

## 2018-12-07 MED ORDER — AZITHROMYCIN 250 MG PO TABS: ORAL_TABLET | ORAL | 0 refills | Status: DC

## 2018-12-07 MED ORDER — FLUTICASONE PROPIONATE 50 MCG/ACT NA SUSP: 2.0000 | Freq: Every day | NASAL | 6 refills | Status: DC

## 2018-12-07 NOTE — Progress Notes (Signed)
  Subjective:   Patient ID: Laurie Howell, female    DOB: 1976/01/14, 42 y.o.   MRN: [id number] CC: Medical Management of Chronic Issues; Cough; and Nasal Congestion  HPI: Laurie Howell is a 42 y.o. female   Constipation: still bothering her regularly. linzess in the past did help some. Would like refill.   Episode of hypokalemia, was seen in ED. Not on any medications that cause hypokalemia, denies recent episodes of diarrhea, loose stools.   Has had runny nose, congestion, sore throat for past 2-3 weeks. No fevers. Appetite has been ok. Sometimes coughing, mostly dry. Ear pain bothering her the most.   Relevant past medical, surgical, family and social history reviewed. Allergies and medications reviewed and updated. Social History   Tobacco Use  Smoking Status Former Smoker  . Years: 1.00  . Types: Cigarettes  . Last attempt to quit: 12/30/1994  . Years since quitting: 23.9  Smokeless Tobacco Never Used   ROS: Per HPI   Objective:    BP 120/83   Pulse 89   Temp (!) 97.4 F (36.3 C) (Oral)   Ht 5' 2" (1.575 m)   Wt 142 lb (64.4 kg)   BMI 25.97 kg/m   Wt Readings from Last 3 Encounters:  12/08/18 141 lb (64 kg)  12/07/18 142 lb (64.4 kg)  11/02/18 140 lb (63.5 kg)    Gen: NAD, alert, cooperative with exam, NCAT EYES: EOMI, no conjunctival injection, or no icterus ENT:  R TM red with effusion, L TM pink, OP without erythema LYMPH: no cervical LAD CV: NRRR, normal S1/S2, no murmur, distal pulses 2+ b/l Resp: CTABL, no wheezes, normal WOB Abd: +BS, soft, NTND.  Ext: No edema, warm Neuro: Alert and oriented, strength equal b/l UE and LE, coordination grossly normal MSK: normal muscle bulk  Assessment & Plan:  Laurie Howell was seen today for medical management of chronic issues, cough and nasal congestion.  Diagnoses and all orders for this visit:  Constipation, unspecified constipation type Ongoing symptoms, restart below -     linaclotide (LINZESS) 145 MCG CAPS  capsule; Take 1 capsule (145 mcg total) by mouth daily before breakfast.  Hypokalemia -     BMP8+EGFR  Acute mucoid otitis media of right ear Start below -     azithromycin (ZITHROMAX) 250 MG tablet; Take 2 the first day and then one each day after.  Acute URI Symptom control discussed, return precautions -     fluticasone (FLONASE) 50 MCG/ACT nasal spray; Place 2 sprays into both nostrils daily. -     cetirizine (ZYRTEC) 10 MG tablet; Take 1 tablet (10 mg total) by mouth daily.   Follow up plan: Return in about 6 months (around 06/08/2019). Assunta Found, MD [place]

## 2018-12-07 NOTE — Patient Instructions (Signed)
Fever reducer and headache: tylenol and ibuprofen, can take together or alternating   Sinus pressure:  Nasal steroid such as flonase/fluticaone or nasocort daily Can also take daily antihistamine such as loratadine/claritin or cetirizine/zyrtec Decongestant such as phenylephrine, but take in the morning or may keep you awake at night  Sinus rinses/irritation: Netipot or similar with distilled water 2-3 times a day to clear out sinuses or Normal saline nasal spray  Sore throat:  Throat lozenges chloroseptic spray  Stick with bland foods Drink lots of fluids

## 2018-12-08 ENCOUNTER — Encounter: Payer: Self-pay | Admitting: Family Medicine

## 2018-12-08 ENCOUNTER — Ambulatory Visit: Payer: Medicaid Other | Admitting: Family Medicine

## 2018-12-08 VITALS — BP 112/81 | HR 94 | Temp 98.1°F | Ht 62.0 in | Wt 141.0 lb

## 2018-12-08 DIAGNOSIS — Z77120 Contact with and (suspected) exposure to mold (toxic): Secondary | ICD-10-CM

## 2018-12-08 DIAGNOSIS — R5383 Other fatigue: Secondary | ICD-10-CM | POA: Diagnosis not present

## 2018-12-08 DIAGNOSIS — J3489 Other specified disorders of nose and nasal sinuses: Secondary | ICD-10-CM

## 2018-12-08 LAB — BMP8+EGFR
BUN / CREAT RATIO: 14 (ref 9–23)
BUN: 9 mg/dL (ref 6–24)
CHLORIDE: 103 mmol/L (ref 96–106)
CO2: 19 mmol/L — ABNORMAL LOW (ref 20–29)
Calcium: 9.9 mg/dL (ref 8.7–10.2)
Creatinine, Ser: 0.64 mg/dL (ref 0.57–1.00)
GFR calc non Af Amer: 110 mL/min/{1.73_m2} (ref >59)
GFR, EST AFRICAN AMERICAN: 127 mL/min/{1.73_m2} (ref >59)
Glucose: 93 mg/dL (ref 65–99)
Potassium: 4.5 mmol/L (ref 3.5–5.2)
SODIUM: 140 mmol/L (ref 134–144)

## 2018-12-08 NOTE — Progress Notes (Signed)
Subjective:    Patient ID: Laurie Howell, female    DOB: 1976/06/07, 42 y.o.   MRN: [id number]  Chief Complaint:  Testing for mold (frequent colds, headaches; renting a house since August and have been sick since)   HPI: Laurie Howell is a 42 y.o. female presenting on 12/08/2018 for Testing for mold (frequent colds, headaches; renting a house since August and have been sick since)  Pt presents today for mold exposure testing. States she has lived in a house since August. States they have noticed black mold in the house. States the mold in throughout the house. She reports she has fatigue, rhinorrhea, sore throat, cough, and intermittent rashes. States the symptoms are worse at night. Denies fever, chills, headache, chest pain or shortness of breath.   Relevant past medical, surgical, family, and social history reviewed and updated as indicated.  Allergies and medications reviewed and updated.   Past Medical History:  Diagnosis Date  . Anemia   . Esophageal stricture   . Fibroid 06/18/2017  . Frequent UTI   . GERD (gastroesophageal reflux disease)   . Headache   . Iron deficiency anemia 03/28/2016    Past Surgical History:  Procedure Laterality Date  . ESOPHAGOGASTRODUODENOSCOPY N/A 06/27/2016   Procedure: ESOPHAGOGASTRODUODENOSCOPY (EGD);  Surgeon: Danie Binder, MD;  [facility];  Service: Endoscopy;  Laterality: N/A;  1245  . SAVORY DILATION N/A 06/27/2016   Procedure: SAVORY DILATION;  Surgeon: Danie Binder, MD;  [facility]: [facility];  Service: Endoscopy;  Laterality: N/A;  . TUBAL LIGATION  1997  . Tubal reversal  02/2015   [facility] - Dr Karie Kirks    Social History   Socioeconomic History  . Marital status: Legally Separated    Spouse name: Not on file  . Number of children: 2  . Years of education: 8th  . Highest education level: Not on file  Occupational History  . Occupation: N/A  Social Needs  . Financial resource strain: Not on file  . Food  insecurity:    Worry: Not on file    Inability: Not on file  . Transportation needs:    Medical: Not on file    Non-medical: Not on file  Tobacco Use  . Smoking status: Former Smoker    Years: 1.00    Types: Cigarettes    Last attempt to quit: 12/30/1994    Years since quitting: 23.9  . Smokeless tobacco: Never Used  Substance and Sexual Activity  . Alcohol use: No  . Drug use: No  . Sexual activity: Yes    Birth control/protection: Injection    Comment: tubal reversed  Lifestyle  . Physical activity:    Days per week: Not on file    Minutes per session: Not on file  . Stress: Not on file  Relationships  . Social connections:    Talks on phone: Not on file    Gets together: Not on file    Attends religious service: Not on file    Active member of club or organization: Not on file    Attends meetings of clubs or organizations: Not on file    Relationship status: Not on file  . Intimate partner violence:    Fear of current or ex partner: Not on file    Emotionally abused: Not on file    Physically abused: Not on file    Forced sexual activity: Not on file  Other Topics Concern  . Not  on file  Social History Narrative  . Not on file    Outpatient Encounter Medications as of 12/08/2018  Medication Sig  . azithromycin (ZITHROMAX) 250 MG tablet Take 2 the first day and then one each day after.  . cetirizine (ZYRTEC) 10 MG tablet Take 1 tablet (10 mg total) by mouth daily.  Marland Kitchen escitalopram (LEXAPRO) 20 MG tablet Take 1 tablet (20 mg total) by mouth daily.  . fluticasone (FLONASE) 50 MCG/ACT nasal spray Place 2 sprays into both nostrils daily.  Marland Kitchen linaclotide (LINZESS) 145 MCG CAPS capsule Take 1 capsule (145 mcg total) by mouth daily before breakfast.  . medroxyPROGESTERone (DEPO-PROVERA) 150 MG/ML injection Inject 1 mL (150 mg total) into the muscle every 3 (three) months.  . megestrol (MEGACE) 40 MG tablet 3x5d, 2x5d, then 1 daily to help control vaginal bleeding.    Facility-Administered Encounter Medications as of 12/08/2018  Medication  . medroxyPROGESTERone (DEPO-PROVERA) injection 150 mg    Allergies  Allergen Reactions  . Ivp Dye [Iodinated Diagnostic Agents] Anaphylaxis  . Shellfish Allergy Anaphylaxis  . Aspirin Swelling and Other (See Comments)    Reaction:  Facial swelling     Review of Systems  Constitutional: Positive for fatigue. Negative for activity change, chills and fever.  HENT: Positive for congestion, rhinorrhea and sore throat.   Respiratory: Positive for cough. Negative for chest tightness and shortness of breath.   Cardiovascular: Negative for chest pain and palpitations.  Gastrointestinal: Negative for abdominal pain, constipation, diarrhea, nausea and vomiting.  Musculoskeletal: Negative for myalgias.  Neurological: Positive for weakness and headaches. Negative for dizziness and light-headedness.  Psychiatric/Behavioral: Negative for confusion.  All other systems reviewed and are negative.       Objective:    BP 112/81   Pulse 94   Temp 98.1 F (36.7 C) (Oral)   Ht '5\' 2"'  (1.575 m)   Wt 141 lb (64 kg)   BMI 25.79 kg/m    Wt Readings from Last 3 Encounters:  12/08/18 141 lb (64 kg)  12/07/18 142 lb (64.4 kg)  11/02/18 140 lb (63.5 kg)    Physical Exam  Constitutional: She is oriented to person, place, and time. She appears well-developed and well-nourished. She is cooperative. No distress.  HENT:  Head: Normocephalic and atraumatic.  Right Ear: Hearing, tympanic membrane, external ear and ear canal normal.  Left Ear: Hearing, tympanic membrane, external ear and ear canal normal.  Nose: Rhinorrhea present. No mucosal edema.  Mouth/Throat: Uvula is midline and mucous membranes are normal. Posterior oropharyngeal erythema present. No oropharyngeal exudate, posterior oropharyngeal edema or tonsillar abscesses. No tonsillar exudate.  Eyes: Pupils are equal, round, and reactive to light. Conjunctivae are  normal.  Neck: Trachea normal, normal range of motion and phonation normal. Neck supple.  Cardiovascular: Normal rate, regular rhythm and normal heart sounds. Exam reveals no gallop and no friction rub.  No murmur heard. Pulmonary/Chest: Effort normal and breath sounds normal. No respiratory distress.  Lymphadenopathy:    She has no cervical adenopathy.  Neurological: She is alert and oriented to person, place, and time.  Skin: Skin is warm and dry. Capillary refill takes less than 2 seconds. No rash noted.  Psychiatric: She has a normal mood and affect. Her speech is normal and behavior is normal. Judgment and thought content normal. Cognition and memory are normal.  Nursing note and vitals reviewed.   Results for orders placed or performed in visit on 12/07/18  [facility]  Result Value Ref Range  Glucose 93 65 - 99 mg/dL   BUN 9 6 - 24 mg/dL   Creatinine, Ser 0.64 0.57 - 1.00 mg/dL   GFR calc non Af Amer 110 >59 mL/min/1.73   GFR calc Af Amer 127 >59 mL/min/1.73   BUN/Creatinine Ratio 14 9 - 23   Sodium 140 134 - 144 mmol/L   Potassium 4.5 3.5 - 5.2 mmol/L   Chloride 103 96 - 106 mmol/L   CO2 19 (L) 20 - 29 mmol/L   Calcium 9.9 8.7 - 10.2 mg/dL       Pertinent labs & imaging results that were available during my care of the patient were reviewed by me and considered in my medical decision making.  Assessment & Plan:  Franceen was seen today for testing for mold.  Diagnoses and all orders for this visit:  Mold exposure -     Allergen Profile, Mold  Other fatigue -     Allergen Profile, Mold  Rhinorrhea -     Allergen Profile, Mold   Labs pending, will refer to allergist if results are positive.     Continue all other maintenance medications.  Follow up plan: Return if symptoms worsen or fail to improve.   The above assessment and management plan was discussed with the patient. The patient verbalized understanding of and has agreed to the management plan. Patient  is aware to call the clinic if symptoms persist or worsen. Patient is aware when to return to the clinic for a follow-up visit. Patient educated on when it is appropriate to go to the emergency department.   Monia Pouch, FNP-C [facility] [phone]

## 2018-12-08 NOTE — Patient Instructions (Signed)
You had labs performed today.  You will be contacted with the results of the labs once they are available, usually in the next 3 business days for routine lab work.  If you had a pap smear or biopsy performed, expect to be contacted in about 7-10 days.  

## 2018-12-10 LAB — ALLERGEN PROFILE, MOLD
Alternaria Alternata IgE: <0.1 kU/L
Aspergillus Fumigatus IgE: <0.1 kU/L
Candida Albicans IgE: <0.1 kU/L
Cladosporium Herbarum IgE: <0.1 kU/L
M009-IgE Fusarium proliferatum: <0.1 kU/L
M014-IgE Epicoccum purpur: <0.1 kU/L
Penicillium Chrysogen IgE: <0.1 kU/L
Phoma Betae IgE: <0.1 kU/L
Setomelanomma Rostrat: <0.1 kU/L

## 2018-12-12 ENCOUNTER — Encounter: Payer: Self-pay | Admitting: Pediatrics

## 2018-12-28 ENCOUNTER — Telehealth: Payer: Self-pay | Admitting: Pediatrics

## 2018-12-28 NOTE — Telephone Encounter (Signed)
Pt aware mold allergen profile came back negative.

## 2019-04-13 DIAGNOSIS — M50322 Other cervical disc degeneration at C5-C6 level: Secondary | ICD-10-CM | POA: Diagnosis not present

## 2019-04-13 DIAGNOSIS — M25512 Pain in left shoulder: Secondary | ICD-10-CM | POA: Diagnosis not present

## 2019-06-09 ENCOUNTER — Other Ambulatory Visit: Payer: Self-pay

## 2019-06-10 ENCOUNTER — Encounter: Payer: Self-pay | Admitting: Family Medicine

## 2019-06-10 ENCOUNTER — Ambulatory Visit (INDEPENDENT_AMBULATORY_CARE_PROVIDER_SITE_OTHER): Payer: Medicaid Other | Admitting: Family Medicine

## 2019-06-10 VITALS — BP 116/80 | HR 84 | Temp 97.8°F | Ht 63.0 in | Wt 138.0 lb

## 2019-06-10 DIAGNOSIS — E0590 Thyrotoxicosis, unspecified without thyrotoxic crisis or storm: Secondary | ICD-10-CM | POA: Diagnosis not present

## 2019-06-10 DIAGNOSIS — N938 Other specified abnormal uterine and vaginal bleeding: Secondary | ICD-10-CM

## 2019-06-10 NOTE — Patient Instructions (Signed)
Abnormal Uterine Bleeding  Abnormal uterine bleeding means bleeding more than usual from your uterus. It can include:   Bleeding between periods.   Bleeding after sex.   Bleeding that is heavier than normal.   Periods that last longer than usual.   Bleeding after you have stopped having your period (menopause).  There are many problems that may cause this. You should see a doctor for any kind of bleeding that is not normal. Treatment depends on the cause of the bleeding.  Follow these instructions at home:   Watch your condition for any changes.   Do not use tampons, douche, or have sex, if your doctor tells you not to.   Change your pads often.   Get regular well-woman exams. Make sure they include a pelvic exam and cervical cancer screening.   Keep all follow-up visits as told by your doctor. This is important.  Contact a doctor if:   The bleeding lasts more than one week.   You feel dizzy at times.   You feel like you are going to throw up (nauseous).   You throw up.  Get help right away if:   You pass out.   You have to change pads every hour.   You have belly (abdominal) pain.   You have a fever.   You get sweaty.   You get weak.   You passing large blood clots from your vagina.  Summary   Abnormal uterine bleeding means bleeding more than usual from your uterus.   There are many problems that may cause this. You should see a doctor for any kind of bleeding that is not normal.   Treatment depends on the cause of the bleeding.  This information is not intended to replace advice given to you by your health care provider. Make sure you discuss any questions you have with your health care provider.  Document Released: 10/13/2009 Document Revised: 12/10/2016 Document Reviewed: 12/10/2016  [employer]vier Interactive Patient Education  2019 [employer]

## 2019-06-10 NOTE — Progress Notes (Signed)
Subjective:  Patient ID: Laurie Howell, female    DOB: [birth year], 43 y.o.   MRN: [id number]  Chief Complaint:  Menorrhagia   HPI: Laurie Howell is a 43 y.o. female presenting on 06/10/2019 for Menorrhagia  Pt presents today for ongoing DUB. Pt was to have an [place] in 10/2018, pt did not have the study. Pt states she is unhappy with the GYN she was seeing, states they were rude and she would like to be referred elsewhere. Pt states this bout of vaginal bleeding started about 3 weeks ago. She states the bleeding is heavy with clots. States she uses about 10 tampons per hour. She states she does have dizziness and abdominal bloating with the bleeding. No chest pain, shortness of breath, palpitations or weakness.  She also reports ongoing insomnia. States she takes benadryl to help but this is no longer beneficial. Pt has a history of hyperthyroidism. States she has not followed up for this in a long time.   Relevant past medical, surgical, family, and social history reviewed and updated as indicated.  Allergies and medications reviewed and updated.   Past Medical History:  Diagnosis Date  . Anemia   . Esophageal stricture   . Fibroid 06/18/2017  . Frequent UTI   . GERD (gastroesophageal reflux disease)   . Headache   . Iron deficiency anemia 03/28/2016    Past Surgical History:  Procedure Laterality Date  . ESOPHAGOGASTRODUODENOSCOPY N/A 06/27/2016   Procedure: ESOPHAGOGASTRODUODENOSCOPY (EGD);  Surgeon: Danie Binder, MD;  [facility];  Service: Endoscopy;  Laterality: N/A;  1245  . SAVORY DILATION N/A 06/27/2016   Procedure: SAVORY DILATION;  Surgeon: Danie Binder, MD;  Location: [facility];  Service: Endoscopy;  Laterality: N/A;  . TUBAL LIGATION  1997  . Tubal reversal  02/2015   [facility] - Dr Karie Kirks    Social History   Socioeconomic History  . Marital status: Legally Separated    Spouse name: Not on file  . Number of children: 2  . Years of education: 8th   . Highest education level: Not on file  Occupational History  . Occupation: N/A  Social Needs  . Financial resource strain: Not on file  . Food insecurity    Worry: Not on file    Inability: Not on file  . Transportation needs    Medical: Not on file    Non-medical: Not on file  Tobacco Use  . Smoking status: Former Smoker    Years: 1.00    Types: Cigarettes    Quit date: 12/30/1994    Years since quitting: 24.4  . Smokeless tobacco: Never Used  Substance and Sexual Activity  . Alcohol use: No  . Drug use: No  . Sexual activity: Yes    Birth control/protection: Injection    Comment: tubal reversed  Lifestyle  . Physical activity    Days per week: Not on file    Minutes per session: Not on file  . Stress: Not on file  Relationships  . Social Herbalist on phone: Not on file    Gets together: Not on file    Attends religious service: Not on file    Active member of club or organization: Not on file    Attends meetings of clubs or organizations: Not on file    Relationship status: Not on file  . Intimate partner violence    Fear of current or ex partner: Not on  file    Emotionally abused: Not on file    Physically abused: Not on file    Forced sexual activity: Not on file  Other Topics Concern  . Not on file  Social History Narrative  . Not on file    Outpatient Encounter Medications as of 06/10/2019  Medication Sig  . escitalopram (LEXAPRO) 20 MG tablet Take 1 tablet (20 mg total) by mouth daily.  . [DISCONTINUED] azithromycin (ZITHROMAX) 250 MG tablet Take 2 the first day and then one each day after.  . [DISCONTINUED] cetirizine (ZYRTEC) 10 MG tablet Take 1 tablet (10 mg total) by mouth daily.  . [DISCONTINUED] fluticasone (FLONASE) 50 MCG/ACT nasal spray Place 2 sprays into both nostrils daily.  . [DISCONTINUED] linaclotide (LINZESS) 145 MCG CAPS capsule Take 1 capsule (145 mcg total) by mouth daily before breakfast.  . [DISCONTINUED] medroxyPROGESTERone  (DEPO-PROVERA) 150 MG/ML injection Inject 1 mL (150 mg total) into the muscle every 3 (three) months.  . [DISCONTINUED] megestrol (MEGACE) 40 MG tablet 3x5d, 2x5d, then 1 daily to help control vaginal bleeding.  . [DISCONTINUED] medroxyPROGESTERone (DEPO-PROVERA) injection 150 mg    No facility-administered encounter medications on file as of 06/10/2019.     Allergies  Allergen Reactions  . Ivp Dye [Iodinated Diagnostic Agents] Anaphylaxis  . Shellfish Allergy Anaphylaxis  . Aspirin Swelling and Other (See Comments)    Reaction:  Facial swelling     Review of Systems  Constitutional: Negative for chills, diaphoresis, fatigue and fever.  Respiratory: Negative for cough, chest tightness and shortness of breath.   Cardiovascular: Negative for chest pain, palpitations and leg swelling.  Gastrointestinal: Positive for abdominal distention. Negative for abdominal pain, anal bleeding, blood in stool, constipation, diarrhea, nausea, rectal pain and vomiting.  Genitourinary: Positive for menstrual problem and vaginal bleeding. Negative for decreased urine volume, difficulty urinating, vaginal discharge and vaginal pain.  Musculoskeletal: Negative for arthralgias and back pain.  Neurological: Positive for dizziness. Negative for weakness, light-headedness and headaches.  Psychiatric/Behavioral: Positive for sleep disturbance. Negative for confusion.  All other systems reviewed and are negative.       Objective:  BP 116/80   Pulse 84   Temp 97.8 F (36.6 C) (Oral)   Ht 5\' 3"  (1.6 m)   Wt 138 lb (62.6 kg)   BMI 24.45 kg/m    Wt Readings from Last 3 Encounters:  06/10/19 138 lb (62.6 kg)  12/08/18 141 lb (64 kg)  12/07/18 142 lb (64.4 kg)    Physical Exam Vitals signs and nursing note reviewed. Exam conducted with a chaperone present.  Constitutional:      General: She is not in acute distress.    Appearance: Normal appearance. She is well-developed and well-groomed. She is not  ill-appearing, toxic-appearing or diaphoretic.  HENT:     Head: Normocephalic and atraumatic.     Jaw: There is normal jaw occlusion.     Right Ear: Hearing normal.     Left Ear: Hearing normal.     Nose: Nose normal.     Mouth/Throat:     Lips: Pink.     Mouth: Mucous membranes are moist.     Pharynx: Oropharynx is clear. Uvula midline.  Eyes:     General: Lids are normal.     Extraocular Movements: Extraocular movements intact.     Conjunctiva/sclera: Conjunctivae normal.     Pupils: Pupils are equal, round, and reactive to light.  Neck:     Musculoskeletal: Normal range of motion and neck  supple.     Thyroid: No thyroid mass, thyromegaly or thyroid tenderness.     Vascular: No carotid bruit or JVD.     Trachea: Trachea and phonation normal.  Cardiovascular:     Rate and Rhythm: Normal rate and regular rhythm.     Chest Wall: PMI is not displaced.     Pulses: Normal pulses.     Heart sounds: Normal heart sounds. No murmur. No friction rub. No gallop.   Pulmonary:     Effort: Pulmonary effort is normal. No respiratory distress.     Breath sounds: Normal breath sounds. No wheezing.  Abdominal:     General: Bowel sounds are normal. There is no distension or abdominal bruit.     Palpations: Abdomen is soft. There is no hepatomegaly or splenomegaly.     Tenderness: There is no abdominal tenderness. There is no right CVA tenderness or left CVA tenderness.     Hernia: No hernia is present. There is no hernia in the left inguinal area or right inguinal area.  Genitourinary:    General: Normal vulva.     Exam position: Lithotomy position.     Pubic Area: No rash or pubic lice.      Labia:        Right: No rash, tenderness, lesion or injury.        Left: No rash, tenderness, lesion or injury.      Urethra: No prolapse, urethral pain, urethral swelling or urethral lesion.     Vagina: No signs of injury and foreign body. Bleeding (scant amoutn of blood in vaginal vault.) present. No  vaginal discharge, erythema, tenderness, lesions or prolapsed vaginal walls.     Cervix: Normal.  Musculoskeletal: Normal range of motion.     Right lower leg: No edema.     Left lower leg: No edema.  Lymphadenopathy:     Cervical: No cervical adenopathy.     Lower Body: No right inguinal adenopathy. No left inguinal adenopathy.  Skin:    General: Skin is warm and dry.     Capillary Refill: Capillary refill takes less than 2 seconds.     Coloration: Skin is not cyanotic, jaundiced or pale.     Findings: No rash.  Neurological:     General: No focal deficit present.     Mental Status: She is alert and oriented to person, place, and time.     Cranial Nerves: Cranial nerves are intact.     Sensory: Sensation is intact.     Motor: Motor function is intact.     Coordination: Coordination is intact.     Gait: Gait is intact.     Deep Tendon Reflexes: Reflexes are normal and symmetric.  Psychiatric:        Attention and Perception: Attention and perception normal.        Mood and Affect: Mood and affect normal.        Speech: Speech normal.        Behavior: Behavior normal. Behavior is cooperative.        Thought Content: Thought content normal.        Cognition and Memory: Cognition and memory normal.        Judgment: Judgment normal.     Results for orders placed or performed in visit on 12/08/18  Allergen Profile, Mold  Result Value Ref Range   Class Description Comment    Penicillium Chrysogen IgE <0.10 Class 0 kU/L   Cladosporium Herbarum IgE <0.10 Class  0 kU/L   Aspergillus Fumigatus IgE <0.10 Class 0 kU/L   Mucor Racemosus IgE <0.10 Class 0 kU/L   Candida Albicans IgE <0.10 Class 0 kU/L   Alternaria Alternata IgE <0.10 Class 0 kU/L   Setomelanomma Rostrat <0.10 Class 0 kU/L   M009-IgE Fusarium proliferatum <0.10 Class 0 kU/L   Aureobasidi Pullulans IgE <0.10 Class 0 kU/L   Phoma Betae IgE <0.10 Class 0 kU/L   M014-IgE Epicoccum purpur <0.10 Class 0 kU/L   Stemphylium  Herbarum IgE <0.10 Class 0 kU/L       Pertinent labs & imaging results that were available during my care of the patient were reviewed by me and considered in my medical decision making.  Assessment & Plan:  Laurie Howell was seen today for menorrhagia.  Diagnoses and all orders for this visit:  DUB (dysfunctional uterine bleeding) Scan amount of blood in vaginal vault. Labs pending. Urgent referral to GYN for ongoing DUB. Report any new or worsening symptoms.  -     CBC with Differential/Platelet -     Ambulatory referral to Obstetrics / Gynecology  Hyperthyroidism Insomnia. Will check labs today. Sleep hygiene discussed. Can try melatonin over the counter.  -     TSH     Continue all other maintenance medications.  Follow up plan: Return in about 2 weeks (around 06/24/2019), or if symptoms worsen or fail to improve.  Educational handout given for DUB  The above assessment and management plan was discussed with the patient. The patient verbalized understanding of and has agreed to the management plan. Patient is aware to call the clinic if symptoms persist or worsen. Patient is aware when to return to the clinic for a follow-up visit. Patient educated on when it is appropriate to go to the emergency department.   Monia Pouch, FNP-C [facility] [phone]

## 2019-06-11 LAB — CBC WITH DIFFERENTIAL/PLATELET
Basophils Absolute: 0.1 10*3/uL (ref 0.0–0.2)
Basos: 2 %
EOS (ABSOLUTE): 0.1 10*3/uL (ref 0.0–0.4)
Eos: 3 %
Hematocrit: 40.6 % (ref 34.0–46.6)
Hemoglobin: 13.2 g/dL (ref 11.1–15.9)
Immature Grans (Abs): 0 10*3/uL (ref 0.0–0.1)
Immature Granulocytes: 0 %
Lymphocytes Absolute: 1.5 10*3/uL (ref 0.7–3.1)
Lymphs: 30 %
MCH: 30.6 pg (ref 26.6–33.0)
MCHC: 32.5 g/dL (ref 31.5–35.7)
MCV: 94 fL (ref 79–97)
Monocytes Absolute: 0.4 10*3/uL (ref 0.1–0.9)
Monocytes: 8 %
Neutrophils Absolute: 2.8 10*3/uL (ref 1.4–7.0)
Neutrophils: 57 %
Platelets: 299 10*3/uL (ref 150–450)
RBC: 4.31 x10E6/uL (ref 3.77–5.28)
RDW: 12 % (ref 11.7–15.4)
WBC: 4.8 10*3/uL (ref 3.4–10.8)

## 2019-06-11 LAB — TSH: TSH: 0.363 u[IU]/mL — ABNORMAL LOW (ref 0.450–4.500)

## 2019-06-11 NOTE — Addendum Note (Signed)
Addended by: Baruch Gouty on: 06/11/2019 09:49 AM   Modules accepted: Orders

## 2019-06-23 ENCOUNTER — Ambulatory Visit: Payer: Medicaid Other | Admitting: Adult Health

## 2019-07-11 DIAGNOSIS — R8290 Unspecified abnormal findings in urine: Secondary | ICD-10-CM | POA: Diagnosis not present

## 2019-07-11 DIAGNOSIS — R300 Dysuria: Secondary | ICD-10-CM | POA: Diagnosis not present

## 2019-07-11 DIAGNOSIS — N3001 Acute cystitis with hematuria: Secondary | ICD-10-CM | POA: Diagnosis not present

## 2019-07-22 ENCOUNTER — Other Ambulatory Visit: Payer: Self-pay

## 2019-07-22 ENCOUNTER — Ambulatory Visit (INDEPENDENT_AMBULATORY_CARE_PROVIDER_SITE_OTHER): Payer: Medicaid Other | Admitting: "Endocrinology

## 2019-07-22 ENCOUNTER — Encounter: Payer: Self-pay | Admitting: "Endocrinology

## 2019-07-22 VITALS — BP 107/75 | HR 93 | Ht 63.0 in | Wt 137.0 lb

## 2019-07-22 DIAGNOSIS — R7989 Other specified abnormal findings of blood chemistry: Secondary | ICD-10-CM

## 2019-07-22 NOTE — Progress Notes (Signed)
Endocrinology Consult Note                                            07/22/2019, 6:14 PM   Subjective:    Patient ID: Laurie Howell, female    DOB: 05-Jul-1976, PCP Rakes, Connye Burkitt, FNP   Past Medical History:  Diagnosis Date  . Anemia   . Esophageal stricture   . Fibroid 06/18/2017  . Frequent UTI   . GERD (gastroesophageal reflux disease)   . Headache   . Iron deficiency anemia 03/28/2016   Past Surgical History:  Procedure Laterality Date  . ESOPHAGOGASTRODUODENOSCOPY N/A 06/27/2016   Procedure: ESOPHAGOGASTRODUODENOSCOPY (EGD);  Surgeon: Danie Binder, MD;  [facility];  Service: Endoscopy;  Laterality: N/A;  1245  . SAVORY DILATION N/A 06/27/2016   Procedure: SAVORY DILATION;  Surgeon: Danie Binder, MD;  [facility];  Service: Endoscopy;  Laterality: N/A;  . TUBAL LIGATION  1997  . Tubal reversal  02/2015   [facility] - Dr Karie Kirks   Social History   Socioeconomic History  . Marital status: Legally Separated    Spouse name: Not on file  . Number of children: 2  . Years of education: 8th  . Highest education level: Not on file  Occupational History  . Occupation: N/A  Social Needs  . Financial resource strain: Not on file  . Food insecurity    Worry: Not on file    Inability: Not on file  . Transportation needs    Medical: Not on file    Non-medical: Not on file  Tobacco Use  . Smoking status: Former Smoker    Years: 1.00    Types: Cigarettes    Quit date: 12/30/1994    Years since quitting: 24.5  . Smokeless tobacco: Never Used  Substance and Sexual Activity  . Alcohol use: No  . Drug use: No  . Sexual activity: Yes    Birth control/protection: Injection    Comment: tubal reversed  Lifestyle  . Physical activity    Days per week: Not on file    Minutes per session: Not on file  . Stress: Not on file  Relationships  . Social Herbalist on phone: Not on file    Gets together: Not on file    Attends religious  service: Not on file    Active member of club or organization: Not on file    Attends meetings of clubs or organizations: Not on file    Relationship status: Not on file  Other Topics Concern  . Not on file  Social History Narrative  . Not on file   Family History  Problem Relation Age of Onset  . Depression Mother   . Hypertension Mother   . Other Mother        open heart surgery  . Heart disease Father   . Hypertension Father   . Anxiety disorder Father   . Heart attack Father   . Stroke Maternal Grandmother   . Colon cancer Neg Hx    Outpatient Encounter Medications as of 07/22/2019  Medication Sig  . escitalopram (LEXAPRO) 20 MG tablet Take 1 tablet (20 mg total) by mouth daily.   No facility-administered encounter medications on file as of 07/22/2019.    ALLERGIES: Allergies  Allergen Reactions  .  Ivp Dye [Iodinated Diagnostic Agents] Anaphylaxis  . Shellfish Allergy Anaphylaxis  . Aspirin Swelling and Other (See Comments)    Reaction:  Facial swelling     VACCINATION STATUS: Immunization History  Administered Date(s) Administered  . Influenza,inj,Quad PF,6+ Mos 09/28/2018  . Tdap 03/17/2018    HPI Laurie Howell is 43 y.o. female who presents today with a medical history as above. she is being seen in consultation for abnormal thyroid function test requested by Laurie Gouty, FNP.  -She denies any prior history of thyroid dysfunction.  She is not on any antithyroid medications nor thyroid hormone supplement.  She was found to have suppressed TSH of 0.36 with no associated T4 T3 measurement on June 10, 2019.  She reports and current symptoms including weight gain of 25 pounds in the last year (however her records show that she weighed 4 pounds more in December 2019) , on and off palpitations, on and off tremors, avoidance of heat intolerance.  She also reports syncopal episodes which led to a fall at least one occasion.  She denies any prior history of adrenal  dysfunction.  No family history of adrenal, parathyroid, pituitary dysfunction.  She has family history of coronary artery disease in both parents.  Patient also reports menometrorrhagia currently on work-up with her OB/GYN providers.  She denies dysphagia, shortness of breath, nor voice change.  Review of Systems  Constitutional: ? recent weight gain, +fatigue, +subjective hyperthermia, no subjective hypothermia Eyes: no blurry vision, no xerophthalmia ENT: no sore throat, no nodules palpated in throat, no dysphagia/odynophagia, no hoarseness Cardiovascular: no Chest Pain, no Shortness of Breath, no palpitations, no leg swelling Respiratory: no cough, no shortness of breath Gastrointestinal: no Nausea/Vomiting/Diarhhea Musculoskeletal: no muscle/joint aches Skin: no rashes Neurological: no tremors, no numbness, no tingling, no dizziness Psychiatric: no depression, no anxiety  Objective:    BP 107/75   Pulse 93   Ht 5\' 3"  (1.6 m)   Wt 137 lb (62.1 kg)   BMI 24.27 kg/m   Wt Readings from Last 3 Encounters:  07/22/19 137 lb (62.1 kg)  06/10/19 138 lb (62.6 kg)  12/08/18 141 lb (64 kg)    Physical Exam  Constitutional: + Appropriate weight for height, not in acute distress, normal state of mind Eyes: PERRLA, EOMI, no exophthalmos ENT: moist mucous membranes, + palpable thyroid-not grossly enlarged,  no gross cervical lymphadenopathy Cardiovascular: normal precordial activity, Regular Rate and Rhythm, no Murmur/Rubs/Gallops Respiratory:  adequate breathing efforts, no gross chest deformity, Clear to auscultation bilaterally Gastrointestinal: abdomen soft, Non -tender, No distension, Bowel Sounds present, no gross organomegaly Musculoskeletal: no gross deformities, strength intact in all four extremities Skin: moist, warm, no rashes Neurological: no tremor with outstretched hands, Deep tendon reflexes thyroid function test in bilateral lower extremities.  CMP ( most  recent) CMP     Component Value Date/Time   NA 140 12/07/2018 1603   K 4.5 12/07/2018 1603   CL 103 12/07/2018 1603   CO2 19 (L) 12/07/2018 1603   GLUCOSE 93 12/07/2018 1603   GLUCOSE 95 10/26/2018 2153   BUN 9 12/07/2018 1603   CREATININE 0.64 12/07/2018 1603   CALCIUM 9.9 12/07/2018 1603   PROT 8.2 (H) 10/26/2018 2153   PROT 7.0 04/07/2018 1454   ALBUMIN 4.4 10/26/2018 2153   ALBUMIN 4.2 04/07/2018 1454   AST 30 10/26/2018 2153   ALT 30 10/26/2018 2153   ALKPHOS 90 10/26/2018 2153   BILITOT 0.5 10/26/2018 2153   BILITOT 0.4 04/07/2018  [place] 12/07/2018 1603   GFRAA 127 12/07/2018 1603      Lipid Panel ( most recent) Lipid Panel     Component Value Date/Time   CHOL 130 03/28/2016 1250   TRIG 62 03/28/2016 1250   HDL 49 03/28/2016 1250   CHOLHDL 2.7 03/28/2016 1250   LDLCALC 69 03/28/2016 1250      Lab Results  Component Value Date   TSH 0.363 (L) 06/10/2019   TSH 0.643 08/26/2018   TSH 0.578 04/07/2018   TSH 0.031 (L) 02/05/2017   TSH 0.009 (L) 01/17/2017   TSH 0.658 10/16/2016   TSH 0.696 03/28/2016      Assessment & Plan:   1. Abnormal thyroid blood test   - Breanne Olvera [place]  is being seen at a kind request of Rakes, Connye Burkitt, FNP. - I have reviewed her available thyroid/endocrine records and clinically evaluated the patient. - Based on these reviews, she has slightly suppressed TSH,  however,  there is not sufficient information to proceed with definitive treatment plan.  Her symptoms are nonspecific , out of proportion for degree of current suppression of her TSH and conflicting.  She did have suppressed TSH as low as 0.009 in January 2018.  - she will need a repeat,  more complete thyroid function tests towards confirming the diagnosis.  -Given her episodic syncope, and ischemia, she will be considered for a.m. cortisol to rule out adrenal insufficiency.  She will also be offered A1c without diabetes, 25 hydroxy vitamin D measurement  . -she will return in 1 week to review her repeat labs.   If her  labs are suggestive of primary hyperthyroidism, she will be considered for thyroid uptake and scan to confirm the diagnosis.  - I did not initiate any new prescriptions today.  -She is advised to finish her follow-up with her OB/GYN given her menometrorrhagia. - I advised her  to maintain close follow up with Rakes, Connye Burkitt, FNP for primary care needs.   - Time spent with the patient: 45 minutes, of which >50% was spent in obtaining information about her symptoms, reviewing her previous labs/studies,  evaluations, and treatments, counseling her about her abnormal thyroid function tests, and developing a plan to confirm the diagnosis and long term treatment based on the latest standards of care/guidelines.    Jon Gills Pontillo participated in the discussions, expressed understanding, and voiced agreement with the above plans.  All questions were answered to her satisfaction. she is encouraged to contact clinic should she have any questions or concerns prior to her return visit.  Follow up plan: Return in about 1 week (around 07/29/2019), or labs tomorrow before  8AM, for Follow up with Pre-visit Labs.   Glade Lloyd, MD [facility] [address] [place], [place] [address] Phone: [phone]  Fax: [phone]     07/22/2019, 6:14 PM  This note was partially dictated with voice recognition software. Similar sounding words can be transcribed inadequately or may not  be corrected upon review.

## 2019-07-23 ENCOUNTER — Other Ambulatory Visit: Payer: Medicaid Other

## 2019-07-23 ENCOUNTER — Telehealth: Payer: Self-pay

## 2019-07-23 DIAGNOSIS — R7989 Other specified abnormal findings of blood chemistry: Secondary | ICD-10-CM

## 2019-07-23 DIAGNOSIS — E0590 Thyrotoxicosis, unspecified without thyrotoxic crisis or storm: Secondary | ICD-10-CM | POA: Diagnosis not present

## 2019-07-23 DIAGNOSIS — R739 Hyperglycemia, unspecified: Secondary | ICD-10-CM

## 2019-07-23 DIAGNOSIS — E270 Other adrenocortical overactivity: Secondary | ICD-10-CM

## 2019-07-23 DIAGNOSIS — D509 Iron deficiency anemia, unspecified: Secondary | ICD-10-CM | POA: Diagnosis not present

## 2019-07-23 NOTE — Telephone Encounter (Signed)
Laurie Howell, CMA  

## 2019-07-24 LAB — T4, FREE: Free T4: 0.94 ng/dL (ref 0.82–1.77)

## 2019-07-24 LAB — VITAMIN D 25 HYDROXY (VIT D DEFICIENCY, FRACTURES): Vit D, 25-Hydroxy: 47.9 ng/mL (ref 30.0–100.0)

## 2019-07-24 LAB — COMPREHENSIVE METABOLIC PANEL
ALT: 8 IU/L (ref 0–32)
AST: 13 IU/L (ref 0–40)
Albumin/Globulin Ratio: 1.7 (ref 1.2–2.2)
Albumin: 4.3 g/dL (ref 3.8–4.8)
Alkaline Phosphatase: 84 IU/L (ref 39–117)
BUN/Creatinine Ratio: 15 (ref 9–23)
BUN: 11 mg/dL (ref 6–24)
Bilirubin Total: 0.3 mg/dL (ref 0.0–1.2)
CO2: 21 mmol/L (ref 20–29)
Calcium: 9.6 mg/dL (ref 8.7–10.2)
Chloride: 102 mmol/L (ref 96–106)
Creatinine, Ser: 0.73 mg/dL (ref 0.57–1.00)
GFR calc Af Amer: 117 mL/min/{1.73_m2} (ref >59)
GFR calc non Af Amer: 101 mL/min/{1.73_m2} (ref >59)
Globulin, Total: 2.6 g/dL (ref 1.5–4.5)
Glucose: 92 mg/dL (ref 65–99)
Potassium: 4.1 mmol/L (ref 3.5–5.2)
Sodium: 138 mmol/L (ref 134–144)
Total Protein: 6.9 g/dL (ref 6.0–8.5)

## 2019-07-24 LAB — HEMOGLOBIN A1C
Est. average glucose Bld gHb Est-mCnc: 103 mg/dL
Hgb A1c MFr Bld: 5.2 % (ref 4.8–5.6)

## 2019-07-24 LAB — CORTISOL-AM, BLOOD: Cortisol - AM: 20.1 ug/dL — ABNORMAL HIGH (ref 6.2–19.4)

## 2019-07-24 LAB — TSH: TSH: 0.718 u[IU]/mL (ref 0.450–4.500)

## 2019-07-24 LAB — THYROID PEROXIDASE ANTIBODY: Thyroperoxidase Ab SerPl-aCnc: 14 IU/mL (ref 0–34)

## 2019-07-24 LAB — T3, FREE: T3, Free: 3.4 pg/mL (ref 2.0–4.4)

## 2019-07-24 LAB — THYROGLOBULIN ANTIBODY: Thyroglobulin Antibody: <1 IU/mL (ref 0.0–0.9)

## 2019-07-29 ENCOUNTER — Telehealth: Payer: Self-pay | Admitting: Women's Health

## 2019-07-29 ENCOUNTER — Encounter: Payer: Self-pay | Admitting: "Endocrinology

## 2019-07-29 ENCOUNTER — Other Ambulatory Visit: Payer: Self-pay

## 2019-07-29 ENCOUNTER — Ambulatory Visit (INDEPENDENT_AMBULATORY_CARE_PROVIDER_SITE_OTHER): Payer: Medicaid Other | Admitting: "Endocrinology

## 2019-07-29 VITALS — BP 127/83 | HR 91 | Ht 63.0 in | Wt 135.0 lb

## 2019-07-29 DIAGNOSIS — R7989 Other specified abnormal findings of blood chemistry: Secondary | ICD-10-CM

## 2019-07-29 NOTE — Telephone Encounter (Signed)
Pt states she has been bleeding fo 4 months and her abdomen is swollen. She states that Dr. Liliane Channel office stated for her to contact us to be seen and to have a [place].

## 2019-07-29 NOTE — Progress Notes (Signed)
Endocrinology follow-up note                                             07/29/2019, 5:12 PM   Subjective:    Patient ID: Laurie Howell, female    DOB: 01-23-1976, PCP Rakes, Connye Burkitt, FNP   Past Medical History:  Diagnosis Date  . Anemia   . Esophageal stricture   . Fibroid 06/18/2017  . Frequent UTI   . GERD (gastroesophageal reflux disease)   . Headache   . Iron deficiency anemia 03/28/2016   Past Surgical History:  Procedure Laterality Date  . ESOPHAGOGASTRODUODENOSCOPY N/A 06/27/2016   Procedure: ESOPHAGOGASTRODUODENOSCOPY (EGD);  Surgeon: Danie Binder, MD;  [facility];  Service: Endoscopy;  Laterality: N/A;  1245  . SAVORY DILATION N/A 06/27/2016   Procedure: SAVORY DILATION;  Surgeon: Danie Binder, MD;  [facility];  Service: Endoscopy;  Laterality: N/A;  . TUBAL LIGATION  1997  . Tubal reversal  02/2015   [facility] - Dr Karie Kirks   Social History   Socioeconomic History  . Marital status: Legally Separated    Spouse name: Not on file  . Number of children: 2  . Years of education: 8th  . Highest education level: Not on file  Occupational History  . Occupation: N/A  Social Needs  . Financial resource strain: Not on file  . Food insecurity    Worry: Not on file    Inability: Not on file  . Transportation needs    Medical: Not on file    Non-medical: Not on file  Tobacco Use  . Smoking status: Former Smoker    Years: 1.00    Types: Cigarettes    Quit date: 12/30/1994    Years since quitting: 24.5  . Smokeless tobacco: Never Used  Substance and Sexual Activity  . Alcohol use: No  . Drug use: No  . Sexual activity: Yes    Birth control/protection: Injection    Comment: tubal reversed  Lifestyle  . Physical activity    Days per week: Not on file    Minutes per session: Not on file  . Stress: Not on file  Relationships  . Social Herbalist on phone: Not on file    Gets together: Not on file    Attends  religious service: Not on file    Active member of club or organization: Not on file    Attends meetings of clubs or organizations: Not on file    Relationship status: Not on file  Other Topics Concern  . Not on file  Social History Narrative  . Not on file   Family History  Problem Relation Age of Onset  . Depression Mother   . Hypertension Mother   . Other Mother        open heart surgery  . Heart disease Father   . Hypertension Father   . Anxiety disorder Father   . Heart attack Father   . Stroke Maternal Grandmother   . Colon cancer Neg Hx    Outpatient Encounter Medications as of 07/29/2019  Medication Sig  . escitalopram (LEXAPRO) 20 MG tablet Take 1 tablet (20 mg total) by mouth daily.   No facility-administered encounter medications on file as of 07/29/2019.    ALLERGIES: Allergies  Allergen Reactions  .  Ivp Dye [Iodinated Diagnostic Agents] Anaphylaxis  . Shellfish Allergy Anaphylaxis  . Aspirin Swelling and Other (See Comments)    Reaction:  Facial swelling     VACCINATION STATUS: Immunization History  Administered Date(s) Administered  . Influenza,inj,Quad PF,6+ Mos 09/28/2018  . Tdap 03/17/2018    HPI Laurie Howell is 43 y.o. female who presents today with a medical history as above. she is being seen in follow-up with repeat labs after she was seen in consultation for abnormal thyroid function tests with suppressed TSH.   -She denies any prior history of thyroid dysfunction.  She is not on any antithyroid medications nor thyroid hormone supplement.  She was found to have suppressed TSH of 0.36 with no associated T4 T3 measurement on June 10, 2019.   Her most recent full profile repeat thyroid function test  -She has a steady weight since last visit,  -She denies heat intolerance, tremors, no palpitations.   -She reports significant amount of stress from family relations, she also reports syncopal episodes which led to a fall at least one occasion.   She denies any prior history of adrenal dysfunction.   On today's visit, she reveals that she regularly uses tanning bed indoors.   No family history of adrenal, parathyroid, pituitary dysfunction.  She has family history of coronary artery disease in both parents.  Patient also reports menometrorrhagia currently on work-up with her OB/GYN providers.  She denies dysphagia, shortness of breath, nor voice change.  Review of Systems  Constitutional: , +fatigue, +subjective hyperthermia, no subjective hypothermia Eyes: no blurry vision, no xerophthalmia ENT: no sore throat, no nodules palpated in throat, no dysphagia/odynophagia, no hoarseness Musculoskeletal: no muscle/joint aches Skin: no rashes Neurological: no tremors, no numbness, no tingling, no dizziness Psychiatric: no depression, no anxiety  Objective:    BP 127/83   Pulse 91   Ht 5\' 3"  (1.6 m)   Wt 135 lb (61.2 kg)   BMI 23.91 kg/m   Wt Readings from Last 3 Encounters:  07/29/19 135 lb (61.2 kg)  07/22/19 137 lb (62.1 kg)  06/10/19 138 lb (62.6 kg)    Physical Exam Constitutional:  not in acute distress, normal state of mind Eyes:  EOMI, no exophthalmos Neck: Supple Respiratory: Adequate breathing efforts Musculoskeletal: no gross deformities, strength intact in all four extremities, no gross restriction of joint movements Skin:  no rashes, no hyperemia Neurological: no tremor with outstretched hands.   CMP ( most recent) CMP     Component Value Date/Time   NA 138 07/23/2019 0807   K 4.1 07/23/2019 0807   CL 102 07/23/2019 0807   CO2 21 07/23/2019 0807   GLUCOSE 92 07/23/2019 0807   GLUCOSE 95 10/26/2018 2153   BUN 11 07/23/2019 0807   CREATININE 0.73 07/23/2019 0807   CALCIUM 9.6 07/23/2019 0807   PROT 6.9 07/23/2019 0807   ALBUMIN 4.3 07/23/2019 0807   AST 13 07/23/2019 0807   ALT 8 07/23/2019 0807   ALKPHOS 84 07/23/2019 0807   BILITOT 0.3 07/23/2019 0807   GFRNONAA 101 07/23/2019 0807   GFRAA 117  07/23/2019 0807      Lipid Panel ( most recent) Lipid Panel     Component Value Date/Time   CHOL 130 03/28/2016 1250   TRIG 62 03/28/2016 1250   HDL 49 03/28/2016 1250   CHOLHDL 2.7 03/28/2016 1250   LDLCALC 69 03/28/2016 1250      Lab Results  Component Value Date   TSH 0.718 07/23/2019  TSH 0.363 (L) 06/10/2019   TSH 0.643 08/26/2018   TSH 0.578 04/07/2018   TSH 0.031 (L) 02/05/2017   TSH 0.009 (L) 01/17/2017   TSH 0.658 10/16/2016   TSH 0.696 03/28/2016   FREET4 0.94 07/23/2019      Assessment & Plan:   1. Abnormal thyroid blood test -Her repeat labs are significant for normal thyroid function test, absence of diabetes,  adrenal sufficiency, slightly  elevated a.m. cortisol likely related to stress, normal vitamin D levels. -She did not have gross basic endocrine deficit at this time. -She will not require intervention with antithyroid medications nor with thyroid hormone supplement.  -She is advised to manage her stress as much as she can, follow-up with her OB/GYN regarding her menometrorrhagia.  She is advised to cut back on use of the tanning bed. - I did not initiate any new prescriptions today.  - I advised her  to maintain close follow up with Rakes, Connye Burkitt, FNP for primary care needs.   Time for this visit: 15 minutes. Jon Gills Adriano  participated in the discussions, expressed understanding, and voiced agreement with the above plans.  All questions were answered to her satisfaction. she is encouraged to contact clinic should she have any questions or concerns prior to her return visit.  Follow up plan: Return in about 1 year (around 07/28/2020) for Follow up with Pre-visit Labs.   Glade Lloyd, MD [facility] [address] [place], [place] [address] Phone: [phone]  Fax: [phone]     07/29/2019, 5:12 PM  This note was partially dictated with voice recognition software. Similar sounding  words can be transcribed inadequately or may not  be corrected upon review.

## 2019-08-09 ENCOUNTER — Ambulatory Visit: Payer: Medicaid Other | Admitting: Obstetrics and Gynecology

## 2019-08-12 ENCOUNTER — Ambulatory Visit: Payer: Medicaid Other | Admitting: Family Medicine

## 2019-08-12 NOTE — Progress Notes (Deleted)
Pt with vaginal bleeding. Provider working remote. Pt to come in office tomorrow for evaluation.

## 2019-08-13 ENCOUNTER — Other Ambulatory Visit: Payer: Self-pay

## 2019-08-13 ENCOUNTER — Encounter: Payer: Self-pay | Admitting: Internal Medicine

## 2019-08-13 ENCOUNTER — Ambulatory Visit: Payer: Medicaid Other | Admitting: Family Medicine

## 2019-08-13 ENCOUNTER — Encounter: Payer: Self-pay | Admitting: Family Medicine

## 2019-08-13 VITALS — BP 100/74 | HR 89 | Temp 99.6°F | Ht 63.0 in | Wt 139.0 lb

## 2019-08-13 DIAGNOSIS — F339 Major depressive disorder, recurrent, unspecified: Secondary | ICD-10-CM

## 2019-08-13 DIAGNOSIS — N938 Other specified abnormal uterine and vaginal bleeding: Secondary | ICD-10-CM | POA: Diagnosis not present

## 2019-08-13 DIAGNOSIS — K5904 Chronic idiopathic constipation: Secondary | ICD-10-CM | POA: Insufficient documentation

## 2019-08-13 DIAGNOSIS — F411 Generalized anxiety disorder: Secondary | ICD-10-CM | POA: Diagnosis not present

## 2019-08-13 DIAGNOSIS — G4701 Insomnia due to medical condition: Secondary | ICD-10-CM

## 2019-08-13 LAB — CBC WITH DIFFERENTIAL/PLATELET
Basophils Absolute: 0.1 10*3/uL (ref 0.0–0.2)
Basos: 1 %
EOS (ABSOLUTE): 0.2 10*3/uL (ref 0.0–0.4)
Eos: 4 %
Hematocrit: 39.8 % (ref 34.0–46.6)
Hemoglobin: 12.6 g/dL (ref 11.1–15.9)
Immature Grans (Abs): 0 10*3/uL (ref 0.0–0.1)
Immature Granulocytes: 0 %
Lymphocytes Absolute: 1.7 10*3/uL (ref 0.7–3.1)
Lymphs: 33 %
MCH: 29.2 pg (ref 26.6–33.0)
MCHC: 31.7 g/dL (ref 31.5–35.7)
MCV: 92 fL (ref 79–97)
Monocytes Absolute: 0.4 10*3/uL (ref 0.1–0.9)
Monocytes: 8 %
Neutrophils Absolute: 2.7 10*3/uL (ref 1.4–7.0)
Neutrophils: 54 %
Platelets: 278 10*3/uL (ref 150–450)
RBC: 4.31 x10E6/uL (ref 3.77–5.28)
RDW: 12.6 % (ref 11.7–15.4)
WBC: 5.1 10*3/uL (ref 3.4–10.8)

## 2019-08-13 MED ORDER — TRAZODONE HCL 50 MG PO TABS: 25.0000 mg | ORAL_TABLET | Freq: Every evening | ORAL | 3 refills | Status: DC | PRN

## 2019-08-13 MED ORDER — ESCITALOPRAM OXALATE 20 MG PO TABS: 40.0000 mg | ORAL_TABLET | Freq: Every day | ORAL | 1 refills | Status: DC

## 2019-08-13 NOTE — Progress Notes (Signed)
Subjective:  Patient ID: Laurie Howell, female    DOB: September 14, 1976, 43 y.o.   MRN: [id number]  Patient Care Team: Baruch Gouty, FNP as PCP - General (Family Medicine) Rakes, Connye Burkitt, FNP (Family Medicine)   Chief Complaint:  Menorrhagia   HPI: Laurie Howell is a 43 y.o. female presenting on 08/13/2019 for Menorrhagia  1. DUB (dysfunctional uterine bleeding)  Pt presents today for ongoing DUB and menorrhagia. This has been an ongoing problem since pt had her tubal ligation reversal. Pt states she was going to [facility], but they are recommending a hysterectomy and she does not want this at this time. She is trying to have another child. Pt states her cycle will last 2-3 months at a time. Sometimes heavy, sometimes normal. She does pass clots at times. She denies vaginal bleeding today. States it subsided yesterday. No dizziness, chest pain, palpitations, pallor, shortness of breath, or syncope.    2. GAD (generalized anxiety disorder)   3. Depression, recurrent ([place])  Ongoing and worsening symptoms due to DUB, going through a divorce, and trying to conceive. No SI or HI. Has been compliant with medications without associated side effects but feels she may need to change it or increase the dose.   GAD 7 : Generalized Anxiety Score 08/13/2019  Nervous, Anxious, on Edge 3  Control/stop worrying 3  Worry too much - different things 3  Trouble relaxing 1  Restless 0  Easily annoyed or irritable 3  Afraid - awful might happen 0  Total GAD 7 Score 13    Depression screen [facility] 2/9 08/13/2019 06/10/2019 12/08/2018 12/07/2018 11/02/2018  Decreased Interest 0 0 - 0 0  Down, Depressed, Hopeless 0 0 0 0 0  PHQ - 2 Score 0 0 0 0 0  Altered sleeping - 0 0 - -  Tired, decreased energy - 0 - - -  Change in appetite - 0 - - -  Feeling bad or failure about yourself  - 0 - - -  Trouble concentrating - 0 - - -  Moving slowly or fidgety/restless - 0 - - -  Suicidal thoughts - 0 - - -   PHQ-9 Score - 0 0 - -  Difficult doing work/chores - - - - -  Some recent data might be hidden     4. Chronic idiopathic constipation  Ongoing constipation for several years, since reversal of tubal ligation. Does have to take something to stimulate her bowels. States she can go several weeks without having a BM. She has tried Miralax, Linzess, and other stimulants in the past without relief of symptoms. States they would just make her bloat and feel miserable. No hematochezia or melena. Would like referral to GI. Would not like to try medications for IBS constipation type.    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 Has tried several over the counter medications without relief of symptoms. Has taken Melatonin without relief. Has a hard time falling asleep and staying asleep. Can go for several days without sleeping. Followed by Dr. Dorris Fetch for hyperthyroidism and last TSH was normal in 06/2019.      Relevant past medical, surgical, family, and social history reviewed and updated as indicated.  Allergies and medications reviewed and updated. Date reviewed: Chart in Epic.   Past Medical History:  Diagnosis Date  . Anemia   . Esophageal stricture   . Fibroid 06/18/2017  . Frequent UTI   .  GERD (gastroesophageal reflux disease)   . Headache   . Iron deficiency anemia 03/28/2016    Past Surgical History:  Procedure Laterality Date  . ESOPHAGOGASTRODUODENOSCOPY N/A 06/27/2016   Procedure: ESOPHAGOGASTRODUODENOSCOPY (EGD);  Surgeon: Danie Binder, MD;  [facility];  Service: Endoscopy;  Laterality: N/A;  1245  . SAVORY DILATION N/A 06/27/2016   Procedure: SAVORY DILATION;  Surgeon: Danie Binder, MD;  [facility];  Service: Endoscopy;  Laterality: N/A;  . TUBAL LIGATION  1997  . Tubal reversal  02/2015   [facility] - Dr Karie Kirks    Social History   Socioeconomic History  . Marital status: Legally Separated    Spouse name: Not on  file  . Number of children: 2  . Years of education: 8th  . Highest education level: Not on file  Occupational History  . Occupation: N/A  Social Needs  . Financial resource strain: Not on file  . Food insecurity    Worry: Not on file    Inability: Not on file  . Transportation needs    Medical: Not on file    Non-medical: Not on file  Tobacco Use  . Smoking status: Former Smoker    Years: 1.00    Types: Cigarettes    Quit date: 12/30/1994    Years since quitting: 24.6  . Smokeless tobacco: Never Used  Substance and Sexual Activity  . Alcohol use: No  . Drug use: No  . Sexual activity: Yes    Birth control/protection: Injection    Comment: tubal reversed  Lifestyle  . Physical activity    Days per week: Not on file    Minutes per session: Not on file  . Stress: Not on file  Relationships  . Social Herbalist on phone: Not on file    Gets together: Not on file    Attends religious service: Not on file    Active member of club or organization: Not on file    Attends meetings of clubs or organizations: Not on file    Relationship status: Not on file  . Intimate partner violence    Fear of current or ex partner: Not on file    Emotionally abused: Not on file    Physically abused: Not on file    Forced sexual activity: Not on file  Other Topics Concern  . Not on file  Social History Narrative  . Not on file    Outpatient Encounter Medications as of 08/13/2019  Medication Sig  . [DISCONTINUED] escitalopram (LEXAPRO) 20 MG tablet Take 1 tablet (20 mg total) by mouth daily.  Marland Kitchen escitalopram (LEXAPRO) 20 MG tablet Take 2 tablets (40 mg total) by mouth daily.  . traZODone (DESYREL) 50 MG tablet Take 0.5-1 tablets (25-50 mg total) by mouth at bedtime as needed for sleep.   No facility-administered encounter medications on file as of 08/13/2019.     Allergies  Allergen Reactions  . Ivp Dye [Iodinated Diagnostic Agents] Anaphylaxis  . Shellfish Allergy  Anaphylaxis  . Aspirin Swelling and Other (See Comments)    Reaction:  Facial swelling     Review of Systems  Constitutional: Negative for activity change, appetite change, chills, diaphoresis, fatigue, fever and unexpected weight change.  HENT: Negative.   Eyes: Negative.  Negative for photophobia and visual disturbance.  Respiratory: Negative for cough, chest tightness and shortness of breath.   Cardiovascular: Negative for chest pain, palpitations and leg swelling.  Gastrointestinal: Positive for abdominal  distention and constipation. Negative for abdominal pain, anal bleeding, blood in stool, diarrhea, nausea, rectal pain and vomiting.  Endocrine: Negative.  Negative for cold intolerance, heat intolerance, polydipsia, polyphagia and polyuria.  Genitourinary: Positive for menstrual problem and vaginal bleeding. Negative for decreased urine volume, difficulty urinating, dyspareunia, dysuria, enuresis, flank pain, frequency, genital sores, hematuria, pelvic pain, urgency, vaginal discharge and vaginal pain.  Musculoskeletal: Negative for arthralgias, back pain and myalgias.  Skin: Negative.  Negative for color change and pallor.  Allergic/Immunologic: Negative.   Neurological: Negative for dizziness, tremors, seizures, syncope, facial asymmetry, speech difficulty, weakness, light-headedness, numbness and headaches.  Hematological: Negative.  Does not bruise/bleed easily.  Psychiatric/Behavioral: Positive for behavioral problems, decreased concentration, dysphoric mood and sleep disturbance. Negative for agitation, confusion, hallucinations, self-injury and suicidal ideas. The patient is nervous/anxious. The patient is not hyperactive.   All other systems reviewed and are negative.       Objective:  BP 100/74   Pulse 89   Temp 99.6 F (37.6 C) (Oral)   Ht 5\' 3"  (1.6 m)   Wt 139 lb (63 kg)   BMI 24.62 kg/m    Wt Readings from Last 3 Encounters:  08/13/19 139 lb (63 kg)  07/29/19  135 lb (61.2 kg)  07/22/19 137 lb (62.1 kg)    Physical Exam Vitals signs and nursing note reviewed.  Constitutional:      General: She is not in acute distress.    Appearance: Normal appearance. She is well-developed and well-groomed. She is not ill-appearing, toxic-appearing or diaphoretic.  HENT:     Head: Normocephalic and atraumatic.     Jaw: There is normal jaw occlusion.     Right Ear: Hearing normal.     Left Ear: Hearing normal.     Nose: Nose normal.     Mouth/Throat:     Lips: Pink.     Mouth: Mucous membranes are moist.     Pharynx: Oropharynx is clear. Uvula midline.  Eyes:     General: Lids are normal.     Extraocular Movements: Extraocular movements intact.     Conjunctiva/sclera: Conjunctivae normal.     Pupils: Pupils are equal, round, and reactive to light.  Neck:     Musculoskeletal: Normal range of motion and neck supple.     Thyroid: No thyroid mass, thyromegaly or thyroid tenderness.     Vascular: No carotid bruit or JVD.     Trachea: Trachea and phonation normal.  Cardiovascular:     Rate and Rhythm: Normal rate and regular rhythm.     Chest Wall: PMI is not displaced.     Pulses: Normal pulses.     Heart sounds: Normal heart sounds. No murmur. No friction rub. No gallop.   Pulmonary:     Effort: Pulmonary effort is normal. No respiratory distress.     Breath sounds: Normal breath sounds. No wheezing.  Abdominal:     General: Bowel sounds are normal. There is no distension or abdominal bruit.     Palpations: Abdomen is soft. There is no hepatomegaly or splenomegaly.     Tenderness: There is no abdominal tenderness. There is no right CVA tenderness or left CVA tenderness.     Hernia: No hernia is present.  Genitourinary:    Comments: Declined pelvic exam today, not actively bleeding.  Musculoskeletal: Normal range of motion.     Right lower leg: No edema.     Left lower leg: No edema.  Lymphadenopathy:     Cervical:  No cervical adenopathy.  Skin:     General: Skin is warm and dry.     Capillary Refill: Capillary refill takes less than 2 seconds.     Coloration: Skin is not cyanotic, jaundiced or pale.     Findings: No rash.  Neurological:     General: No focal deficit present.     Mental Status: She is alert and oriented to person, place, and time.     Cranial Nerves: Cranial nerves are intact.     Sensory: Sensation is intact.     Motor: Motor function is intact.     Coordination: Coordination is intact.     Gait: Gait is intact.     Deep Tendon Reflexes: Reflexes are normal and symmetric.  Psychiatric:        Attention and Perception: Attention and perception normal.        Mood and Affect: Affect normal. Mood is anxious.        Speech: Speech normal.        Behavior: Behavior normal. Behavior is cooperative.        Thought Content: Thought content normal.        Cognition and Memory: Cognition and memory normal.        Judgment: Judgment normal.     Results for orders placed or performed in visit on 07/23/19  TSH  Result Value Ref Range   TSH 0.718 0.450 - 4.500 uIU/mL  T4, Free  Result Value Ref Range   Free T4 0.94 0.82 - 1.77 ng/dL  T3, Free  Result Value Ref Range   T3, Free 3.4 2.0 - 4.4 pg/mL  Thyroid Peroxidase Antibody  Result Value Ref Range   Thyroperoxidase Ab SerPl-aCnc 14 0 - 34 IU/mL  Thyroglobulin antibody  Result Value Ref Range   Thyroglobulin Antibody <1.0 0.0 - 0.9 IU/mL  Cortisol-am, blood  Result Value Ref Range   Cortisol - AM 20.1 (H) 6.2 - 19.4 ug/dL  Hemoglobin A1c  Result Value Ref Range   Hgb A1c MFr Bld 5.2 4.8 - 5.6 %   Est. average glucose Bld gHb Est-mCnc 103 mg/dL  Comprehensive metabolic panel  Result Value Ref Range   Glucose 92 65 - 99 mg/dL   BUN 11 6 - 24 mg/dL   Creatinine, Ser 0.73 0.57 - 1.00 mg/dL   GFR calc non Af Amer 101 >59 mL/min/1.73   GFR calc Af Amer 117 >59 mL/min/1.73   BUN/Creatinine Ratio 15 9 - 23   Sodium 138 134 - 144 mmol/L   Potassium 4.1 3.5  - 5.2 mmol/L   Chloride 102 96 - 106 mmol/L   CO2 21 20 - 29 mmol/L   Calcium 9.6 8.7 - 10.2 mg/dL   Total Protein 6.9 6.0 - 8.5 g/dL   Albumin 4.3 3.8 - 4.8 g/dL   Globulin, Total 2.6 1.5 - 4.5 g/dL   Albumin/Globulin Ratio 1.7 1.2 - 2.2   Bilirubin Total 0.3 0.0 - 1.2 mg/dL   Alkaline Phosphatase 84 39 - 117 IU/L   AST 13 0 - 40 IU/L   ALT 8 0 - 32 IU/L  VITAMIN D 25 Hydroxy (Vit-D Deficiency, Fractures)  Result Value Ref Range   Vit D, 25-Hydroxy 47.9 30.0 - 100.0 ng/mL       Pertinent labs & imaging results that were available during my care of the patient were reviewed by me and considered in my medical decision making.  Assessment & Plan:  Yissel was seen today for  menorrhagia.  Diagnoses and all orders for this visit:  DUB (dysfunctional uterine bleeding) Ongoing DUB and menorrhagia. Was not happy with [facility]. Will place new referral today. Has not had vaginal [place], will order today while awaiting new OB/GYN referral. Will check CBC today.  -     Ambulatory referral to Obstetrics / Gynecology -     US Transvaginal Non-OB; Future -     CBC with Differential/Platelet  GAD (generalized anxiety disorder) Depression, recurrent ([facility]) Due to ongoing and worsening symptoms, will increase Lexapro today. Report any new or worsening symptoms. Follow up in 4-6 weeks for reevaluation.  -     escitalopram (LEXAPRO) 20 MG tablet; Take 2 tablets (40 mg total) by mouth daily.  Chronic idiopathic constipation Ongoing for several years. No red flags. Declines IBS constipation type therapy. Would like referral to GI, referral placed.  -     Ambulatory referral to Gastroenterology  Insomnia due to medical condition Ongoing and worsening due to increased anxiety. Has failed over the counter remedies. Sleep hygiene discussed. Will trial below. Start out with 25 mg and titrate up to 50 mg if needed. Report any new or worsening symptoms. Follow up in 4-6 weeks for reevaluation.  -      traZODone (DESYREL) 50 MG tablet; Take 0.5-1 tablets (25-50 mg total) by mouth at bedtime as needed for sleep.     Continue all other maintenance medications.  Follow up plan: Return in about 6 weeks (around 09/24/2019), or if symptoms worsen or fail to improve, for insomnia, GAD.  Continue healthy lifestyle choices, including diet (rich in fruits, vegetables, and lean proteins, and low in salt and simple carbohydrates) and exercise (at least 30 minutes of moderate physical activity daily).  Educational handout given for insomnia  The above assessment and management plan was discussed with the patient. The patient verbalized understanding of and has agreed to the management plan. Patient is aware to call the clinic if symptoms persist or worsen. Patient is aware when to return to the clinic for a follow-up visit. Patient educated on when it is appropriate to go to the emergency department.   Monia Pouch, FNP-C [facility] [phone] 08/13/19

## 2019-08-13 NOTE — Patient Instructions (Signed)

## 2019-08-18 ENCOUNTER — Ambulatory Visit (INDEPENDENT_AMBULATORY_CARE_PROVIDER_SITE_OTHER): Payer: Medicaid Other | Admitting: Family Medicine

## 2019-08-18 ENCOUNTER — Telehealth: Payer: Self-pay | Admitting: Family Medicine

## 2019-08-18 DIAGNOSIS — N3000 Acute cystitis without hematuria: Secondary | ICD-10-CM | POA: Diagnosis not present

## 2019-08-18 MED ORDER — CEPHALEXIN 500 MG PO CAPS: 500.0000 mg | ORAL_CAPSULE | Freq: Two times a day (BID) | ORAL | 0 refills | Status: AC

## 2019-08-18 NOTE — Progress Notes (Signed)
Telephone visit  Subjective: CC: UTI PCP: Baruch Gouty, FNP YQM:VHQIONG Laurie Howell is a 43 y.o. female calls for telephone consult today. Patient provides verbal consent for consult held via phone.  Location of patient: home Location of provider: [facility] Others present for call: none  1. Urinary symptoms Patient reports a 3 day h/o back pain and dysuria .  Reports urinary frequency, urgency. Denies hematuria, fevers, chills, abdominal pain, nausea, vomiting, vaginal discharge.  Patient has used AZO for symptoms.  Patient denies a h/o frequent or recurrent UTIs.  LMP about 2-3 weeks ago.   ROS: Per HPI  Allergies  Allergen Reactions  . Ivp Dye [Iodinated Diagnostic Agents] Anaphylaxis  . Shellfish Allergy Anaphylaxis  . Aspirin Swelling and Other (See Comments)    Reaction:  Facial swelling    Past Medical History:  Diagnosis Date  . Anemia   . Esophageal stricture   . Fibroid 06/18/2017  . Frequent UTI   . GERD (gastroesophageal reflux disease)   . Headache   . Iron deficiency anemia 03/28/2016    Current Outpatient Medications:  .  escitalopram (LEXAPRO) 20 MG tablet, Take 2 tablets (40 mg total) by mouth daily., Disp: 180 tablet, Rfl: 1 .  traZODone (DESYREL) 50 MG tablet, Take 0.5-1 tablets (25-50 mg total) by mouth at bedtime as needed for sleep., Disp: 30 tablet, Rfl: 3  Assessment/ Plan: 43 y.o. female   1. Acute cystitis without hematuria Empiric treatment for acute UTI sent to the pharmacy.  We discussed home care instructions including increasing fluids.  If no significant improvement by Friday, I advised her to come to the office so that we can get a urine sample.  We discussed reasons for emergent evaluation.  She will follow-up PRN.  Meds ordered this encounter  Medications  . cephALEXin (KEFLEX) 500 MG capsule    Sig: Take 1 capsule (500 mg total) by mouth 2 (two) times daily for 7 days.    Dispense:  14 capsule    Refill:  0    Start time: 2:12pm End time:  2:16pm  Total time spent on patient care (including telephone call/ virtual visit): 10 minutes  [place], [place] [phone]

## 2019-08-18 NOTE — Patient Instructions (Signed)
Urinary Tract Infection, Adult A urinary tract infection (UTI) is an infection of any part of the urinary tract. The urinary tract includes:  The kidneys.  The ureters.  The bladder.  The urethra. These organs make, store, and get rid of pee (urine) in the body. What are the causes? This is caused by germs (bacteria) in your genital area. These germs grow and cause swelling (inflammation) of your urinary tract. What increases the risk? You are more likely to develop this condition if:  You have a small, thin tube (catheter) to drain pee.  You cannot control when you pee or poop (incontinence).  You are female, and: ? You use these methods to prevent pregnancy: ? A medicine that kills sperm (spermicide). ? A device that blocks sperm (diaphragm). ? You have low levels of a female hormone (estrogen). ? You are pregnant.  You have genes that add to your risk.  You are sexually active.  You take antibiotic medicines.  You have trouble peeing because of: ? A prostate that is bigger than normal, if you are female. ? A blockage in the part of your body that drains pee from the bladder (urethra). ? A kidney stone. ? A nerve condition that affects your bladder (neurogenic bladder). ? Not getting enough to drink. ? Not peeing often enough.  You have other conditions, such as: ? Diabetes. ? A weak disease-fighting system (immune system). ? Sickle cell disease. ? Gout. ? Injury of the spine. What are the signs or symptoms? Symptoms of this condition include:  Needing to pee right away (urgently).  Peeing often.  Peeing small amounts often.  Pain or burning when peeing.  Blood in the pee.  Pee that smells bad or not like normal.  Trouble peeing.  Pee that is cloudy.  Fluid coming from the vagina, if you are female.  Pain in the belly or lower back. Other symptoms include:  Throwing up (vomiting).  No urge to eat.  Feeling mixed up (confused).  Being tired  and grouchy (irritable).  A fever.  Watery poop (diarrhea). How is this treated? This condition may be treated with:  Antibiotic medicine.  Other medicines.  Drinking enough water. Follow these instructions at home:  Medicines  Take over-the-counter and prescription medicines only as told by your doctor.  If you were prescribed an antibiotic medicine, take it as told by your doctor. Do not stop taking it even if you start to feel better. General instructions  Make sure you: ? Pee until your bladder is empty. ? Do not hold pee for a long time. ? Empty your bladder after sex. ? Wipe from front to back after pooping if you are a female. Use each tissue one time when you wipe.  Drink enough fluid to keep your pee pale yellow.  Keep all follow-up visits as told by your doctor. This is important. Contact a doctor if:  You do not get better after 1-2 days.  Your symptoms go away and then come back. Get help right away if:  You have very bad back pain.  You have very bad pain in your lower belly.  You have a fever.  You are sick to your stomach (nauseous).  You are throwing up. Summary  A urinary tract infection (UTI) is an infection of any part of the urinary tract.  This condition is caused by germs in your genital area.  There are many risk factors for a UTI. These include having a small, thin   tube to drain pee and not being able to control when you pee or poop.  Treatment includes antibiotic medicines for germs.  Drink enough fluid to keep your pee pale yellow. This information is not intended to replace advice given to you by your health care provider. Make sure you discuss any questions you have with your health care provider. Document Released: 06/03/2008 Document Revised: 12/03/2018 Document Reviewed: 06/25/2018 Elsevier Patient Education  2020 Elsevier Inc.  

## 2019-08-30 ENCOUNTER — Other Ambulatory Visit: Payer: Self-pay

## 2019-08-30 ENCOUNTER — Ambulatory Visit (HOSPITAL_COMMUNITY)
Admission: RE | Admit: 2019-08-30 | Discharge: 2019-08-30 | Disposition: A | Discharge status: Home / Self Care | Payer: Medicaid Other | Source: Ambulatory Visit | Attending: Family Medicine | Admitting: Family Medicine

## 2019-08-30 DIAGNOSIS — N938 Other specified abnormal uterine and vaginal bleeding: Secondary | ICD-10-CM | POA: Diagnosis not present

## 2019-08-30 DIAGNOSIS — N939 Abnormal uterine and vaginal bleeding, unspecified: Secondary | ICD-10-CM | POA: Diagnosis not present

## 2019-09-01 ENCOUNTER — Other Ambulatory Visit: Payer: Self-pay

## 2019-09-01 ENCOUNTER — Ambulatory Visit (INDEPENDENT_AMBULATORY_CARE_PROVIDER_SITE_OTHER): Payer: Medicaid Other | Admitting: Certified Nurse Midwife

## 2019-09-01 ENCOUNTER — Encounter: Payer: Self-pay | Admitting: Certified Nurse Midwife

## 2019-09-01 VITALS — BP 101/71 | HR 83 | Ht 63.0 in | Wt 136.5 lb

## 2019-09-01 DIAGNOSIS — N939 Abnormal uterine and vaginal bleeding, unspecified: Secondary | ICD-10-CM

## 2019-09-01 DIAGNOSIS — N979 Female infertility, unspecified: Secondary | ICD-10-CM | POA: Diagnosis not present

## 2019-09-01 NOTE — Progress Notes (Signed)
GYN ENCOUNTER NOTE  Subjective:       Laurie Howell is a 43 y.o. [phone] female is here for gynecologic evaluation of the following issues:  1.Menorahage since having a tubal reversal in 2016. She state she will have episodes of bleeding lasting 3-4 months at a time. She has iron deficiency anemia since her surgery that has resulted in iron transfusions.  She complains of weight gain (26 lbs) and bloating since her surgery. She states she has been trying to conceive since her tubal reversal surgery. She has 2 children 60 & 67 yr old with the same partner . She denies having any infertility consults and denies any testing of her partner.    Gynecologic History Patient's last menstrual period was 08/18/2019. Contraception: none Last Pap: 2017 Results were: normal Last mammogram: unknown  Obstetric History OB History  Gravida Para Term Preterm AB Living  6 2 2   4 2   SAB TAB Ectopic Multiple Live Births  4       2    # Outcome Date GA Lbr Len/2nd Weight Sex Delivery Anes PTL Lv  6 SAB 04/30/98 [redacted]w[redacted]d       FD  5 Term 05/25/97 [redacted]w[redacted]d  6 lb 11 oz (3.033 kg) M Vag-Spont  N LIV  4 Term 05/21/96 [redacted]w[redacted]d  7 lb 7.1 oz (3.376 kg) M Vag-Spont  N LIV  3 SAB 04/30/96        FD  2 SAB 05/01/95        FD  1 SAB 04/30/94        FD    Past Medical History:  Diagnosis Date  . Anemia   . Esophageal stricture   . Fibroid 06/18/2017  . Frequent UTI   . GERD (gastroesophageal reflux disease)   . Headache   . Iron deficiency anemia 03/28/2016    Past Surgical History:  Procedure Laterality Date  . ESOPHAGOGASTRODUODENOSCOPY N/A 06/27/2016   Procedure: ESOPHAGOGASTRODUODENOSCOPY (EGD);  Surgeon: Danie Binder, MD;  Location: [facility];  Service: Endoscopy;  Laterality: N/A;  1245  . SAVORY DILATION N/A 06/27/2016   Procedure: SAVORY DILATION;  Surgeon: Danie Binder, MD;  Location: [facility];  Service: Endoscopy;  Laterality: N/A;  . TUBAL LIGATION  1997  . Tubal reversal  02/2015   [facility] -  Dr Karie Kirks    Current Outpatient Medications on File Prior to Visit  Medication Sig Dispense Refill  . escitalopram (LEXAPRO) 20 MG tablet Take 2 tablets (40 mg total) by mouth daily. 180 tablet 1  . traZODone (DESYREL) 50 MG tablet Take 0.5-1 tablets (25-50 mg total) by mouth at bedtime as needed for sleep. 30 tablet 3   No current facility-administered medications on file prior to visit.     Allergies  Allergen Reactions  . Ivp Dye [Iodinated Diagnostic Agents] Anaphylaxis  . Shellfish Allergy Anaphylaxis  . Aspirin Swelling and Other (See Comments)    Reaction:  Facial swelling     Social History   Socioeconomic History  . Marital status: Legally Separated    Spouse name: Not on file  . Number of children: 2  . Years of education: 8th  . Highest education level: Not on file  Occupational History  . Occupation: N/A  Social Needs  . Financial resource strain: Not on file  . Food insecurity    Worry: Not on file    Inability: Not on file  . Transportation needs    Medical: Not on  file    Non-medical: Not on file  Tobacco Use  . Smoking status: Former Smoker    Years: 1.00    Types: Cigarettes    Quit date: 12/30/1994    Years since quitting: 24.6  . Smokeless tobacco: Never Used  Substance and Sexual Activity  . Alcohol use: No  . Drug use: No  . Sexual activity: Yes    Birth control/protection: None    Comment: tubal reversed  Lifestyle  . Physical activity    Days per week: Not on file    Minutes per session: Not on file  . Stress: Not on file  Relationships  . Social Herbalist on phone: Not on file    Gets together: Not on file    Attends religious service: Not on file    Active member of club or organization: Not on file    Attends meetings of clubs or organizations: Not on file    Relationship status: Not on file  . Intimate partner violence    Fear of current or ex partner: Not on file    Emotionally abused: Not on file    Physically  abused: Not on file    Forced sexual activity: Not on file  Other Topics Concern  . Not on file  Social History Narrative  . Not on file    Family History  Problem Relation Age of Onset  . Depression Mother   . Hypertension Mother   . Other Mother        open heart surgery  . Heart disease Father   . Hypertension Father   . Anxiety disorder Father   . Heart attack Father   . Stroke Maternal Grandmother   . Colon cancer Neg Hx     The following portions of the patient's history were reviewed and updated as appropriate: allergies, current medications, past family history, past medical history, past social history, past surgical history and problem list.  Review of Systems Review of Systems - Negative except as mentioned in HPI Review of Systems - General ROS: negative for - chills, fatigue, fever, hot flashes, malaise or night sweats Hematological and Lymphatic ROS: negative for - bleeding problems or swollen lymph nodes Gastrointestinal ROS: negative for - abdominal pain, blood in stools, change in bowel habits and nausea/vomiting Musculoskeletal ROS: negative for - joint pain, muscle pain or muscular weakness Genito-Urinary ROS: negative for -l cycle, dysmenorrhea, dyspareunia, dysuria, genital discharge, genital ulcers, hematuria, incontinence, irregular, nocturia or pelvic pain. Positive for change in menstrual cycle and heavy menses  Objective:   BP 101/71   Pulse 83   Ht 5\' 3"  (1.6 m)   Wt 136 lb 8 oz (61.9 kg)   LMP 08/18/2019   BMI 24.18 kg/m  CONSTITUTIONAL: Well-developed, well-nourished female in no acute distress.  HENT:  Normocephalic, atraumatic.  NECK: Normal range of motion, supple, no masses.  Normal thyroid.  SKIN: Skin is warm and dry. No rash noted. Not diaphoretic. No erythema. No pallor. [place]: Alert and oriented to person, place, and time.  PSYCHIATRIC: Normal mood and affect. Normal behavior. Normal judgment and thought  content. CARDIOVASCULAR:RRR RESPIRATORY: Clear bilaterally BREASTS: Not Examined ABDOMEN: Soft, distended; Non tender.  No Organomegaly. PELVIC:not indicated to discuss treatment options. MUSCULOSKELETAL: Normal range of motion. No tenderness.  No cyanosis, clubbing, or edema.  CLINICAL DATA:  Dysfunctional uterine bleeding  EXAM: ULTRASOUND PELVIS TRANSVAGINAL  TECHNIQUE: Transvaginal ultrasound examination of the pelvis was performed including evaluation of the  uterus, ovaries, adnexal regions, and pelvic cul-de-sac. Transabdominal imaging was not ordered.  COMPARISON:  By 06/19/2015  FINDINGS: Uterus  Measurements: 10.4 x 4.0 x 4.8 cm = volume: 106 mL. Anteverted. Questionable tiny submucosal leiomyoma at inferior uterine segment 10 x 9 x 10 mm on LEFT. No additional masses  Endometrium  Thickness: 4 mm, normal.  No additional abnormalities  Right ovary  Measurements: 2.3 x 2.1 x 2.4 cm = volume: 6.1 mL. Normal morphology without mass  Left ovary  Measurements: 2.0 x 1.8 x 1.4 cm = volume: 2.7 mL. Normal morphology without mass  Other findings:  No free pelvic fluid.  No adnexal masses.  IMPRESSION: Questionable tiny submucosal leiomyoma at inferior uterine segment 10 mm diameter versus small amount of complicated endometrial fluid.  Otherwise negative exam.   Electronically Signed   By: Lavonia Dana M.D.   On: 08/30/2019 14:49 Assessment:   1. Abnormal uterine bleeding - Prolactin; Future     Plan:   Discussed irregular bleeding and caused. Reviewed typical treatment options of hormonal methods ( birth control). Given that the patient is still wanting to conceive she declines birth control. Pt state she has done ovulation kits on and off since tubal reversal which suggests that she is ovulating. Encourage pt partner to have semen analysis.  Discussed infertility consult. She agrees to plan. Labs : she has had CBC , TSH, vitamin D,  Hemoglobin AIC this year. Recommend fasting prolactin.  Consulted Dr. Ellard Artis on pt care plan.   I attest more than 50% of visit spent reviewing history , labs, u/s . Discussing abnormal uterine bleeding and treatment options. Discussing infertility and recommendations. Consulting with attending MD and answering all of her questions.  Face to face time 20 min.   Philip Aspen, CNM

## 2019-09-01 NOTE — Patient Instructions (Signed)
Abnormal Uterine Bleeding °Abnormal uterine bleeding means bleeding more than usual from your uterus. It can include: °· Bleeding between periods. °· Bleeding after sex. °· Bleeding that is heavier than normal. °· Periods that last longer than usual. °· Bleeding after you have stopped having your period (menopause). °There are many problems that may cause this. You should see a doctor for any kind of bleeding that is not normal. Treatment depends on the cause of the bleeding. °Follow these instructions at home: °· Watch your condition for any changes. °· Do not use tampons, douche, or have sex, if your doctor tells you not to. °· Change your pads often. °· Get regular well-woman exams. Make sure they include a pelvic exam and cervical cancer screening. °· Keep all follow-up visits as told by your doctor. This is important. °Contact a doctor if: °· The bleeding lasts more than one week. °· You feel dizzy at times. °· You feel like you are going to throw up (nauseous). °· You throw up. °Get help right away if: °· You pass out. °· You have to change pads every hour. °· You have belly (abdominal) pain. °· You have a fever. °· You get sweaty. °· You get weak. °· You passing large blood clots from your vagina. °Summary °· Abnormal uterine bleeding means bleeding more than usual from your uterus. °· There are many problems that may cause this. You should see a doctor for any kind of bleeding that is not normal. °· Treatment depends on the cause of the bleeding. °This information is not intended to replace advice given to you by your health care provider. Make sure you discuss any questions you have with your health care provider. °Document Released: 10/13/2009 Document Revised: 12/10/2016 Document Reviewed: 12/10/2016 °Elsevier Patient Education © 2020 Elsevier Inc. ° °

## 2019-09-04 DIAGNOSIS — N939 Abnormal uterine and vaginal bleeding, unspecified: Secondary | ICD-10-CM | POA: Diagnosis not present

## 2019-09-04 DIAGNOSIS — N920 Excessive and frequent menstruation with regular cycle: Secondary | ICD-10-CM | POA: Diagnosis not present

## 2019-09-07 ENCOUNTER — Other Ambulatory Visit: Payer: Self-pay

## 2019-09-07 ENCOUNTER — Telehealth: Payer: Self-pay

## 2019-09-07 ENCOUNTER — Other Ambulatory Visit: Payer: Medicaid Other

## 2019-09-07 DIAGNOSIS — N979 Female infertility, unspecified: Secondary | ICD-10-CM | POA: Diagnosis not present

## 2019-09-07 DIAGNOSIS — N939 Abnormal uterine and vaginal bleeding, unspecified: Secondary | ICD-10-CM

## 2019-09-07 NOTE — Progress Notes (Unsigned)
orders

## 2019-09-07 NOTE — Telephone Encounter (Signed)
Pt started bleeding heavy again. Would like a call back.

## 2019-09-07 NOTE — Telephone Encounter (Signed)
mychart message sent

## 2019-09-08 LAB — PROLACTIN: Prolactin: 14.8 ng/mL (ref 4.8–23.3)

## 2019-09-08 NOTE — Telephone Encounter (Signed)
This patient has heavy bleeding but is trying to get pregnant. The options I offered her was [facility] and consulted Dr. Amalia Hailey regarding plan of care. He agreed I put in a referral to  Discuss infertility treatment. We could try a round of lysteda if she is interested but it seemed like cost was a factor for her. The patient needs to decide if she wants to pursue getting pregnant or if she would like to try a hormonal method to control her bleeding. Not anything else that I can do if she is still trying to conceive other than the lysteda. Of course she is always welcome to see Dr. Amalia Hailey for a second opinion.   Thanks,  Deneise Lever

## 2019-09-27 ENCOUNTER — Encounter: Payer: Self-pay | Admitting: Physician Assistant

## 2019-09-27 ENCOUNTER — Other Ambulatory Visit: Payer: Self-pay

## 2019-09-27 ENCOUNTER — Ambulatory Visit: Payer: Medicaid Other | Admitting: Physician Assistant

## 2019-09-27 VITALS — BP 123/80 | HR 102 | Temp 98.4°F | Ht 63.0 in | Wt 136.4 lb

## 2019-09-27 DIAGNOSIS — N3000 Acute cystitis without hematuria: Secondary | ICD-10-CM

## 2019-09-27 DIAGNOSIS — K30 Functional dyspepsia: Secondary | ICD-10-CM

## 2019-09-27 DIAGNOSIS — R300 Dysuria: Secondary | ICD-10-CM

## 2019-09-27 LAB — URINALYSIS, COMPLETE
Bilirubin, UA: NEGATIVE
Glucose, UA: NEGATIVE
Ketones, UA: NEGATIVE
Nitrite, UA: POSITIVE — AB
Specific Gravity, UA: 1.02 (ref 1.005–1.030)
Urobilinogen, Ur: 0.2 mg/dL (ref 0.2–1.0)
pH, UA: 6 (ref 5.0–7.5)

## 2019-09-27 LAB — MICROSCOPIC EXAMINATION: WBC, UA: >30 /hpf — AB (ref 0–5)

## 2019-09-27 MED ORDER — OMEPRAZOLE 20 MG PO CPDR: 20.0000 mg | DELAYED_RELEASE_CAPSULE | Freq: Every day | ORAL | 0 refills | Status: DC

## 2019-09-27 MED ORDER — CEPHALEXIN 500 MG PO CAPS: 500.0000 mg | ORAL_CAPSULE | Freq: Two times a day (BID) | ORAL | 0 refills | Status: DC

## 2019-09-27 NOTE — Progress Notes (Signed)
BP 123/80   Pulse (!) 102   Temp 98.4 F (36.9 C) (Temporal)   Ht 5\' 3"  (1.6 m)   Wt 136 lb 6.4 oz (61.9 kg)   LMP 09/13/2019   SpO2 96%   BMI 24.16 kg/m    Subjective:    Patient ID: Laurie Howell, female    DOB: 1976-09-09, 43 y.o.   MRN: [id number]  HPI: Laurie Howell is a 43 y.o. female presenting on 09/27/2019 for Urinary Tract Infection  This patient has had several weeks of extra acid and gas on her stomach.  She is burping a lot.  She is tried multiple over-the-counter medications without any help.  She states she has never taken omeprazole in the past.  We will give her prescription for 1 month and if things do not get better she is to let her PCP know.  This patient has had several days of dysuria, frequency and nocturia. There is also pain over the bladder in the suprapubic region, no back pain. Denies leakage or hematuria.  Denies fever or chills. No pain in flank area.   Past Medical History:  Diagnosis Date  . Anemia   . Esophageal stricture   . Fibroid 06/18/2017  . Frequent UTI   . GERD (gastroesophageal reflux disease)   . Headache   . Iron deficiency anemia 03/28/2016   Relevant past medical, surgical, family and social history reviewed and updated as indicated. Interim medical history since our last visit reviewed. Allergies and medications reviewed and updated. DATA REVIEWED: CHART IN EPIC  Family History reviewed for pertinent findings.  Review of Systems  Constitutional: Negative.   HENT: Negative.   Eyes: Negative.   Respiratory: Negative.   Gastrointestinal: Negative.   Genitourinary: Positive for difficulty urinating, dysuria and urgency. Negative for flank pain.    Allergies as of 09/27/2019      Reactions   Ivp Dye [iodinated Diagnostic Agents] Anaphylaxis   Shellfish Allergy Anaphylaxis   Aspirin Swelling, Other (See Comments)   Reaction:  Facial swelling       Medication List       Accurate as of September 27, 2019  3:47 PM. If  you have any questions, ask your nurse or doctor.        cephALEXin 500 MG capsule Commonly known as: KEFLEX Take 1 capsule (500 mg total) by mouth 2 (two) times daily. Started by: Terald Sleeper, PA-C   escitalopram 20 MG tablet Commonly known as: LEXAPRO Take 2 tablets (40 mg total) by mouth daily.   omeprazole 20 MG capsule Commonly known as: PRILOSEC Take 1 capsule (20 mg total) by mouth daily. Started by: Terald Sleeper, PA-C   traZODone 50 MG tablet Commonly known as: DESYREL Take 0.5-1 tablets (25-50 mg total) by mouth at bedtime as needed for sleep.          Objective:    BP 123/80   Pulse (!) 102   Temp 98.4 F (36.9 C) (Temporal)   Ht 5\' 3"  (1.6 m)   Wt 136 lb 6.4 oz (61.9 kg)   LMP 09/13/2019   SpO2 96%   BMI 24.16 kg/m   Allergies  Allergen Reactions  . Ivp Dye [Iodinated Diagnostic Agents] Anaphylaxis  . Shellfish Allergy Anaphylaxis  . Aspirin Swelling and Other (See Comments)    Reaction:  Facial swelling     Wt Readings from Last 3 Encounters:  09/27/19 136 lb 6.4 oz (61.9 kg)  09/01/19 136 lb 8  oz (61.9 kg)  08/13/19 139 lb (63 kg)    Physical Exam Constitutional:      Appearance: She is well-developed.  HENT:     Head: Normocephalic and atraumatic.  Eyes:     Conjunctiva/sclera: Conjunctivae normal.     Pupils: Pupils are equal, round, and reactive to light.  Cardiovascular:     Rate and Rhythm: Normal rate and regular rhythm.     Heart sounds: Normal heart sounds.  Pulmonary:     Effort: Pulmonary effort is normal.     Breath sounds: Normal breath sounds.  Abdominal:     General: Bowel sounds are normal. There is no distension.     Palpations: Abdomen is soft. There is no mass.     Tenderness: There is abdominal tenderness in the suprapubic area. There is no guarding or rebound.  Skin:    General: Skin is warm and dry.     Findings: No rash.  Neurological:     Mental Status: She is alert and oriented to person, place, and time.      Deep Tendon Reflexes: Reflexes are normal and symmetric.  Psychiatric:        Behavior: Behavior normal.        Thought Content: Thought content normal.        Judgment: Judgment normal.     Results for orders placed or performed in visit on 09/07/19  Prolactin  Result Value Ref Range   Prolactin 14.8 4.8 - 23.3 ng/mL      Assessment & Plan:   1. Dysuria - Urine Culture - Urinalysis, Complete - cephALEXin (KEFLEX) 500 MG capsule; Take 1 capsule (500 mg total) by mouth 2 (two) times daily.  Dispense: 20 capsule; Refill: 0  2. Indigestion - omeprazole (PRILOSEC) 20 MG capsule; Take 1 capsule (20 mg total) by mouth daily.  Dispense: 30 capsule; Refill: 0  3. Acute cystitis without hematuria - omeprazole (PRILOSEC) 20 MG capsule; Take 1 capsule (20 mg total) by mouth daily.  Dispense: 30 capsule; Refill: 0    Continue all other maintenance medications as listed above.  Follow up plan: No follow-ups on file.  Educational handout given for [place] PA-C [facility] [address]  [place], [place] [address] [phone]   09/27/2019, 3:47 PM ain over the bladder in the suprapubic region, no back pain. Denies leakage or hematuria.  Denies fever or chills. No pain in flank area.

## 2019-09-29 ENCOUNTER — Encounter: Payer: Self-pay | Admitting: Internal Medicine

## 2019-09-29 LAB — URINE CULTURE

## 2019-09-30 ENCOUNTER — Ambulatory Visit: Payer: Medicaid Other | Admitting: Internal Medicine

## 2019-10-01 ENCOUNTER — Other Ambulatory Visit: Payer: Self-pay

## 2019-10-04 ENCOUNTER — Ambulatory Visit: Payer: Medicaid Other

## 2019-10-08 ENCOUNTER — Encounter: Payer: Self-pay | Admitting: Family Medicine

## 2019-10-08 ENCOUNTER — Ambulatory Visit (INDEPENDENT_AMBULATORY_CARE_PROVIDER_SITE_OTHER): Payer: Medicaid Other | Admitting: Family Medicine

## 2019-10-08 DIAGNOSIS — K047 Periapical abscess without sinus: Secondary | ICD-10-CM | POA: Diagnosis not present

## 2019-10-08 MED ORDER — TRAMADOL HCL 50 MG PO TABS: 50.0000 mg | ORAL_TABLET | Freq: Four times a day (QID) | ORAL | 0 refills | Status: AC

## 2019-10-08 MED ORDER — AMOXICILLIN 875 MG PO TABS: 875.0000 mg | ORAL_TABLET | Freq: Two times a day (BID) | ORAL | 0 refills | Status: AC

## 2019-10-08 NOTE — Progress Notes (Signed)
Subjective:    Patient ID: Laurie Howell, female    DOB: 1976-07-28, 43 y.o.   MRN: [id number]   HPI: Laurie Howell is a 43 y.o. female presenting for tooth pain onset last night. Increasing since. Chipped it while eating yesterday. On the right in the middle.  PDMP for this patient shows she has had no controlled drug in the last 2 years.   Depression screen [facility] 2/9 09/27/2019 08/13/2019 06/10/2019 12/08/2018 12/07/2018  Decreased Interest 0 0 0 - 0  Down, Depressed, Hopeless 0 0 0 0 0  PHQ - 2 Score 0 0 0 0 0  Altered sleeping - - 0 0 -  Tired, decreased energy - - 0 - -  Change in appetite - - 0 - -  Feeling bad or failure about yourself  - - 0 - -  Trouble concentrating - - 0 - -  Moving slowly or fidgety/restless - - 0 - -  Suicidal thoughts - - 0 - -  PHQ-9 Score - - 0 0 -  Difficult doing work/chores - - - - -  Some recent data might be hidden     Relevant past medical, surgical, family and social history reviewed and updated as indicated.  Interim medical history since our last visit reviewed. Allergies and medications reviewed and updated.  ROS:  Review of Systems  Constitutional: Negative for chills, diaphoresis and fever.  HENT: Negative for congestion and rhinorrhea.   Neurological: Negative for headaches.     Social History   Tobacco Use  Smoking Status Former Smoker  . Years: 1.00  . Types: Cigarettes  . Quit date: 12/30/1994  . Years since quitting: 24.7  Smokeless Tobacco Never Used       Objective:     Wt Readings from Last 3 Encounters:  09/27/19 136 lb 6.4 oz (61.9 kg)  09/01/19 136 lb 8 oz (61.9 kg)  08/13/19 139 lb (63 kg)     Exam deferred. Pt. Harboring due to COVID 19. Phone visit performed.   Assessment & Plan:   1. Dental abscess     Meds ordered this encounter  Medications  . traMADol (ULTRAM) 50 MG tablet    Sig: Take 1 tablet (50 mg total) by mouth 4 (four) times daily for 5 days. 1-2 tablets up to 4 times a day as  needed for pain    Dispense:  20 tablet    Refill:  0  . amoxicillin (AMOXIL) 875 MG tablet    Sig: Take 1 tablet (875 mg total) by mouth 2 (two) times daily for 10 days.    Dispense:  20 tablet    Refill:  0    No orders of the defined types were placed in this encounter.     Diagnoses and all orders for this visit:  Dental abscess  Other orders -     traMADol (ULTRAM) 50 MG tablet; Take 1 tablet (50 mg total) by mouth 4 (four) times daily for 5 days. 1-2 tablets up to 4 times a day as needed for pain -     amoxicillin (AMOXIL) 875 MG tablet; Take 1 tablet (875 mg total) by mouth 2 (two) times daily for 10 days.    Virtual Visit via telephone Note  I discussed the limitations, risks, security and privacy concerns of performing an evaluation and management service by telephone and the availability of in person appointments. The patient was identified with two identifiers. Pt.expressed understanding and agreed to  proceed. Pt. Is at home. Dr. Livia Snellen is in his office.  Follow Up Instructions:   I discussed the assessment and treatment plan with the patient. The patient was provided an opportunity to ask questions and all were answered. The patient agreed with the plan and demonstrated an understanding of the instructions.   The patient was advised to call back or seek an in-person evaluation if the symptoms worsen or if the condition fails to improve as anticipated.   Total minutes including chart review and phone contact time: 12   Follow up plan: Return if symptoms worsen or fail to improve.  Claretta Fraise, MD [place]

## 2019-10-14 ENCOUNTER — Ambulatory Visit (INDEPENDENT_AMBULATORY_CARE_PROVIDER_SITE_OTHER): Payer: Medicaid Other

## 2019-10-14 ENCOUNTER — Other Ambulatory Visit: Payer: Self-pay

## 2019-10-14 DIAGNOSIS — Z23 Encounter for immunization: Secondary | ICD-10-CM | POA: Diagnosis not present

## 2019-10-26 ENCOUNTER — Other Ambulatory Visit: Payer: Self-pay | Admitting: Physician Assistant

## 2019-10-26 DIAGNOSIS — K30 Functional dyspepsia: Secondary | ICD-10-CM

## 2019-10-26 DIAGNOSIS — N3000 Acute cystitis without hematuria: Secondary | ICD-10-CM

## 2019-11-08 ENCOUNTER — Encounter: Payer: Self-pay | Admitting: *Deleted

## 2019-11-10 ENCOUNTER — Ambulatory Visit: Payer: Medicaid Other | Admitting: Internal Medicine

## 2019-11-11 ENCOUNTER — Telehealth: Payer: Self-pay

## 2019-11-11 NOTE — Telephone Encounter (Signed)
4 months long of cycle. Pt wants to go ahead with birth control as discussed in last appt.  [facility] [place].

## 2019-11-12 ENCOUNTER — Telehealth: Payer: Self-pay

## 2019-11-12 NOTE — Telephone Encounter (Signed)
Laurie Howell,   I do not know what kind of birth control the pt wants. She saw me for heavy bleeding but at the time she wanted to get pregnant so she refused BC. So I do not have it document what kind. We probably need to do a tele visit because I would also need to find out if she has any contraindications to estrogen.   Thanks,  Deneise Lever

## 2019-11-15 ENCOUNTER — Other Ambulatory Visit: Payer: Self-pay

## 2019-11-15 ENCOUNTER — Encounter: Payer: Self-pay | Admitting: Certified Nurse Midwife

## 2019-11-15 ENCOUNTER — Encounter: Payer: Self-pay | Admitting: Family Medicine

## 2019-11-15 ENCOUNTER — Ambulatory Visit: Payer: Medicaid Other | Admitting: Family Medicine

## 2019-11-15 ENCOUNTER — Ambulatory Visit (INDEPENDENT_AMBULATORY_CARE_PROVIDER_SITE_OTHER): Payer: Medicaid Other | Admitting: Certified Nurse Midwife

## 2019-11-15 VITALS — BP 114/72 | HR 81 | Ht 61.0 in | Wt 132.1 lb

## 2019-11-15 DIAGNOSIS — Z3009 Encounter for other general counseling and advice on contraception: Secondary | ICD-10-CM | POA: Diagnosis not present

## 2019-11-15 MED ORDER — LEVONORGEST-ETH ESTRAD 91-DAY 0.15-0.03 &0.01 MG PO TABS: 1.0000 | ORAL_TABLET | Freq: Every day | ORAL | 4 refills | Status: DC

## 2019-11-15 NOTE — Patient Instructions (Addendum)
Contraception Choices Contraception, also called birth control, means things to use or ways to try not to get pregnant. Hormonal birth control This kind of birth control uses hormones. Here are some types of hormonal birth control:  A tube that is put under skin of the arm (implant). The tube can stay in for as long as 3 years.  Shots to get every 3 months (injections).  Pills to take every day (birth control pills).  A patch to change 1 time each week for 3 weeks (birth control patch). After that, the patch is taken off for 1 week.  A ring to put in the vagina. The ring is left in for 3 weeks. Then it is taken out of the vagina for 1 week. Then a new ring is put in.  Pills to take after unprotected sex (emergency birth control pills). Barrier birth control Here are some types of barrier birth control:  A thin covering that is put on the penis before sex (female condom). The covering is thrown away after sex.  A soft, loose covering that is put in the vagina before sex (female condom). The covering is thrown away after sex.  A rubber bowl that sits over the cervix (diaphragm). The bowl must be made for you. The bowl is put into the vagina before sex. The bowl is left in for 6-8 hours after sex. It is taken out within 24 hours.  A small, soft cup that fits over the cervix (cervical cap). The cup must be made for you. The cup can be left in for 6-8 hours after sex. It is taken out within 48 hours.  A sponge that is put into the vagina before sex. It must be left in for at least 6 hours after sex. It must be taken out within 30 hours. Then it is thrown away.  A chemical that kills or stops sperm from getting into the uterus (spermicide). It may be a pill, cream, jelly, or foam to put in the vagina. The chemical should be used at least 10-15 minutes before sex. IUD (intrauterine) birth control An IUD is a small, T-shaped piece of plastic. It is put inside the uterus. There are two kinds:   Hormone IUD. This kind can stay in for 3-5 years.  Copper IUD. This kind can stay in for 10 years. Permanent birth control Here are some types of permanent birth control:  Surgery to block the fallopian tubes.  Having an insert put into each fallopian tube.  Surgery to tie off the tubes that carry sperm (vasectomy). Natural planning birth control Here are some types of natural planning birth control:  Not having sex on the days the woman could get pregnant.  Using a calendar: ? To keep track of the length of each period. ? To find out what days pregnancy can happen. ? To plan to not have sex on days when pregnancy can happen.  Watching for symptoms of ovulation and not having sex during ovulation. One way the woman can check for ovulation is to check her temperature.  Waiting to have sex until after ovulation. Summary  Contraception, also called birth control, means things to use or ways to try not to get pregnant.  Hormonal methods of birth control include implants, injections, pills, patches, vaginal rings, and emergency birth control pills.  Barrier methods of birth control can include female condoms, female condoms, diaphragms, cervical caps, sponges, and spermicides.  There are two types of IUD (intrauterine device) birth control.  An IUD can be put in a woman's uterus to prevent pregnancy for 3-5 years.  Permanent sterilization can be done through a procedure for males, females, or both.  Natural planning methods involve not having sex on the days when the woman could get pregnant. This information is not intended to replace advice given to you by your health care provider. Make sure you discuss any questions you have with your health care provider. Document Released: 10/13/2009 Document Revised: 04/07/2019 Document Reviewed: 12/26/2016 Elsevier Patient Education  2020 [employer].  Endometrial Ablation Endometrial ablation is a procedure that destroys the thin inner  layer of the lining of the uterus (endometrium). This procedure may be done:  To stop heavy periods.  To stop bleeding that is causing anemia.  To control irregular bleeding.  To treat bleeding caused by small tumors (fibroids) in the endometrium. This procedure is often an alternative to major surgery, such as removal of the uterus and cervix (hysterectomy). As a result of this procedure:  You may not be able to have children. However, if you are premenopausal (you have not gone through menopause): ? You may still have a small chance of getting pregnant. ? You will need to use a reliable method of birth control after the procedure to prevent pregnancy.  You may stop having a menstrual period, or you may have only a small amount of bleeding during your period. Menstruation may return several years after the procedure. Tell a health care provider about:  Any allergies you have.  All medicines you are taking, including vitamins, herbs, eye drops, creams, and over-the-counter medicines.  Any problems you or family members have had with the use of anesthetic medicines.  Any blood disorders you have.  Any surgeries you have had.  Any medical conditions you have. What are the risks? Generally, this is a safe procedure. However, problems may occur, including:  A hole (perforation) in the uterus or bowel.  Infection of the uterus, bladder, or vagina.  Bleeding.  Damage to other structures or organs.  An air bubble in the lung (air embolus).  Problems with pregnancy after the procedure.  Failure of the procedure.  Decreased ability to diagnose cancer in the endometrium. What happens before the procedure?  You will have tests of your endometrium to make sure there are no pre-cancerous cells or cancer cells present.  You may have an ultrasound of the uterus.  You may be given medicines to thin the endometrium.  Ask your health care provider about: ? Changing or stopping  your regular medicines. This is especially important if you take diabetes medicines or blood thinners. ? Taking medicines such as aspirin and ibuprofen. These medicines can thin your blood. Do not take these medicines before your procedure if your doctor tells you not to.  Plan to have someone take you home from the hospital or clinic. What happens during the procedure?   You will lie on an exam table with your feet and legs supported as in a pelvic exam.  To lower your risk of infection: ? Your health care team will wash or sanitize their hands and put on germ-free (sterile) gloves. ? Your genital area will be washed with soap.  An IV tube will be inserted into one of your veins.  You will be given a medicine to help you relax (sedative).  A surgical instrument with a light and camera (resectoscope) will be inserted into your vagina and moved into your uterus. This allows your surgeon  to see inside your uterus.  Endometrial tissue will be removed using one of the following methods: ? Radiofrequency. This method uses a radiofrequency-alternating electric current to remove the endometrium. ? Cryotherapy. This method uses extreme cold to freeze the endometrium. ? Heated-free liquid. This method uses a heated saltwater (saline) solution to remove the endometrium. ? Microwave. This method uses high-energy microwaves to heat up the endometrium and remove it. ? Thermal balloon. This method involves inserting a catheter with a balloon tip into the uterus. The balloon tip is filled with heated fluid to remove the endometrium. The procedure may vary among health care providers and hospitals. What happens after the procedure?  Your blood pressure, heart rate, breathing rate, and blood oxygen level will be monitored until the medicines you were given have worn off.  As tissue healing occurs, you may notice vaginal bleeding for 4-6 weeks after the procedure. You may also experience: ? Cramps. ?  Thin, watery vaginal discharge that is light pink or brown in color. ? A need to urinate more frequently than usual. ? Nausea.  Do not drive for 24 hours if you were given a sedative.  Do not have sex or insert anything into your vagina until your health care provider approves. Summary  Endometrial ablation is done to treat the many causes of heavy menstrual bleeding.  The procedure may be done only after medications have been tried to control the bleeding.  Plan to have someone take you home from the hospital or clinic. This information is not intended to replace advice given to you by your health care provider. Make sure you discuss any questions you have with your health care provider. Document Released: 10/25/2004 Document Revised: 06/02/2018 Document Reviewed: 01/02/2017 Elsevier Patient Education  2020 [employer].

## 2019-11-15 NOTE — Progress Notes (Signed)
  Subjective:    Laurie Howell is a 43 y.o. female who presents for contraception counseling. The patient has no complaints today. The patient is not currently sexually active. Pertinent past medical history: none. migraines no aura.   Menstrual History: OB History    Gravida  6   Para  2   Term  2   Preterm      AB  4   Living  2     SAB  4   TAB      Ectopic      Multiple      Live Births  2          No LMP recorded.    The following portions of the patient's history were reviewed and updated as appropriate: allergies, current medications, past family history, past medical history, past social history, past surgical history and problem list.  Review of Systems Pertinent items are noted in HPI.   Objective:    No exam performed today, not indicated for birth control .   Assessment:    43 y.o., starting OCP (estrogen/progesterone), no contraindications.   Plan:    All questions answered. Follow up as needed.  , pt is sperated from her spouse because they are not able to conceive.  Pt requesting information about ablation. Pamphlet given. Pt instructed to see Marcelline Mates if she would like to discuss further.   Philip Aspen, CNM

## 2019-11-23 ENCOUNTER — Telehealth: Payer: Self-pay

## 2019-11-23 NOTE — Telephone Encounter (Signed)
Pt states headached dizziness, body felt on fire. Pt took Benadryl. Pt says side effect is from new birth control.. please call in something different.  [facility].

## 2019-11-24 ENCOUNTER — Telehealth: Payer: Self-pay | Admitting: Certified Nurse Midwife

## 2019-11-24 ENCOUNTER — Other Ambulatory Visit: Payer: Self-pay | Admitting: Certified Nurse Midwife

## 2019-11-24 ENCOUNTER — Telehealth: Payer: Self-pay

## 2019-11-24 MED ORDER — NORGESTIMATE-ETH ESTRADIOL 0.25-35 MG-MCG PO TABS: 1.0000 | ORAL_TABLET | Freq: Every day | ORAL | 11 refills | Status: DC

## 2019-11-24 NOTE — Progress Notes (Signed)
Pt request to change pill. Orders placed.   Philip Aspen, CNM

## 2019-11-24 NOTE — Telephone Encounter (Signed)
Pt called in stated the new birth control she was put on is making her sick, PT symptoms is very bad headaches, feeling hot, dizzy. Pt is requesting a call to change birth control. Please advise

## 2019-11-24 NOTE — Telephone Encounter (Signed)
Mychart message sent to patient with ATs orders.

## 2019-11-24 NOTE — Telephone Encounter (Signed)
I changed her order to sprintec. Tell her that she may want to consider IUD due to her history of heavy bleeding.    We can also try Lysteda.   Thanks,  Deneise Lever

## 2019-11-29 ENCOUNTER — Encounter: Payer: Medicaid Other | Admitting: Certified Nurse Midwife

## 2019-11-30 NOTE — Telephone Encounter (Signed)
Pt called in advised pt that the order was sent in to call the pharmacy.

## 2019-12-06 NOTE — Telephone Encounter (Signed)
error 

## 2019-12-13 ENCOUNTER — Other Ambulatory Visit: Payer: Self-pay | Admitting: *Deleted

## 2019-12-13 DIAGNOSIS — J069 Acute upper respiratory infection, unspecified: Secondary | ICD-10-CM

## 2019-12-13 MED ORDER — CETIRIZINE HCL 10 MG PO TABS: 10.0000 mg | ORAL_TABLET | Freq: Every day | ORAL | 5 refills | Status: DC

## 2019-12-16 ENCOUNTER — Telehealth: Payer: Self-pay | Admitting: Family Medicine

## 2019-12-16 ENCOUNTER — Telehealth: Payer: Self-pay

## 2019-12-16 NOTE — Telephone Encounter (Signed)
Patient wants to switch birth control pills.  She wanted to be seen tomorrow afternoon by Monia Pouch but I explained to her that she did not have any appointments available until next Wednesday.  Patient declined an appointment at that time.  Said she would contact her ob/gyn office.

## 2019-12-16 NOTE — Telephone Encounter (Signed)
Patient is experiencing Headaches, super hot & nauseated. Would like to try a different birth control pill. Please advise.

## 2019-12-20 NOTE — Telephone Encounter (Signed)
Laurie Howell,   Please let her know that I have her on a very low dose pill and that she may want to consider a progestin only method. Like IUD, nexplanon or depo injections.   Thanks  Deneise Lever

## 2019-12-21 ENCOUNTER — Telehealth: Payer: Self-pay

## 2019-12-21 NOTE — Telephone Encounter (Signed)
mychart message sent to patient

## 2020-01-10 ENCOUNTER — Telehealth: Payer: Self-pay | Admitting: Family Medicine

## 2020-01-10 NOTE — Telephone Encounter (Signed)
Aware.  She will try follow up with her ob-gyn provider.

## 2020-01-13 ENCOUNTER — Encounter: Payer: Self-pay | Admitting: Adult Health

## 2020-01-13 ENCOUNTER — Ambulatory Visit: Payer: Medicaid Other | Admitting: Adult Health

## 2020-01-13 ENCOUNTER — Other Ambulatory Visit (HOSPITAL_COMMUNITY)
Admission: RE | Admit: 2020-01-13 | Discharge: 2020-01-13 | Disposition: A | Discharge status: Home / Self Care | Payer: Medicaid Other | Source: Ambulatory Visit | Attending: Adult Health | Admitting: Adult Health

## 2020-01-13 ENCOUNTER — Other Ambulatory Visit: Payer: Self-pay

## 2020-01-13 VITALS — BP 119/78 | HR 86 | Ht 61.0 in | Wt 137.0 lb

## 2020-01-13 DIAGNOSIS — R102 Pelvic and perineal pain: Secondary | ICD-10-CM

## 2020-01-13 DIAGNOSIS — N921 Excessive and frequent menstruation with irregular cycle: Secondary | ICD-10-CM | POA: Insufficient documentation

## 2020-01-13 DIAGNOSIS — Z3202 Encounter for pregnancy test, result negative: Secondary | ICD-10-CM | POA: Diagnosis not present

## 2020-01-13 DIAGNOSIS — Z124 Encounter for screening for malignant neoplasm of cervix: Secondary | ICD-10-CM | POA: Insufficient documentation

## 2020-01-13 HISTORY — DX: Pelvic and perineal pain: R10.2

## 2020-01-13 HISTORY — DX: Excessive and frequent menstruation with irregular cycle: N92.1

## 2020-01-13 LAB — POCT HEMOGLOBIN: Hemoglobin: 11.3 g/dL (ref 11–14.6)

## 2020-01-13 LAB — POCT URINE PREGNANCY: Preg Test, Ur: NEGATIVE

## 2020-01-13 MED ORDER — MEGESTROL ACETATE 40 MG PO TABS: ORAL_TABLET | ORAL | 1 refills | Status: DC

## 2020-01-13 MED ORDER — PRENATAL PLUS 27-1 MG PO TABS: 1.0000 | ORAL_TABLET | Freq: Every day | ORAL | 6 refills | Status: DC

## 2020-01-13 NOTE — Progress Notes (Signed)
Patient ID: Laurie Howell, female   DOB: 1976/10/06, 44 y.o.   MRN: [id number] History of Present Illness: Laurie Howell is a 44 year old white female, separated, [id number] sp tubal then tubal reversal, in complaining of bleeding for last 3 weeks and abdomen swelling for 5 years and is tired. She had normal labs and [place] back in July and August 2020. She is says she is interested in ablation.  PCP is Laurie Lesches NP   Current Medications, Allergies, Past Medical History, Past Surgical History, Family History and Social History were reviewed in [employer] record.     Review of Systems: Bleeding for 3 weeks  Has had bleeding for 8 weeks in past Has had swelling in abdomen for 5 years Has pain in left side at times too. +tired   Physical Exam:BP 119/78 (BP Location: Left Leg, Patient Position: Sitting, Cuff Size: Normal)   Pulse 86   Ht 5\' 1"  (1.549 m)   Wt 137 lb (62.1 kg)   LMP 12/29/2019   BMI 25.89 kg/m  HGB 11.3. UPT is negative. General:  Well developed, well nourished, no acute distress Skin:  Warm and dry Neck:  Midline trachea, normal thyroid, good ROM, no lymphadenopathy Lungs; Clear to auscultation bilaterally Cardiovascular: Regular rate and rhythm Pelvic:  External genitalia is normal in appearance, no lesions.  The vagina is normal in appearance.Period like blood, no odor Urethra has no lesions or masses. The cervix is bulbous.Pap with GC/CHL and high risk HPV 16/18 genotyping performed.   Uterus is felt to be normal size, shape, and contour.  No adnexal masses or tenderness noted.Bladder is non tender, no masses felt. Psych:  No mood changes, alert and cooperative,seems happy Fall risk is moderate PHQ 2 score is 0.  Examination chaperoned by Laurie Pupa LPN.  Impression and Plan:  1. Menorrhagia with irregular cycle Will rx megace, UPT is negative.  Meds ordered this encounter  Medications  . prenatal vitamin w/FE, FA (PRENATAL 1 + 1) 27-1 MG TABS  tablet    Sig: Take 1 tablet by mouth daily at 12 noon.    Dispense:  30 tablet    Refill:  6    Order Specific Question:   Supervising Provider    Answer:   Laurie Howell, Laurie Howell [[id number]]  . megestrol (MEGACE) 40 MG tablet    Sig: Take 3 x 5 days then 2 x 5 days then 1 daily    Dispense:  45 tablet    Refill:  1    Order Specific Question:   Supervising Provider    Answer:   Laurie Howell [[id number]]   Review handout ablation.  2. Pelvic pain Get GYN [place] in 1 week to evaluate uterus and ovaries   3. Pap smear for cervical cancer screening Pap sent

## 2020-01-20 ENCOUNTER — Other Ambulatory Visit: Payer: Self-pay

## 2020-01-20 ENCOUNTER — Ambulatory Visit (INDEPENDENT_AMBULATORY_CARE_PROVIDER_SITE_OTHER): Payer: Medicaid Other

## 2020-01-20 DIAGNOSIS — N921 Excessive and frequent menstruation with irregular cycle: Secondary | ICD-10-CM

## 2020-01-20 DIAGNOSIS — R102 Pelvic and perineal pain: Secondary | ICD-10-CM

## 2020-01-20 NOTE — Progress Notes (Signed)
PELVIC US TA/TV: homogeneous anteverted uterus with a small intermural fibroid mid/left uterus .5 x .7 x .5 cm,EEC 2.5 mm,normal ovaries,ovaries appear mobile,no free fluid,no pain during ultrasound

## 2020-01-24 ENCOUNTER — Telehealth: Payer: Self-pay | Admitting: *Deleted

## 2020-01-24 NOTE — Telephone Encounter (Signed)
Pt having dizziness and felt faint at [place] today. Feels better this afternoon. Says that the PNV with iron is not helping, also taking megace. Thinks that she may need another iron infusion. Advised I would send message to [facility] and we would let her know what she would like for her to do.

## 2020-01-24 NOTE — Telephone Encounter (Signed)
Patient left message that she is wanting to discuss her "iron levels".

## 2020-01-31 ENCOUNTER — Telehealth: Payer: Self-pay | Admitting: Certified Nurse Midwife

## 2020-01-31 ENCOUNTER — Telehealth: Payer: Self-pay

## 2020-01-31 NOTE — Telephone Encounter (Signed)
Mychart message sent to patient. She saw Derrek Monaco NP on 01/13/20 and discussed an ablation with her.

## 2020-01-31 NOTE — Telephone Encounter (Signed)
pt called in and stated she talked to Laurie Howell about doing ablation. The pt is requesting more info and wants to maybe schedule the appt. Please advise

## 2020-03-02 ENCOUNTER — Encounter: Payer: Self-pay | Admitting: Adult Health

## 2020-03-02 ENCOUNTER — Other Ambulatory Visit: Payer: Self-pay

## 2020-03-02 ENCOUNTER — Ambulatory Visit (INDEPENDENT_AMBULATORY_CARE_PROVIDER_SITE_OTHER): Payer: Medicaid Other | Admitting: Adult Health

## 2020-03-02 ENCOUNTER — Other Ambulatory Visit (HOSPITAL_COMMUNITY)
Admission: RE | Admit: 2020-03-02 | Discharge: 2020-03-02 | Disposition: A | Discharge status: Home / Self Care | Payer: Medicaid Other | Source: Ambulatory Visit | Attending: Adult Health | Admitting: Adult Health

## 2020-03-02 VITALS — BP 116/76 | HR 95 | Ht 61.0 in | Wt 138.0 lb

## 2020-03-02 DIAGNOSIS — Z124 Encounter for screening for malignant neoplasm of cervix: Secondary | ICD-10-CM

## 2020-03-02 DIAGNOSIS — N921 Excessive and frequent menstruation with irregular cycle: Secondary | ICD-10-CM

## 2020-03-02 DIAGNOSIS — G44021 Chronic cluster headache, intractable: Secondary | ICD-10-CM | POA: Diagnosis not present

## 2020-03-02 DIAGNOSIS — R102 Pelvic and perineal pain: Secondary | ICD-10-CM

## 2020-03-02 HISTORY — DX: Chronic cluster headache, intractable: G44.021

## 2020-03-02 MED ORDER — BUTALBITAL-APAP-CAFFEINE 50-325-40 MG PO TABS: 1.0000 | ORAL_TABLET | Freq: Four times a day (QID) | ORAL | 0 refills | Status: DC | PRN

## 2020-03-02 NOTE — Progress Notes (Signed)
  Subjective:     Patient ID: Laurie Howell, female   DOB: June 11, 1976, 44 y.o.   MRN: [id number]  HPI Laurie Howell is a 44 year old white female,married, [id number], in to talk about getting the ablation to stop her periods.  PCP is Darla Lesches NP.  Review of Systems The megace has helped with bleeding, but still will bleed  +pelvic pain  Reviewed past medical,surgical, social and family history. Reviewed medications and allergies.      Objective:   Physical Exam BP 116/76 (BP Location: Left Arm, Patient Position: Sitting, Cuff Size: Normal)   Pulse 95   Ht 5\' 1"  (1.549 m)   Wt 138 lb (62.6 kg)   LMP 02/29/2020   BMI 26.07 kg/m  Skin warm and dry. Lungs: clear to ausculation bilaterally. Cardiovascular: regular rate and rhythm. Pelvic: external genitalia is normal in appearance no lesions, vagina:pink with good moisture,,urethra has no lesions or masses noted, cervix:smooth and bulbous,has some strawberry appearance, pap with GC/CHL and  high risk HPV 16/18 genotyping performed  uterus: normal size, shape and contour, mildly tender, no masses felt, adnexa: no masses or tenderness noted. Bladder is non tender and no masses felt.    Assessment:     1. Menorrhagia with irregular cycle Return 03/13/20 for pre op with Dr Elonda Husky for ablation  2. Pap smear for cervical cancer screening Pap sent   3. Pelvic pain Had normal [place] 01/20/20  4. Intractable chronic cluster headache Will rx Fioricet Meds ordered this encounter  Medications  . butalbital-acetaminophen-caffeine (FIORICET) 50-325-40 MG tablet    Sig: Take 1-2 tablets by mouth every 6 (six) hours as needed for headache.    Dispense:  20 tablet    Refill:  0    Order Specific Question:   Supervising Provider    Answer:   Florian Buff [[id number]]      Plan:     Follow up with Dr Elonda Husky

## 2020-03-07 LAB — CYTOLOGY - PAP
Chlamydia: NEGATIVE
Comment: NEGATIVE
Comment: NEGATIVE
Comment: NORMAL
Diagnosis: NEGATIVE
High risk HPV: NEGATIVE
Neisseria Gonorrhea: NEGATIVE

## 2020-03-13 ENCOUNTER — Encounter: Payer: Self-pay | Admitting: Obstetrics & Gynecology

## 2020-03-13 ENCOUNTER — Ambulatory Visit (INDEPENDENT_AMBULATORY_CARE_PROVIDER_SITE_OTHER): Payer: Medicaid Other | Admitting: Obstetrics & Gynecology

## 2020-03-13 ENCOUNTER — Other Ambulatory Visit: Payer: Self-pay

## 2020-03-13 VITALS — BP 116/71 | HR 86 | Ht 62.0 in | Wt 138.0 lb

## 2020-03-13 DIAGNOSIS — N946 Dysmenorrhea, unspecified: Secondary | ICD-10-CM | POA: Diagnosis not present

## 2020-03-13 DIAGNOSIS — R102 Pelvic and perineal pain: Secondary | ICD-10-CM | POA: Diagnosis not present

## 2020-03-13 DIAGNOSIS — N921 Excessive and frequent menstruation with irregular cycle: Secondary | ICD-10-CM

## 2020-03-13 NOTE — Progress Notes (Signed)
Preoperative History and Physical  Laurie Howell is a 44 y.o. [id number] with Patient's last menstrual period was 02/29/2020. admitted for a hysteroscopy uterine curettage Minerva ablation.  Long history of menometrorrhagia dysmenorrhea Also reports dyspareunia but cannot avoid lifting if she were to have a TVH Opts for ablation not TVH to solve her bump dyspareunia  PMH:    Past Medical History:  Diagnosis Date  . Anemia   . Chronic gastritis   . Esophageal stricture   . Fibroid 06/18/2017  . Frequent UTI   . GERD (gastroesophageal reflux disease)   . Headache   . Iron deficiency anemia 03/28/2016   due to menorrhagia  . Ovarian cyst     PSH:     Past Surgical History:  Procedure Laterality Date  . ESOPHAGOGASTRODUODENOSCOPY N/A 06/27/2016   Procedure: ESOPHAGOGASTRODUODENOSCOPY (EGD);  Surgeon: Danie Binder, MD;  Location: [facility];  Service: Endoscopy;  Laterality: N/A;  1245  . SAVORY DILATION N/A 06/27/2016   Procedure: SAVORY DILATION;  Surgeon: Danie Binder, MD;  Location: [facility];  Service: Endoscopy;  Laterality: N/A;  . TUBAL LIGATION  1997  . Tubal reversal  02/2015   [facility] - Dr Karie Kirks    POb/GynH:      OB History    Gravida  6   Para  2   Term  2   Preterm      AB  4   Living  2     SAB  4   TAB      Ectopic      Multiple      Live Births  2           SH:   Social History   Tobacco Use  . Smoking status: Former Smoker    Years: 1.00    Types: Cigarettes    Quit date: 12/30/1994    Years since quitting: 25.2  . Smokeless tobacco: Never Used  Substance Use Topics  . Alcohol use: No  . Drug use: No    FH:    Family History  Problem Relation Age of Onset  . Depression Mother   . Hypertension Mother   . Other Mother        open heart surgery  . Heart disease Father   . Hypertension Father   . Anxiety disorder Father   . Heart attack Father   . Stroke Maternal Grandmother   . Colon cancer Neg Hx       Allergies:  Allergies  Allergen Reactions  . Ivp Dye [Iodinated Diagnostic Agents] Anaphylaxis  . Shellfish Allergy Anaphylaxis  . Aspirin Swelling and Other (See Comments)    Reaction:  Facial swelling     Medications:       Current Outpatient Medications:  .  butalbital-acetaminophen-caffeine (FIORICET) 50-325-40 MG tablet, Take 1-2 tablets by mouth every 6 (six) hours as needed for headache., Disp: 20 tablet, Rfl: 0 .  megestrol (MEGACE) 40 MG tablet, Take 3 x 5 days then 2 x 5 days then 1 daily, Disp: 45 tablet, Rfl: 1 .  prenatal vitamin w/FE, FA (PRENATAL 1 + 1) 27-1 MG TABS tablet, Take 1 tablet by mouth daily at 12 noon., Disp: 30 tablet, Rfl: 6  Review of Systems:   Review of Systems  Constitutional: Negative for fever, chills, weight loss, malaise/fatigue and diaphoresis.  HENT: Negative for hearing loss, ear pain, nosebleeds, congestion, sore throat, neck pain, tinnitus and ear discharge.   Eyes: Negative  for blurred vision, double vision, photophobia, pain, discharge and redness.  Respiratory: Negative for cough, hemoptysis, sputum production, shortness of breath, wheezing and stridor.   Cardiovascular: Negative for chest pain, palpitations, orthopnea, claudication, leg swelling and PND.  Gastrointestinal: Positive for abdominal pain. Negative for heartburn, nausea, vomiting, diarrhea, constipation, blood in stool and melena.  Genitourinary: Negative for dysuria, urgency, frequency, hematuria and flank pain.  Musculoskeletal: Negative for myalgias, back pain, joint pain and falls.  Skin: Negative for itching and rash.  Neurological: Negative for dizziness, tingling, tremors, sensory change, speech change, focal weakness, seizures, loss of consciousness, weakness and headaches.  Endo/Heme/Allergies: Negative for environmental allergies and polydipsia. Does not bruise/bleed easily.  Psychiatric/Behavioral: Negative for depression, suicidal ideas, hallucinations, memory  loss and substance abuse. The patient is not nervous/anxious and does not have insomnia.      PHYSICAL EXAM:  Blood pressure 116/71, pulse 86, height 5\' 2"  (1.575 m), weight 138 lb (62.6 kg), last menstrual period 02/29/2020.    Vitals reviewed. Constitutional: She is oriented to person, place, and time. She appears well-developed and well-nourished.  HENT:  Head: Normocephalic and atraumatic.  Right Ear: External ear normal.  Left Ear: External ear normal.  Nose: Nose normal.  Mouth/Throat: Oropharynx is clear and moist.  Eyes: Conjunctivae and EOM are normal. Pupils are equal, round, and reactive to light. Right eye exhibits no discharge. Left eye exhibits no discharge. No scleral icterus.  Neck: Normal range of motion. Neck supple. No tracheal deviation present. No thyromegaly present.  Cardiovascular: Normal rate, regular rhythm, normal heart sounds and intact distal pulses.  Exam reveals no gallop and no friction rub.   No murmur heard. Respiratory: Effort normal and breath sounds normal. No respiratory distress. She has no wheezes. She has no rales. She exhibits no tenderness.  GI: Soft. Bowel sounds are normal. She exhibits no distension and no mass. There is tenderness. There is no rebound and no guarding.  Genitourinary:       Vulva is normal without lesions Vagina is pink moist without discharge Cervix normal in appearance and pap is normal Uterus is normal size, contour, position, consistency, mobility, non-tender Adnexa is negative with normal sized ovaries by sonogram  Musculoskeletal: Normal range of motion. She exhibits no edema and no tenderness.  Neurological: She is alert and oriented to person, place, and time. She has normal reflexes. She displays normal reflexes. No cranial nerve deficit. She exhibits normal muscle tone. Coordination normal.  Skin: Skin is warm and dry. No rash noted. No erythema. No pallor.  Psychiatric: She has a normal mood and affect. Her  behavior is normal. Judgment and thought content normal.    Labs: Results for orders placed or performed in visit on 03/02/20 (from the past 336 hour(s))  Cytology - PAP( [place])   Collection Time: 03/02/20  4:04 PM  Result Value Ref Range   High risk HPV Negative    Neisseria Gonorrhea Negative    Chlamydia Negative    Adequacy      Satisfactory for evaluation; transformation zone component PRESENT.   Diagnosis      - Negative for intraepithelial lesion or malignancy (NILM)   Comment Normal Reference Ranger Chlamydia - Negative    Comment      Normal Reference Range Neisseria Gonorrhea - Negative   Comment Normal Reference Range HPV - Negative     EKG: No orders found for this or any previous visit.  Imaging Studies: No results found.    Assessment:  Patient Active Problem List   Diagnosis Date Noted  . Intractable chronic cluster headache 03/02/2020  . Pelvic pain 01/13/2020  . Menorrhagia with irregular cycle 01/13/2020  . Pap smear for cervical cancer screening 01/13/2020  . GAD (generalized anxiety disorder) 08/13/2019  . Chronic idiopathic constipation 08/13/2019  . Insomnia due to medical condition 08/13/2019  . Abnormal thyroid blood test 07/22/2019  . Nerve pain 08/12/2017  . Fibroid 06/18/2017  . Abdominal bloating 06/03/2017  . Depression, recurrent ([place]) 06/03/2017  . DUB (dysfunctional uterine bleeding) 06/03/2017  . LLQ pain 06/03/2017  . Menorrhagia with regular cycle 06/03/2017  . Anovulation 01/23/2017  . Hyperthyroidism 01/23/2017  . Gastroesophageal reflux disease with esophagitis   . Dysphagia 06/20/2016  . Iron deficiency anemia 03/28/2016  . 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 Intractable migraine without aura 01/14/2014  . Daily headache 01/14/2014    Plan: Hysteroscopy uterine curettage endometrial ablation, minerva, 03/22/20  Mertie Clause Maame Dack 03/13/2020 4:39 PM     Face to face time:  10 minutes  Greater than 50% of  the visit time was spent in counseling and coordination of care with the patient.  The summary and outline of the counseling and care coordination is summarized in the note above.   All questions were answered.

## 2020-03-15 NOTE — Patient Instructions (Signed)
Your procedure is scheduled on: 03/22/2020  Report to [facility] at    12:00 PM.  Call this number if you have problems the morning of surgery: [phone]   Remember:   Do not Eat or Drink after midnight         No Smoking the morning of surgery  :  Take these medicines the morning of surgery with A SIP OF WATER: none   Do not wear jewelry, make-up or nail polish.  Do not wear lotions, powders, or perfumes. You may wear deodorant.  Do not shave 48 hours prior to surgery. Men may shave face and neck.  Do not bring valuables to the hospital.  Contacts, dentures or bridgework may not be worn into surgery.  Leave suitcase in the car. After surgery it may be brought to your room.  For patients admitted to the hospital, checkout time is 11:00 AM the day of discharge.   Patients discharged the day of surgery will not be allowed to drive home.    Special Instructions: Shower using CHG night before surgery and shower the day of surgery use CHG.  Use special wash - you have one bottle of CHG for all showers.  You should use approximately 1/2 of the bottle for each shower.  Dilation and Curettage , Care After These instructions give you information about caring for yourself after your procedure. Your doctor may also give you more specific instructions. Call your doctor if you have any problems or questions after your procedure. Follow these instructions at home: Activity  Do not drive or use heavy machinery while taking prescription pain medicine.  For 24 hours after your procedure, avoid driving.  Take short walks often, followed by rest periods. Ask your doctor what activities are safe for you. After one or two days, you may be able to return to your normal activities.  Do not lift anything that is heavier than 10 lb (4.5 kg) until your doctor approves.  For at least 2 weeks, or as long as told by your doctor: ? Do not douche. ? Do not use tampons. ? Do not have sex. General  instructions   Take over-the-counter and prescription medicines only as told by your doctor. This is very important if you take blood thinning medicine.  Do not take baths, swim, or use a hot tub until your doctor approves. Take showers instead of baths.  Wear compression stockings as told by your doctor.  It is up to you to get the results of your procedure. Ask your doctor when your results will be ready.  Keep all follow-up visits as told by your doctor. This is important. Contact a doctor if:  You have very bad cramps that get worse or do not get better with medicine.  You have very bad pain in your belly (abdomen).  You cannot drink fluids without throwing up (vomiting).  You get pain in a different part of the area between your belly and thighs (pelvis).  You have bad-smelling discharge from your vagina.  You have a rash. Get help right away if:  You are bleeding a lot from your vagina. A lot of bleeding means soaking more than one sanitary pad in an hour, for 2 hours in a row.  You have clumps of blood (blood clots) coming from your vagina.  You have a fever or chills.  Your belly feels very tender or hard.  You have chest pain.  You have trouble breathing.  You cough  up blood.  You feel dizzy.  You feel light-headed.  You pass out (faint).  You have pain in your neck or shoulder area. Summary  Take short walks often, followed by rest periods. Ask your doctor what activities are safe for you. After one or two days, you may be able to return to your normal activities.  Do not lift anything that is heavier than 10 lb (4.5 kg) until your doctor approves.  Do not take baths, swim, or use a hot tub until your doctor approves. Take showers instead of baths.  Contact your doctor if you have any symptoms of infection, like bad-smelling discharge from your vagina. This information is not intended to replace advice given to you by your health care provider. Make  sure you discuss any questions you have with your health care provider. Document Revised: 11/28/2017 Document Reviewed: 09/02/2016 Elsevier Patient Education  2020 [place].  Endometrial Ablation Endometrial ablation is a procedure that destroys the thin inner layer of the lining of the uterus (endometrium). This procedure may be done:  To stop heavy periods.  To stop bleeding that is causing anemia.  To control irregular bleeding.  To treat bleeding caused by small tumors (fibroids) in the endometrium. This procedure is often an alternative to major surgery, such as removal of the uterus and cervix (hysterectomy). As a result of this procedure:  You may not be able to have children. However, if you are premenopausal (you have not gone through menopause): ? You may still have a small chance of getting pregnant. ? You will need to use a reliable method of birth control after the procedure to prevent pregnancy.  You may stop having a menstrual period, or you may have only a small amount of bleeding during your period. Menstruation may return several years after the procedure. Tell a health care provider about:  Any allergies you have.  All medicines you are taking, including vitamins, herbs, eye drops, creams, and over-the-counter medicines.  Any problems you or family members have had with the use of anesthetic medicines.  Any blood disorders you have.  Any surgeries you have had.  Any medical conditions you have. What are the risks? Generally, this is a safe procedure. However, problems may occur, including:  A hole (perforation) in the uterus or bowel.  Infection of the uterus, bladder, or vagina.  Bleeding.  Damage to other structures or organs.  An air bubble in the lung (air embolus).  Problems with pregnancy after the procedure.  Failure of the procedure.  Decreased ability to diagnose cancer in the endometrium. What happens before the procedure?  You  will have tests of your endometrium to make sure there are no pre-cancerous cells or cancer cells present.  You may have an ultrasound of the uterus.  You may be given medicines to thin the endometrium.  Ask your health care provider about: ? Changing or stopping your regular medicines. This is especially important if you take diabetes medicines or blood thinners. ? Taking medicines such as aspirin and ibuprofen. These medicines can thin your blood. Do not take these medicines before your procedure if your doctor tells you not to.  Plan to have someone take you home from the hospital or clinic. What happens during the procedure?   You will lie on an exam table with your feet and legs supported as in a pelvic exam.  To lower your risk of infection: ? Your health care team will wash or sanitize their hands  and put on germ-free (sterile) gloves. ? Your genital area will be washed with soap.  An IV tube will be inserted into one of your veins.  You will be given a medicine to help you relax (sedative).  A surgical instrument with a light and camera (resectoscope) will be inserted into your vagina and moved into your uterus. This allows your surgeon to see inside your uterus.  Endometrial tissue will be removed using one of the following methods: ? Radiofrequency. This method uses a radiofrequency-alternating electric current to remove the endometrium. ? Cryotherapy. This method uses extreme cold to freeze the endometrium. ? Heated-free liquid. This method uses a heated saltwater (saline) solution to remove the endometrium. ? Microwave. This method uses high-energy microwaves to heat up the endometrium and remove it. ? Thermal balloon. This method involves inserting a catheter with a balloon tip into the uterus. The balloon tip is filled with heated fluid to remove the endometrium. The procedure may vary among health care providers and hospitals. What happens after the procedure?  Your  blood pressure, heart rate, breathing rate, and blood oxygen level will be monitored until the medicines you were given have worn off.  As tissue healing occurs, you may notice vaginal bleeding for 4-6 weeks after the procedure. You may also experience: ? Cramps. ? Thin, watery vaginal discharge that is light pink or brown in color. ? A need to urinate more frequently than usual. ? Nausea.  Do not drive for 24 hours if you were given a sedative.  Do not have sex or insert anything into your vagina until your health care provider approves. Summary  Endometrial ablation is done to treat the many causes of heavy menstrual bleeding.  The procedure may be done only after medications have been tried to control the bleeding.  Plan to have someone take you home from the hospital or clinic. This information is not intended to replace advice given to you by your health care provider. Make sure you discuss any questions you have with your health care provider. Document Revised: 06/02/2018 Document Reviewed: 01/02/2017 Elsevier Patient Education  2020 [place] Anesthesia, Adult, Care After This sheet gives you information about how to care for yourself after your procedure. Your health care provider may also give you more specific instructions. If you have problems or questions, contact your health care provider. What can I expect after the procedure? After the procedure, the following side effects are common:  Pain or discomfort at the IV site.  Nausea.  Vomiting.  Sore throat.  Trouble concentrating.  Feeling cold or chills.  Weak or tired.  Sleepiness and fatigue.  Soreness and body aches. These side effects can affect parts of the body that were not involved in surgery. Follow these instructions at home:  For at least 24 hours after the procedure:  Have a responsible adult stay with you. It is important to have someone help care for you until you are awake and  alert.  Rest as needed.  Do not: ? Participate in activities in which you could fall or become injured. ? Drive. ? Use heavy machinery. ? Drink alcohol. ? Take sleeping pills or medicines that cause drowsiness. ? Make important decisions or sign legal documents. ? Take care of children on your own. Eating and drinking  Follow any instructions from your health care provider about eating or drinking restrictions.  When you feel hungry, start by eating small amounts of foods that are soft and easy  to digest (bland), such as toast. Gradually return to your regular diet.  Drink enough fluid to keep your urine pale yellow.  If you vomit, rehydrate by drinking water, juice, or clear broth. General instructions  If you have sleep apnea, surgery and certain medicines can increase your risk for breathing problems. Follow instructions from your health care provider about wearing your sleep device: ? Anytime you are sleeping, including during daytime naps. ? While taking prescription pain medicines, sleeping medicines, or medicines that make you drowsy.  Return to your normal activities as told by your health care provider. Ask your health care provider what activities are safe for you.  Take over-the-counter and prescription medicines only as told by your health care provider.  If you smoke, do not smoke without supervision.  Keep all follow-up visits as told by your health care provider. This is important. Contact a health care provider if:  You have nausea or vomiting that does not get better with medicine.  You cannot eat or drink without vomiting.  You have pain that does not get better with medicine.  You are unable to pass urine.  You develop a skin rash.  You have a fever.  You have redness around your IV site that gets worse. Get help right away if:  You have difficulty breathing.  You have chest pain.  You have blood in your urine or stool, or you vomit  blood. Summary  After the procedure, it is common to have a sore throat or nausea. It is also common to feel tired.  Have a responsible adult stay with you for the first 24 hours after general anesthesia. It is important to have someone help care for you until you are awake and alert.  When you feel hungry, start by eating small amounts of foods that are soft and easy to digest (bland), such as toast. Gradually return to your regular diet.  Drink enough fluid to keep your urine pale yellow.  Return to your normal activities as told by your health care provider. Ask your health care provider what activities are safe for you. This information is not intended to replace advice given to you by your health care provider. Make sure you discuss any questions you have with your health care provider. Document Revised: 12/19/2017 Document Reviewed: 08/01/2017 Elsevier Patient Education  [place].

## 2020-03-20 ENCOUNTER — Encounter (HOSPITAL_COMMUNITY)
Admission: RE | Admit: 2020-03-20 | Discharge: 2020-03-20 | Disposition: A | Discharge status: Home / Self Care | Payer: Medicaid Other | Source: Ambulatory Visit | Attending: Obstetrics & Gynecology | Admitting: Obstetrics & Gynecology

## 2020-03-20 ENCOUNTER — Other Ambulatory Visit (HOSPITAL_COMMUNITY)
Admission: RE | Admit: 2020-03-20 | Discharge: 2020-03-20 | Disposition: A | Discharge status: Home / Self Care | Payer: Medicaid Other | Source: Ambulatory Visit | Attending: Obstetrics & Gynecology | Admitting: Obstetrics & Gynecology

## 2020-03-20 ENCOUNTER — Encounter (HOSPITAL_COMMUNITY): Payer: Self-pay

## 2020-03-20 ENCOUNTER — Other Ambulatory Visit: Payer: Self-pay | Admitting: Obstetrics & Gynecology

## 2020-03-20 ENCOUNTER — Other Ambulatory Visit: Payer: Self-pay

## 2020-03-20 DIAGNOSIS — Z01812 Encounter for preprocedural laboratory examination: Secondary | ICD-10-CM | POA: Diagnosis not present

## 2020-03-20 DIAGNOSIS — Z20822 Contact with and (suspected) exposure to covid-19: Secondary | ICD-10-CM | POA: Insufficient documentation

## 2020-03-20 LAB — CBC
HCT: 38.7 % (ref 36.0–46.0)
Hemoglobin: 12.2 g/dL (ref 12.0–15.0)
MCH: 28.8 pg (ref 26.0–34.0)
MCHC: 31.5 g/dL (ref 30.0–36.0)
MCV: 91.5 fL (ref 80.0–100.0)
Platelets: 333 10*3/uL (ref 150–400)
RBC: 4.23 MIL/uL (ref 3.87–5.11)
RDW: 14.3 % (ref 11.5–15.5)
WBC: 5.8 10*3/uL (ref 4.0–10.5)
nRBC: 0 % (ref 0.0–0.2)

## 2020-03-20 LAB — COMPREHENSIVE METABOLIC PANEL
ALT: 22 U/L (ref 0–44)
AST: 23 U/L (ref 15–41)
Albumin: 3.9 g/dL (ref 3.5–5.0)
Alkaline Phosphatase: 65 U/L (ref 38–126)
Anion gap: 9 (ref 5–15)
BUN: 7 mg/dL (ref 6–20)
CO2: 23 mmol/L (ref 22–32)
Calcium: 9.4 mg/dL (ref 8.9–10.3)
Chloride: 108 mmol/L (ref 98–111)
Creatinine, Ser: 0.69 mg/dL (ref 0.44–1.00)
GFR calc Af Amer: >60 mL/min (ref >60)
GFR calc non Af Amer: >60 mL/min (ref >60)
Glucose, Bld: 101 mg/dL — ABNORMAL HIGH (ref 70–99)
Potassium: 3.7 mmol/L (ref 3.5–5.1)
Sodium: 140 mmol/L (ref 135–145)
Total Bilirubin: 0.5 mg/dL (ref 0.3–1.2)
Total Protein: 7.4 g/dL (ref 6.5–8.1)

## 2020-03-20 LAB — URINALYSIS, ROUTINE W REFLEX MICROSCOPIC
Bacteria, UA: NONE SEEN
Bilirubin Urine: NEGATIVE
Glucose, UA: NEGATIVE mg/dL
Ketones, ur: NEGATIVE mg/dL
Nitrite: NEGATIVE
Protein, ur: NEGATIVE mg/dL
Specific Gravity, Urine: 1.017 (ref 1.005–1.030)
pH: 6 (ref 5.0–8.0)

## 2020-03-20 LAB — RAPID HIV SCREEN (HIV 1/2 AB+AG)
HIV 1/2 Antibodies: NONREACTIVE
HIV-1 P24 Antigen - HIV24: NONREACTIVE

## 2020-03-20 LAB — SARS CORONAVIRUS 2 (TAT 6-24 HRS): SARS Coronavirus 2: NEGATIVE

## 2020-03-20 LAB — HCG, QUANTITATIVE, PREGNANCY: hCG, Beta Chain, Quant, S: <1 m[IU]/mL (ref <5)

## 2020-03-22 ENCOUNTER — Ambulatory Visit (HOSPITAL_COMMUNITY): Payer: Medicaid Other | Admitting: Anesthesiology

## 2020-03-22 ENCOUNTER — Ambulatory Visit (HOSPITAL_COMMUNITY)
Admission: RE | Admit: 2020-03-22 | Discharge: 2020-03-22 | Disposition: A | Discharge status: Home / Self Care | Payer: Medicaid Other | Attending: Obstetrics & Gynecology | Admitting: Obstetrics & Gynecology

## 2020-03-22 ENCOUNTER — Encounter (HOSPITAL_COMMUNITY): Payer: Self-pay | Admitting: Obstetrics & Gynecology

## 2020-03-22 ENCOUNTER — Encounter (HOSPITAL_COMMUNITY)
Admission: RE | Disposition: A | Discharge status: Home / Self Care | Payer: Self-pay | Source: Home / Self Care | Attending: Obstetrics & Gynecology

## 2020-03-22 DIAGNOSIS — F329 Major depressive disorder, single episode, unspecified: Secondary | ICD-10-CM | POA: Insufficient documentation

## 2020-03-22 DIAGNOSIS — Z823 Family history of stroke: Secondary | ICD-10-CM | POA: Insufficient documentation

## 2020-03-22 DIAGNOSIS — Z91013 Allergy to seafood: Secondary | ICD-10-CM | POA: Insufficient documentation

## 2020-03-22 DIAGNOSIS — Z87891 Personal history of nicotine dependence: Secondary | ICD-10-CM | POA: Diagnosis not present

## 2020-03-22 DIAGNOSIS — Z8249 Family history of ischemic heart disease and other diseases of the circulatory system: Secondary | ICD-10-CM | POA: Insufficient documentation

## 2020-03-22 DIAGNOSIS — Z886 Allergy status to analgesic agent status: Secondary | ICD-10-CM | POA: Diagnosis not present

## 2020-03-22 DIAGNOSIS — F419 Anxiety disorder, unspecified: Secondary | ICD-10-CM | POA: Insufficient documentation

## 2020-03-22 DIAGNOSIS — E0590 Thyrotoxicosis, unspecified without thyrotoxic crisis or storm: Secondary | ICD-10-CM | POA: Insufficient documentation

## 2020-03-22 DIAGNOSIS — D649 Anemia, unspecified: Secondary | ICD-10-CM | POA: Diagnosis not present

## 2020-03-22 DIAGNOSIS — N946 Dysmenorrhea, unspecified: Secondary | ICD-10-CM | POA: Insufficient documentation

## 2020-03-22 DIAGNOSIS — N921 Excessive and frequent menstruation with irregular cycle: Secondary | ICD-10-CM | POA: Diagnosis not present

## 2020-03-22 DIAGNOSIS — Z818 Family history of other mental and behavioral disorders: Secondary | ICD-10-CM | POA: Insufficient documentation

## 2020-03-22 DIAGNOSIS — Z8719 Personal history of other diseases of the digestive system: Secondary | ICD-10-CM | POA: Diagnosis not present

## 2020-03-22 DIAGNOSIS — K219 Gastro-esophageal reflux disease without esophagitis: Secondary | ICD-10-CM | POA: Insufficient documentation

## 2020-03-22 DIAGNOSIS — Z91041 Radiographic dye allergy status: Secondary | ICD-10-CM | POA: Diagnosis not present

## 2020-03-22 DIAGNOSIS — R519 Headache, unspecified: Secondary | ICD-10-CM | POA: Insufficient documentation

## 2020-03-22 HISTORY — PX: HYSTEROSCOPY: SHX211

## 2020-03-22 SURGERY — HYSTEROSCOPY
Anesthesia: General | Site: Vagina

## 2020-03-22 MED ORDER — LACTATED RINGERS IV SOLN: INTRAVENOUS | Status: DC

## 2020-03-22 MED ORDER — FENTANYL CITRATE (PF) 100 MCG/2ML IJ SOLN
INTRAMUSCULAR | Status: AC
  Filled 2020-03-22: qty 2

## 2020-03-22 MED ORDER — LIDOCAINE HCL (CARDIAC) PF 50 MG/5ML IV SOSY
PREFILLED_SYRINGE | INTRAVENOUS | Status: DC | PRN
  Administered 2020-03-22: 50 mg via INTRAVENOUS

## 2020-03-22 MED ORDER — FENTANYL CITRATE (PF) 100 MCG/2ML IJ SOLN: 25.0000 ug | INTRAMUSCULAR | Status: DC | PRN

## 2020-03-22 MED ORDER — CEFAZOLIN SODIUM-DEXTROSE 2-4 GM/100ML-% IV SOLN
2.0000 g | INTRAVENOUS | Status: AC
  Administered 2020-03-22: 12:00:00 2 g via INTRAVENOUS
  Filled 2020-03-22: qty 100

## 2020-03-22 MED ORDER — ONDANSETRON HCL 4 MG/2ML IJ SOLN
INTRAMUSCULAR | Status: AC
  Filled 2020-03-22: qty 2

## 2020-03-22 MED ORDER — SODIUM CHLORIDE 0.9 % IR SOLN
Status: DC | PRN
  Administered 2020-03-22: 3000 mL

## 2020-03-22 MED ORDER — LIDOCAINE 2% (20 MG/ML) 5 ML SYRINGE
INTRAMUSCULAR | Status: AC
  Filled 2020-03-22: qty 5

## 2020-03-22 MED ORDER — ONDANSETRON HCL 4 MG/2ML IJ SOLN
INTRAMUSCULAR | Status: DC | PRN
  Administered 2020-03-22: 4 mg via INTRAVENOUS

## 2020-03-22 MED ORDER — 0.9 % SODIUM CHLORIDE (POUR BTL) OPTIME
TOPICAL | Status: DC | PRN
  Administered 2020-03-22: 1000 mL

## 2020-03-22 MED ORDER — PROPOFOL 10 MG/ML IV BOLUS
INTRAVENOUS | Status: DC | PRN
  Administered 2020-03-22: 200 mg via INTRAVENOUS

## 2020-03-22 MED ORDER — MIDAZOLAM HCL 2 MG/2ML IJ SOLN
INTRAMUSCULAR | Status: AC
  Filled 2020-03-22: qty 2

## 2020-03-22 MED ORDER — ONDANSETRON 8 MG PO TBDP: 8.0000 mg | ORAL_TABLET | Freq: Three times a day (TID) | ORAL | 0 refills | Status: DC | PRN

## 2020-03-22 MED ORDER — HYDROCODONE-ACETAMINOPHEN 5-325 MG PO TABS: 1.0000 | ORAL_TABLET | Freq: Four times a day (QID) | ORAL | 0 refills | Status: DC | PRN

## 2020-03-22 MED ORDER — FENTANYL CITRATE (PF) 100 MCG/2ML IJ SOLN
INTRAMUSCULAR | Status: DC | PRN
  Administered 2020-03-22: 50 ug via INTRAVENOUS
  Administered 2020-03-22: 25 ug via INTRAVENOUS

## 2020-03-22 MED ORDER — MIDAZOLAM HCL 5 MG/5ML IJ SOLN
INTRAMUSCULAR | Status: DC | PRN
  Administered 2020-03-22: 2 mg via INTRAVENOUS

## 2020-03-22 SURGICAL SUPPLY — 28 items
BAG HAMPER (MISCELLANEOUS) ×4 IMPLANT
CLOTH BEACON ORANGE TIMEOUT ST (SAFETY) ×4 IMPLANT
COVER LIGHT HANDLE STERIS (MISCELLANEOUS) ×8 IMPLANT
COVER WAND RF STERILE (DRAPES) ×4 IMPLANT
GAUZE 4X4 16PLY RFD (DISPOSABLE) ×8 IMPLANT
GLOVE BIOGEL PI IND STRL 7.0 (GLOVE) ×4 IMPLANT
GLOVE BIOGEL PI IND STRL 8 (GLOVE) ×2 IMPLANT
GLOVE BIOGEL PI INDICATOR 7.0 (GLOVE) ×4
GLOVE BIOGEL PI INDICATOR 8 (GLOVE) ×2
GLOVE ECLIPSE 6.5 STRL STRAW (GLOVE) ×2 IMPLANT
GLOVE ECLIPSE 8.0 STRL XLNG CF (GLOVE) ×4 IMPLANT
GOWN STRL REUS W/TWL LRG LVL3 (GOWN DISPOSABLE) ×4 IMPLANT
GOWN STRL REUS W/TWL XL LVL3 (GOWN DISPOSABLE) ×4 IMPLANT
HANDPIECE ABLA MINERVA ENDO (MISCELLANEOUS) ×4 IMPLANT
INST SET HYSTEROSCOPY (KITS) ×4 IMPLANT
IV NS IRRIG 3000ML ARTHROMATIC (IV SOLUTION) ×2 IMPLANT
KIT TURNOVER CYSTO (KITS) ×4 IMPLANT
MANIFOLD NEPTUNE II (INSTRUMENTS) ×4 IMPLANT
MARKER SKIN DUAL TIP RULER LAB (MISCELLANEOUS) ×4 IMPLANT
NS IRRIG 1000ML POUR BTL (IV SOLUTION) ×4 IMPLANT
PACK BASIC III (CUSTOM PROCEDURE TRAY) ×4
PACK SRG BSC III STRL LF ECLPS (CUSTOM PROCEDURE TRAY) ×2 IMPLANT
PAD ARMBOARD 7.5X6 YLW CONV (MISCELLANEOUS) ×4 IMPLANT
PAD TELFA 3X4 1S STER (GAUZE/BANDAGES/DRESSINGS) ×4 IMPLANT
SET BASIN LINEN APH (SET/KITS/TRAYS/PACK) ×4 IMPLANT
SET IRRIG Y TYPE TUR BLADDER L (SET/KITS/TRAYS/PACK) ×4 IMPLANT
SHEET LAVH (DRAPES) ×4 IMPLANT
YANKAUER SUCT BULB TIP 10FT TU (MISCELLANEOUS) ×4 IMPLANT

## 2020-03-22 NOTE — H&P (Signed)
Preoperative History and Physical  Laurie Howell is a 44 y.o. [id number] with Patient's last menstrual period was 02/29/2020. admitted for a hysteroscopy uterine curettage Minerva endometrial ablation.   Laurie Howell is a 44 y.o. [phone] with Patient's last menstrual period was 02/29/2020. admitted for a hysteroscopy uterine curettage Minerva ablation.  Long history of menometrorrhagia dysmenorrhea Also reports dyspareunia but cannot avoid lifting if she were to have a TVH Opts for ablation not TVH to solve her bump dyspareunia as well as her menometrorrhagia and dysmenorrhea  PMH:    Past Medical History:  Diagnosis Date  . Anemia   . Chronic gastritis   . Esophageal stricture   . Fibroid 06/18/2017  . Frequent UTI   . GERD (gastroesophageal reflux disease)   . Headache   . Iron deficiency anemia 03/28/2016   due to menorrhagia  . Ovarian cyst     PSH:     Past Surgical History:  Procedure Laterality Date  . ESOPHAGOGASTRODUODENOSCOPY N/A 06/27/2016   Procedure: ESOPHAGOGASTRODUODENOSCOPY (EGD);  Surgeon: Danie Binder, MD;  Location: [facility];  Service: Endoscopy;  Laterality: N/A;  1245  . SAVORY DILATION N/A 06/27/2016   Procedure: SAVORY DILATION;  Surgeon: Danie Binder, MD;  Location: [facility];  Service: Endoscopy;  Laterality: N/A;  . TUBAL LIGATION  1997  . Tubal reversal  02/2015   [facility] - Dr Karie Kirks    POb/GynH:      OB History    Gravida  6   Para  2   Term  2   Preterm      AB  4   Living  2     SAB  4   TAB      Ectopic      Multiple      Live Births  2           SH:   Social History   Tobacco Use  . Smoking status: Former Smoker    Years: 1.00    Types: Cigarettes    Quit date: 12/30/1994    Years since quitting: 25.2  . Smokeless tobacco: Never Used  . Tobacco comment: 2-3 daily  Substance Use Topics  . Alcohol use: No  . Drug use: No    FH:    Family History  Problem Relation Age of Onset  . Depression  Mother   . Hypertension Mother   . Other Mother        open heart surgery  . Heart disease Father   . Hypertension Father   . Anxiety disorder Father   . Heart attack Father   . Stroke Maternal Grandmother   . Colon cancer Neg Hx      Allergies:  Allergies  Allergen Reactions  . Ivp Dye [Iodinated Diagnostic Agents] Anaphylaxis  . Shellfish Allergy Anaphylaxis  . Aspirin Swelling and Other (See Comments)    Reaction:  Facial swelling     Medications:       Current Facility-Administered Medications:  .  ceFAZolin (ANCEF) IVPB 2g/100 mL premix, 2 g, Intravenous, On Call to OR, Florian Buff, MD  Review of Systems:   Review of Systems  Constitutional: Negative for fever, chills, weight loss, malaise/fatigue and diaphoresis.  HENT: Negative for hearing loss, ear pain, nosebleeds, congestion, sore throat, neck pain, tinnitus and ear discharge.   Eyes: Negative for blurred vision, double vision, photophobia, pain, discharge and redness.  Respiratory: Negative for cough, hemoptysis, sputum production, shortness of  breath, wheezing and stridor.   Cardiovascular: Negative for chest pain, palpitations, orthopnea, claudication, leg swelling and PND.  Gastrointestinal: Positive for abdominal pain. Negative for heartburn, nausea, vomiting, diarrhea, constipation, blood in stool and melena.  Genitourinary: Negative for dysuria, urgency, frequency, hematuria and flank pain.  Musculoskeletal: Negative for myalgias, back pain, joint pain and falls.  Skin: Negative for itching and rash.  Neurological: Negative for dizziness, tingling, tremors, sensory change, speech change, focal weakness, seizures, loss of consciousness, weakness and headaches.  Endo/Heme/Allergies: Negative for environmental allergies and polydipsia. Does not bruise/bleed easily.  Psychiatric/Behavioral: Negative for depression, suicidal ideas, hallucinations, memory loss and substance abuse. The patient is not  nervous/anxious and does not have insomnia.      PHYSICAL EXAM:  Blood pressure 115/74, pulse 75, temperature 98.1 F (36.7 C), temperature source Oral, resp. rate 20, height 5\' 2"  (1.575 m), weight 60.8 kg, last menstrual period 02/29/2020, SpO2 100 %.    Vitals reviewed. Constitutional: She is oriented to person, place, and time. She appears well-developed and well-nourished.  HENT:  Head: Normocephalic and atraumatic.  Right Ear: External ear normal.  Left Ear: External ear normal.  Nose: Nose normal.  Mouth/Throat: Oropharynx is clear and moist.  Eyes: Conjunctivae and EOM are normal. Pupils are equal, round, and reactive to light. Right eye exhibits no discharge. Left eye exhibits no discharge. No scleral icterus.  Neck: Normal range of motion. Neck supple. No tracheal deviation present. No thyromegaly present.  Cardiovascular: Normal rate, regular rhythm, normal heart sounds and intact distal pulses.  Exam reveals no gallop and no friction rub.   No murmur heard. Respiratory: Effort normal and breath sounds normal. No respiratory distress. She has no wheezes. She has no rales. She exhibits no tenderness.  GI: Soft. Bowel sounds are normal. She exhibits no distension and no mass. There is tenderness. There is no rebound and no guarding.  Genitourinary:       Vulva is normal without lesions Vagina is pink moist without discharge Cervix normal in appearance and pap is normal Uterus is normal size, contour, position, consistency, mobility, non-tender Adnexa is negative with normal sized ovaries by sonogram  Musculoskeletal: Normal range of motion. She exhibits no edema and no tenderness.  Neurological: She is alert and oriented to person, place, and time. She has normal reflexes. She displays normal reflexes. No cranial nerve deficit. She exhibits normal muscle tone. Coordination normal.  Skin: Skin is warm and dry. No rash noted. No erythema. No pallor.  Psychiatric: She has a  normal mood and affect. Her behavior is normal. Judgment and thought content normal.    Labs: Results for orders placed or performed during the hospital encounter of 03/20/20 (from the past 336 hour(s))  CBC   Collection Time: 03/20/20 11:43 AM  Result Value Ref Range   WBC 5.8 4.0 - 10.5 K/uL   RBC 4.23 3.87 - 5.11 MIL/uL   Hemoglobin 12.2 12.0 - 15.0 g/dL   HCT 38.7 36.0 - 46.0 %   MCV 91.5 80.0 - 100.0 fL   MCH 28.8 26.0 - 34.0 pg   MCHC 31.5 30.0 - 36.0 g/dL   RDW 14.3 11.5 - 15.5 %   Platelets 333 150 - 400 K/uL   nRBC 0.0 0.0 - 0.2 %  Comprehensive metabolic panel   Collection Time: 03/20/20 11:43 AM  Result Value Ref Range   Sodium 140 135 - 145 mmol/L   Potassium 3.7 3.5 - 5.1 mmol/L   Chloride 108 98 -  111 mmol/L   CO2 23 22 - 32 mmol/L   Glucose, Bld 101 (H) 70 - 99 mg/dL   BUN 7 6 - 20 mg/dL   Creatinine, Ser 0.69 0.44 - 1.00 mg/dL   Calcium 9.4 8.9 - 10.3 mg/dL   Total Protein 7.4 6.5 - 8.1 g/dL   Albumin 3.9 3.5 - 5.0 g/dL   AST 23 15 - 41 U/L   ALT 22 0 - 44 U/L   Alkaline Phosphatase 65 38 - 126 U/L   Total Bilirubin 0.5 0.3 - 1.2 mg/dL   GFR calc non Af Amer >60 >60 mL/min   GFR calc Af Amer >60 >60 mL/min   Anion gap 9 5 - 15  hCG, quantitative, pregnancy   Collection Time: 03/20/20 11:43 AM  Result Value Ref Range   hCG, Beta Chain, Quant, S <1 <5 mIU/mL  Rapid HIV screen (HIV 1/2 Ab+Ag)   Collection Time: 03/20/20 11:44 AM  Result Value Ref Range   HIV-1 P24 Antigen - HIV24 NON REACTIVE NON REACTIVE   HIV 1/2 Antibodies NON REACTIVE NON REACTIVE   Interpretation (HIV Ag Ab)      A non reactive test result means that HIV 1 or HIV 2 antibodies and HIV 1 p24 antigen were not detected in the specimen.  Urinalysis, Routine w reflex microscopic   Collection Time: 03/20/20 11:44 AM  Result Value Ref Range   Color, Urine AMBER (A) YELLOW   APPearance CLOUDY (A) CLEAR   Specific Gravity, Urine 1.017 1.005 - 1.030   pH 6.0 5.0 - 8.0   Glucose, UA  NEGATIVE NEGATIVE mg/dL   Hgb urine dipstick MODERATE (A) NEGATIVE   Bilirubin Urine NEGATIVE NEGATIVE   Ketones, ur NEGATIVE NEGATIVE mg/dL   Protein, ur NEGATIVE NEGATIVE mg/dL   Nitrite NEGATIVE NEGATIVE   Leukocytes,Ua TRACE (A) NEGATIVE   RBC / HPF 6-10 0 - 5 RBC/hpf   WBC, UA 6-10 0 - 5 WBC/hpf   Bacteria, UA NONE SEEN NONE SEEN   Squamous Epithelial / LPF 11-20 0 - 5   Mucus PRESENT   Results for orders placed or performed during the hospital encounter of 03/20/20 (from the past 336 hour(s))  SARS CORONAVIRUS 2 (TAT 6-24 HRS) Nasopharyngeal Nasopharyngeal Swab   Collection Time: 03/20/20  7:21 AM   Specimen: Nasopharyngeal Swab  Result Value Ref Range   SARS Coronavirus 2 NEGATIVE NEGATIVE    EKG: No orders found for this or any previous visit.  Imaging Studies: GYNECOLOGIC SONOGRAM   Laurie Howell is a 44 y.o. [id number] Patient's last menstrual period was 12/29/2019.she is here for a pelvic sonogram for menorrhagia/pelvic pain.  Uterus                      7.9 x 4.3 x 6.9 cm, Total uterine volume 124 cc, homogeneous anteverted uterus with a small intermural fibroid mid/left uterus .5 x .7 x .5 cm  Endometrium          2.5 mm, symmetrical, wnl  Right ovary             1.8 x 2 x 1.2 cm, wnl  Left ovary                2.4 x 1.9 x 1.4 cm, wnl  No free fluid   Technician Comments:  PELVIC US TA/TV: homogeneous anteverted uterus with a small intermural fibroid mid/left uterus .5 x .7 x .5 cm,EEC 2.5 mm,normal ovaries,ovaries appear mobile,no  free fluid,no pain during ultrasound     [employer] 01/20/2020 4:53 PM  Clinical Impression and recommendations:  I have reviewed the sonogram results above, combined with the patient's current clinical course, below are my impressions and any appropriate recommendations for management based on the sonographic findings.  Uterus is normal size shape contour Endometrium is normal thin Both ovaries are  normal  Normal pelvic sonogram   Florian Buff 01/22/2020 4:34 AM      Assessment: Menometrorrhagia Dysmenorrhea  Plan: Hysteroscopy uterine curettage with Minerva endometrial ablation  Florian Buff 03/22/2020 10:47 AM

## 2020-03-22 NOTE — Op Note (Signed)
Preoperative diagnosis:  1.   Menometrorrhagia                                         2.  Dysmenorrhea   Postoperative diagnoses: Same as above   Procedure: Hysteroscopy, diagnostic, endometrial ablation using Minerva  Surgeon: Florian Buff   Anesthesia: Laryngeal mask airway  Findings: The endometrium was normal. There were no fibroid or other abnormalities.  Description of operation: The patient was taken to the operating room and placed in the supine position. She underwent general anesthesia using the laryngeal mask airway. She was placed in the dorsal lithotomy position and prepped and draped in the usual sterile fashion. A Graves speculum was placed and the anterior cervical lip was grasped with a single-tooth tenaculum. The cervix was dilated serially to allow passage of the hysteroscope. Diagnostic hysteroscopy was performed and was found to be normal. A  uterine curettage was not performed because the endometrium was very thn homogenous  I then proceeded to perform the Minerva endometrial ablation.   The uterus sounded to 9 cm The handpiece was attached to the Minerva power source/machine and the handpiece passed the checklist. The array was squeezed down to remove all of the air present.  The array was then place into the endometrial cavity and deployed to a length of 6.0 cm. The handpiece confirmed appropriate width by being in the green portion of the visual dial. The cervical cuff was then inflated to the point the CO2 indicator was in the green. The endometrial integrity check was then performed and integrity sequence was confirmed x 2. The heating was then begun and carried out for a total of 2 minutes(which is standard therapy time). When the plasma cycle was finished,  the cervical cuff was deflated and the array was removed with tissue present on the silicon membrane. There was appropriate post Minerva bleeding and uterine discharge.     All of the equipment worked  well throughout the procedure.  The patient was awakened from anesthesia and taken to the recovery room in good stable condition all counts were correct. She received 2 g of Ancef  preoperatively. She will be discharged from the recovery room and followed up in the office in 1- 2 weeks.   She can expect 4 weeks of post procedure bloody watery discharge  Florian Buff, MD  03/22/2020 11:57 AM

## 2020-03-22 NOTE — Discharge Instructions (Signed)
Endometrial Ablation Endometrial ablation is a procedure that destroys the thin inner layer of the lining of the uterus (endometrium). This procedure may be done:  To stop heavy periods.  To stop bleeding that is causing anemia.  To control irregular bleeding.  To treat bleeding caused by small tumors (fibroids) in the endometrium. This procedure is often an alternative to major surgery, such as removal of the uterus and cervix (hysterectomy). As a result of this procedure:  You may not be able to have children. However, if you are premenopausal (you have not gone through menopause): ? You may still have a small chance of getting pregnant. ? You will need to use a reliable method of birth control after the procedure to prevent pregnancy.  You may stop having a menstrual period, or you may have only a small amount of bleeding during your period. Menstruation may return several years after the procedure. Tell a health care provider about:  Any allergies you have.  All medicines you are taking, including vitamins, herbs, eye drops, creams, and over-the-counter medicines.  Any problems you or family members have had with the use of anesthetic medicines.  Any blood disorders you have.  Any surgeries you have had.  Any medical conditions you have. What are the risks? Generally, this is a safe procedure. However, problems may occur, including:  A hole (perforation) in the uterus or bowel.  Infection of the uterus, bladder, or vagina.  Bleeding.  Damage to other structures or organs.  An air bubble in the lung (air embolus).  Problems with pregnancy after the procedure.  Failure of the procedure.  Decreased ability to diagnose cancer in the endometrium. What happens before the procedure?  You will have tests of your endometrium to make sure there are no pre-cancerous cells or cancer cells present.  You may have an ultrasound of the uterus.  You may be given medicines to  thin the endometrium.  Ask your health care provider about: ? Changing or stopping your regular medicines. This is especially important if you take diabetes medicines or blood thinners. ? Taking medicines such as aspirin and ibuprofen. These medicines can thin your blood. Do not take these medicines before your procedure if your doctor tells you not to.  Plan to have someone take you home from the hospital or clinic. What happens during the procedure?   You will lie on an exam table with your feet and legs supported as in a pelvic exam.  To lower your risk of infection: ? Your health care team will wash or sanitize their hands and put on germ-free (sterile) gloves. ? Your genital area will be washed with soap.  An IV tube will be inserted into one of your veins.  You will be given a medicine to help you relax (sedative).  A surgical instrument with a light and camera (resectoscope) will be inserted into your vagina and moved into your uterus. This allows your surgeon to see inside your uterus.  Endometrial tissue will be removed using one of the following methods: ? Radiofrequency. This method uses a radiofrequency-alternating electric current to remove the endometrium. ? Cryotherapy. This method uses extreme cold to freeze the endometrium. ? Heated-free liquid. This method uses a heated saltwater (saline) solution to remove the endometrium. ? Microwave. This method uses high-energy microwaves to heat up the endometrium and remove it. ? Thermal balloon. This method involves inserting a catheter with a balloon tip into the uterus. The balloon tip is filled with   heated fluid to remove the endometrium. The procedure may vary among health care providers and hospitals. What happens after the procedure?  Your blood pressure, heart rate, breathing rate, and blood oxygen level will be monitored until the medicines you were given have worn off.  As tissue healing occurs, you may notice  vaginal bleeding for 4-6 weeks after the procedure. You may also experience: ? Cramps. ? Thin, watery vaginal discharge that is light pink or brown in color. ? A need to urinate more frequently than usual. ? Nausea.  Do not drive for 24 hours if you were given a sedative.  Do not have sex or insert anything into your vagina until your health care provider approves. Summary  Endometrial ablation is done to treat the many causes of heavy menstrual bleeding.  The procedure may be done only after medications have been tried to control the bleeding.  Plan to have someone take you home from the hospital or clinic. This information is not intended to replace advice given to you by your health care provider. Make sure you discuss any questions you have with your health care provider. Document Revised: 06/02/2018 Document Reviewed: 01/02/2017 Elsevier Patient Education  [place].  Dilation and Curettage, Care After These instructions give you information about caring for yourself after your procedure. Your doctor may also give you more specific instructions. Call your doctor if you have any problems or questions after your procedure. Follow these instructions at home: Activity  Do not drive or use heavy machinery while taking prescription pain medicine.  For 24 hours after your procedure, avoid driving.  Take short walks often, followed by rest periods. Ask your doctor what activities are safe for you. After one or two days, you may be able to return to your normal activities.  Do not lift anything that is heavier than 10 lb (4.5 kg) until your doctor approves.  For at least 2 weeks, or as long as told by your doctor: ? Do not douche. ? Do not use tampons. ? Do not have sex. General instructions   Take over-the-counter and prescription medicines only as told by your doctor. This is very important if you take blood thinning medicine.  Do not take baths, swim, or use a hot  tub until your doctor approves. Take showers instead of baths.  Wear compression stockings as told by your doctor.  It is up to you to get the results of your procedure. Ask your doctor when your results will be ready.  Keep all follow-up visits as told by your doctor. This is important. Contact a doctor if:  You have very bad cramps that get worse or do not get better with medicine.  You have very bad pain in your belly (abdomen).  You cannot drink fluids without throwing up (vomiting).  You get pain in a different part of the area between your belly and thighs (pelvis).  You have bad-smelling discharge from your vagina.  You have a rash. Get help right away if:  You are bleeding a lot from your vagina. A lot of bleeding means soaking more than one sanitary pad in an hour, for 2 hours in a row.  You have clumps of blood (blood clots) coming from your vagina.  You have a fever or chills.  Your belly feels very tender or hard.  You have chest pain.  You have trouble breathing.  You cough up blood.  You feel dizzy.  You feel light-headed.  You pass  out (faint).  You have pain in your neck or shoulder area. Summary  Take short walks often, followed by rest periods. Ask your doctor what activities are safe for you. After one or two days, you may be able to return to your normal activities.  Do not lift anything that is heavier than 10 lb (4.5 kg) until your doctor approves.  Do not take baths, swim, or use a hot tub until your doctor approves. Take showers instead of baths.  Contact your doctor if you have any symptoms of infection, like bad-smelling discharge from your vagina. This information is not intended to replace advice given to you by your health care provider. Make sure you discuss any questions you have with your health care provider. Document Revised: 11/28/2017 Document Reviewed: 09/02/2016 Elsevier Patient Education  2020 [place]  Anesthesia, Adult, Care After This sheet gives you information about how to care for yourself after your procedure. Your health care provider may also give you more specific instructions. If you have problems or questions, contact your health care provider. What can I expect after the procedure? After the procedure, the following side effects are common:  Pain or discomfort at the IV site.  Nausea.  Vomiting.  Sore throat.  Trouble concentrating.  Feeling cold or chills.  Weak or tired.  Sleepiness and fatigue.  Soreness and body aches. These side effects can affect parts of the body that were not involved in surgery. Follow these instructions at home:  For at least 24 hours after the procedure:  Have a responsible adult stay with you. It is important to have someone help care for you until you are awake and alert.  Rest as needed.  Do not: ? Participate in activities in which you could fall or become injured. ? Drive. ? Use heavy machinery. ? Drink alcohol. ? Take sleeping pills or medicines that cause drowsiness. ? Make important decisions or sign legal documents. ? Take care of children on your own. Eating and drinking  Follow any instructions from your health care provider about eating or drinking restrictions.  When you feel hungry, start by eating small amounts of foods that are soft and easy to digest (bland), such as toast. Gradually return to your regular diet.  Drink enough fluid to keep your urine pale yellow.  If you vomit, rehydrate by drinking water, juice, or clear broth. General instructions  If you have sleep apnea, surgery and certain medicines can increase your risk for breathing problems. Follow instructions from your health care provider about wearing your sleep device: ? Anytime you are sleeping, including during daytime naps. ? While taking prescription pain medicines, sleeping medicines, or medicines that make you drowsy.  Return to your  normal activities as told by your health care provider. Ask your health care provider what activities are safe for you.  Take over-the-counter and prescription medicines only as told by your health care provider.  If you smoke, do not smoke without supervision.  Keep all follow-up visits as told by your health care provider. This is important. Contact a health care provider if:  You have nausea or vomiting that does not get better with medicine.  You cannot eat or drink without vomiting.  You have pain that does not get better with medicine.  You are unable to pass urine.  You develop a skin rash.  You have a fever.  You have redness around your IV site that gets worse. Get help right away if:  You have difficulty breathing.  You have chest pain.  You have blood in your urine or stool, or you vomit blood. Summary  After the procedure, it is common to have a sore throat or nausea. It is also common to feel tired.  Have a responsible adult stay with you for the first 24 hours after general anesthesia. It is important to have someone help care for you until you are awake and alert.  When you feel hungry, start by eating small amounts of foods that are soft and easy to digest (bland), such as toast. Gradually return to your regular diet.  Drink enough fluid to keep your urine pale yellow.  Return to your normal activities as told by your health care provider. Ask your health care provider what activities are safe for you. This information is not intended to replace advice given to you by your health care provider. Make sure you discuss any questions you have with your health care provider. Document Revised: 12/19/2017 Document Reviewed: 08/01/2017 Elsevier Patient Education  [place].

## 2020-03-22 NOTE — Anesthesia Postprocedure Evaluation (Signed)
Anesthesia Post Note  Patient: Laurie Howell  Procedure(s) Performed: HYSTEROSCOPY WITH MINERVA ENDOMETRIAL ABLATION (N/A Vagina )  Patient location during evaluation: PACU Anesthesia Type: General Level of consciousness: awake and alert Pain management: pain level controlled Vital Signs Assessment: post-procedure vital signs reviewed and stable Respiratory status: spontaneous breathing Cardiovascular status: stable and blood pressure returned to baseline Postop Assessment: no apparent nausea or vomiting Anesthetic complications: no     Last Vitals:  Vitals:   03/22/20 0922  BP: 115/74  Pulse: 75  Resp: 20  Temp: 36.7 C  SpO2: 100%    Last Pain:  Vitals:   03/22/20 0922  TempSrc: Oral  PainSc:                  Tressie Stalker

## 2020-03-22 NOTE — Anesthesia Procedure Notes (Signed)
Procedure Name: LMA Insertion Date/Time: 03/22/2020 11:32 AM Performed by: Ollen Bowl, CRNA Pre-anesthesia Checklist: Patient identified, Patient being monitored, Emergency Drugs available, Timeout performed and Suction available Patient Re-evaluated:Patient Re-evaluated prior to induction Oxygen Delivery Method: Circle System Utilized Preoxygenation: Pre-oxygenation with 100% oxygen Induction Type: IV induction Ventilation: Mask ventilation without difficulty LMA: LMA inserted LMA Size: 4.0 Number of attempts: 1 Placement Confirmation: positive ETCO2 and breath sounds checked- equal and bilateral

## 2020-03-22 NOTE — Anesthesia Preprocedure Evaluation (Signed)
Anesthesia Evaluation  Patient identified by MRN, date of birth, ID band Patient awake    Reviewed: Allergy & Precautions, H&P , NPO status , Patient's Chart, lab work & pertinent test results  Airway Mallampati: I       Dental  (+) Teeth Intact   Pulmonary neg pulmonary ROS, former smoker,    Pulmonary exam normal        Cardiovascular negative cardio ROS Normal cardiovascular exam     Neuro/Psych  Headaches, PSYCHIATRIC DISORDERS Anxiety Depression    GI/Hepatic Neg liver ROS, GERD  ,  Endo/Other  Hyperthyroidism   Renal/GU negative Renal ROS     Musculoskeletal   Abdominal   Peds  Hematology  (+) Blood dyscrasia, anemia ,   Anesthesia Other Findings   Reproductive/Obstetrics negative OB ROS                             Anesthesia Physical Anesthesia Plan  ASA: II  Anesthesia Plan: General   Post-op Pain Management:    Induction:   PONV Risk Score and Plan: 2 and Ondansetron  Airway Management Planned:   Additional Equipment:   Intra-op Plan:   Post-operative Plan:   Informed Consent: I have reviewed the patients History and Physical, chart, labs and discussed the procedure including the risks, benefits and alternatives for the proposed anesthesia with the patient or authorized representative who has indicated his/her understanding and acceptance.       Plan Discussed with: CRNA  Anesthesia Plan Comments:         Anesthesia Quick Evaluation

## 2020-03-22 NOTE — Transfer of Care (Signed)
Immediate Anesthesia Transfer of Care Note  Patient: Laurie Howell  Procedure(s) Performed: HYSTEROSCOPY WITH MINERVA ENDOMETRIAL ABLATION (N/A Vagina )  Patient Location: PACU  Anesthesia Type:General  Level of Consciousness: awake and alert   Airway & Oxygen Therapy: Patient Spontanous Breathing  Post-op Assessment: Report given to RN  Post vital signs: Reviewed and stable  Last Vitals:  Vitals Value Taken Time  BP 113/76 03/22/20 1210  Temp    Pulse 81 03/22/20 1213  Resp 17 03/22/20 1213  SpO2 98 % 03/22/20 1213  Vitals shown include unvalidated device data.  Last Pain:  Vitals:   03/22/20 0922  TempSrc: Oral  PainSc:          Complications: No apparent anesthesia complications

## 2020-03-23 ENCOUNTER — Other Ambulatory Visit: Payer: Self-pay | Admitting: Adult Health

## 2020-03-28 ENCOUNTER — Other Ambulatory Visit: Payer: Self-pay

## 2020-03-28 ENCOUNTER — Ambulatory Visit (INDEPENDENT_AMBULATORY_CARE_PROVIDER_SITE_OTHER): Payer: Medicaid Other | Admitting: Obstetrics & Gynecology

## 2020-03-28 ENCOUNTER — Encounter: Payer: Self-pay | Admitting: Obstetrics & Gynecology

## 2020-03-28 VITALS — BP 121/80 | HR 77 | Ht 62.0 in | Wt 137.0 lb

## 2020-03-28 DIAGNOSIS — Z9889 Other specified postprocedural states: Secondary | ICD-10-CM

## 2020-03-28 NOTE — Progress Notes (Signed)
  HPI: Patient returns for routine postoperative follow-up having undergone endo ablation on 3/24.  The patient's immediate postoperative recovery has been unremarkable. Since hospital discharge the patient reports no problems.   Current Outpatient Medications: .  butalbital-acetaminophen-caffeine (FIORICET) 50-325-40 MG tablet, TAKE 1-2 TABLETS BY MOUTH EVERY 6 (SIX) HOURS AS NEEDED FOR HEADACHE., Disp: 20 tablet, Rfl: 0 .  HYDROcodone-acetaminophen (NORCO/VICODIN) 5-325 MG tablet, Take 1 tablet by mouth every 6 (six) hours as needed., Disp: 15 tablet, Rfl: 0 .  ondansetron (ZOFRAN ODT) 8 MG disintegrating tablet, Take 1 tablet (8 mg total) by mouth every 8 (eight) hours as needed for nausea or vomiting., Disp: 8 tablet, Rfl: 0 .  prenatal vitamin w/FE, FA (PRENATAL 1 + 1) 27-1 MG TABS tablet, Take 1 tablet by mouth daily at 12 noon., Disp: 30 tablet, Rfl: 6  No current facility-administered medications for this visit.    Blood pressure 121/80, pulse 77, height 5\' 2"  (1.575 m), weight 137 lb (62.1 kg), last menstrual period 02/29/2020.  Physical Exam: Normal post ablation  Diagnostic Tests:   Pathology: n/a  Impression: S/p endometrial ablation w/Minerva  Plan: No sex 3 weeks total  Follow up: prn    Florian Buff, MD

## 2020-04-14 ENCOUNTER — Ambulatory Visit (INDEPENDENT_AMBULATORY_CARE_PROVIDER_SITE_OTHER): Payer: Medicaid Other | Admitting: Family Medicine

## 2020-04-14 ENCOUNTER — Encounter: Payer: Self-pay | Admitting: Family Medicine

## 2020-04-14 DIAGNOSIS — R3989 Other symptoms and signs involving the genitourinary system: Secondary | ICD-10-CM | POA: Diagnosis not present

## 2020-04-14 LAB — URINALYSIS, COMPLETE
Bilirubin, UA: NEGATIVE
Glucose, UA: NEGATIVE
Ketones, UA: NEGATIVE
Nitrite, UA: POSITIVE — AB
Protein,UA: NEGATIVE
Specific Gravity, UA: 1.02 (ref 1.005–1.030)
Urobilinogen, Ur: 1 mg/dL (ref 0.2–1.0)
pH, UA: 7 (ref 5.0–7.5)

## 2020-04-14 LAB — MICROSCOPIC EXAMINATION: Mucus, UA: NONE SEEN

## 2020-04-14 MED ORDER — NITROFURANTOIN MONOHYD MACRO 100 MG PO CAPS: 100.0000 mg | ORAL_CAPSULE | Freq: Two times a day (BID) | ORAL | 0 refills | Status: AC

## 2020-04-14 NOTE — Patient Instructions (Signed)
Urinary Tract Infection, Adult A urinary tract infection (UTI) is an infection of any part of the urinary tract. The urinary tract includes:  The kidneys.  The ureters.  The bladder.  The urethra. These organs make, store, and get rid of pee (urine) in the body. What are the causes? This is caused by germs (bacteria) in your genital area. These germs grow and cause swelling (inflammation) of your urinary tract. What increases the risk? You are more likely to develop this condition if:  You have a small, thin tube (catheter) to drain pee.  You cannot control when you pee or poop (incontinence).  You are female, and: ? You use these methods to prevent pregnancy:  A medicine that kills sperm (spermicide).  A device that blocks sperm (diaphragm). ? You have low levels of a female hormone (estrogen). ? You are pregnant.  You have genes that add to your risk.  You are sexually active.  You take antibiotic medicines.  You have trouble peeing because of: ? A prostate that is bigger than normal, if you are female. ? A blockage in the part of your body that drains pee from the bladder (urethra). ? A kidney stone. ? A nerve condition that affects your bladder (neurogenic bladder). ? Not getting enough to drink. ? Not peeing often enough.  You have other conditions, such as: ? Diabetes. ? A weak disease-fighting system (immune system). ? Sickle cell disease. ? Gout. ? Injury of the spine. What are the signs or symptoms? Symptoms of this condition include:  Needing to pee right away (urgently).  Peeing often.  Peeing small amounts often.  Pain or burning when peeing.  Blood in the pee.  Pee that smells bad or not like normal.  Trouble peeing.  Pee that is cloudy.  Fluid coming from the vagina, if you are female.  Pain in the belly or lower back. Other symptoms include:  Throwing up (vomiting).  No urge to eat.  Feeling mixed up (confused).  Being tired  and grouchy (irritable).  A fever.  Watery poop (diarrhea). How is this treated? This condition may be treated with:  Antibiotic medicine.  Other medicines.  Drinking enough water. Follow these instructions at home:  Medicines  Take over-the-counter and prescription medicines only as told by your doctor.  If you were prescribed an antibiotic medicine, take it as told by your doctor. Do not stop taking it even if you start to feel better. General instructions  Make sure you: ? Pee until your bladder is empty. ? Do not hold pee for a long time. ? Empty your bladder after sex. ? Wipe from front to back after pooping if you are a female. Use each tissue one time when you wipe.  Drink enough fluid to keep your pee pale yellow.  Keep all follow-up visits as told by your doctor. This is important. Contact a doctor if:  You do not get better after 1-2 days.  Your symptoms go away and then come back. Get help right away if:  You have very bad back pain.  You have very bad pain in your lower belly.  You have a fever.  You are sick to your stomach (nauseous).  You are throwing up. Summary  A urinary tract infection (UTI) is an infection of any part of the urinary tract.  This condition is caused by germs in your genital area.  There are many risk factors for a UTI. These include having a small, thin   tube to drain pee and not being able to control when you pee or poop.  Treatment includes antibiotic medicines for germs.  Drink enough fluid to keep your pee pale yellow. This information is not intended to replace advice given to you by your health care provider. Make sure you discuss any questions you have with your health care provider. Document Revised: 12/03/2018 Document Reviewed: 06/25/2018 Elsevier Patient Education  2020 [employer]  

## 2020-04-14 NOTE — Progress Notes (Signed)
Virtual Visit via Telephone Note  I connected with Laurie Howell on 04/14/20 at 8:56 AM by telephone and verified that I am speaking with the correct person using two identifiers. Laurie Howell is currently located at home and nobody is currently with her during this visit. The provider, Loman Brooklyn, FNP is located in their home at time of visit.  I discussed the limitations, risks, security and privacy concerns of performing an evaluation and management service by telephone and the availability of in person appointments. I also discussed with the patient that there may be a patient responsible charge related to this service. The patient expressed understanding and agreed to proceed.  Subjective: PCP: Baruch Gouty, FNP  Chief Complaint  Patient presents with  . Urinary Tract Infection   Urinary Tract Infection: Patient complains of dysuria and frequency She has had symptoms for 1 day. Patient also complains of back pain. Patient denies fever and stomach ache. Patient does have a history of recurrent UTI.  Patient does not have a history of pyelonephritis.    ROS: Per HPI  Current Outpatient Medications:  .  butalbital-acetaminophen-caffeine (FIORICET) 50-325-40 MG tablet, TAKE 1-2 TABLETS BY MOUTH EVERY 6 (SIX) HOURS AS NEEDED FOR HEADACHE., Disp: 20 tablet, Rfl: 0 .  HYDROcodone-acetaminophen (NORCO/VICODIN) 5-325 MG tablet, Take 1 tablet by mouth every 6 (six) hours as needed., Disp: 15 tablet, Rfl: 0 .  nitrofurantoin, macrocrystal-monohydrate, (MACROBID) 100 MG capsule, Take 1 capsule (100 mg total) by mouth 2 (two) times daily for 5 days. 1 po BId, Disp: 10 capsule, Rfl: 0 .  ondansetron (ZOFRAN ODT) 8 MG disintegrating tablet, Take 1 tablet (8 mg total) by mouth every 8 (eight) hours as needed for nausea or vomiting., Disp: 8 tablet, Rfl: 0 .  prenatal vitamin w/FE, FA (PRENATAL 1 + 1) 27-1 MG TABS tablet, Take 1 tablet by mouth daily at 12 noon., Disp: 30 tablet, Rfl: 6 .   SPRINTEC 28 0.25-35 MG-MCG tablet, Take 1 tablet by mouth daily., Disp: , Rfl:   Allergies  Allergen Reactions  . Ivp Dye [Iodinated Diagnostic Agents] Anaphylaxis  . Shellfish Allergy Anaphylaxis  . Aspirin Swelling and Other (See Comments)    Reaction:  Facial swelling    Past Medical History:  Diagnosis Date  . Anemia   . Chronic gastritis   . Esophageal stricture   . Fibroid 06/18/2017  . Frequent UTI   . GERD (gastroesophageal reflux disease)   . Headache   . Iron deficiency anemia 03/28/2016   due to menorrhagia  . Ovarian cyst     Observations/Objective: A&O  No respiratory distress or wheezing audible over the phone Mood, judgement, and thought processes all WNL   Assessment and Plan: 1. Suspected UTI - Education provided on UTIs. Encouraged adequate hydration.  - Urinalysis, Complete - Urine Culture - nitrofurantoin, macrocrystal-monohydrate, (MACROBID) 100 MG capsule; Take 1 capsule (100 mg total) by mouth 2 (two) times daily for 5 days. 1 po BId  Dispense: 10 capsule; Refill: 0   Follow Up Instructions:  I discussed the assessment and treatment plan with the patient. The patient was provided an opportunity to ask questions and all were answered. The patient agreed with the plan and demonstrated an understanding of the instructions.   The patient was advised to call back or seek an in-person evaluation if the symptoms worsen or if the condition fails to improve as anticipated.  The above assessment and management plan was discussed with the patient.  The patient verbalized understanding of and has agreed to the management plan. Patient is aware to call the clinic if symptoms persist or worsen. Patient is aware when to return to the clinic for a follow-up visit. Patient educated on when it is appropriate to go to the emergency department.   Time call ended: 9:01 AM  I provided 7 minutes of non-face-to-face time during this encounter.  Hendricks Limes, MSN, APRN,  FNP-C [facility] 04/14/20

## 2020-04-17 LAB — URINE CULTURE

## 2020-04-25 ENCOUNTER — Telehealth: Payer: Self-pay | Admitting: Family Medicine

## 2020-04-25 NOTE — Telephone Encounter (Signed)
Pt states she was bitten by an insect or spider about 5 days ago on her nose and it is swollen and would like an appt today or tomorrow. Advised pt we don't have any openings today or tomorrow. Offered virtual visit for Thursday but pt declined stating she would just go to UC.

## 2020-05-01 DIAGNOSIS — R067 Sneezing: Secondary | ICD-10-CM | POA: Diagnosis not present

## 2020-05-01 DIAGNOSIS — R0981 Nasal congestion: Secondary | ICD-10-CM | POA: Diagnosis not present

## 2020-05-01 DIAGNOSIS — R519 Headache, unspecified: Secondary | ICD-10-CM | POA: Diagnosis not present

## 2020-05-01 DIAGNOSIS — R05 Cough: Secondary | ICD-10-CM | POA: Diagnosis not present

## 2020-05-01 DIAGNOSIS — J029 Acute pharyngitis, unspecified: Secondary | ICD-10-CM | POA: Diagnosis not present

## 2020-05-02 ENCOUNTER — Other Ambulatory Visit: Payer: Self-pay | Admitting: Adult Health

## 2020-05-03 ENCOUNTER — Other Ambulatory Visit: Payer: Self-pay | Admitting: *Deleted

## 2020-05-03 DIAGNOSIS — F339 Major depressive disorder, recurrent, unspecified: Secondary | ICD-10-CM

## 2020-05-03 DIAGNOSIS — F411 Generalized anxiety disorder: Secondary | ICD-10-CM

## 2020-05-04 ENCOUNTER — Other Ambulatory Visit: Payer: Self-pay | Admitting: Adult Health

## 2020-05-10 ENCOUNTER — Encounter: Payer: Self-pay | Admitting: Family Medicine

## 2020-05-10 ENCOUNTER — Other Ambulatory Visit: Payer: Self-pay

## 2020-05-10 ENCOUNTER — Ambulatory Visit (INDEPENDENT_AMBULATORY_CARE_PROVIDER_SITE_OTHER): Payer: Medicaid Other | Admitting: Family Medicine

## 2020-05-10 VITALS — BP 123/84 | HR 90 | Temp 98.2°F | Resp 20 | Ht 62.0 in | Wt 141.1 lb

## 2020-05-10 DIAGNOSIS — R519 Headache, unspecified: Secondary | ICD-10-CM | POA: Diagnosis not present

## 2020-05-10 DIAGNOSIS — K581 Irritable bowel syndrome with constipation: Secondary | ICD-10-CM

## 2020-05-10 DIAGNOSIS — K5904 Chronic idiopathic constipation: Secondary | ICD-10-CM | POA: Diagnosis not present

## 2020-05-10 DIAGNOSIS — F339 Major depressive disorder, recurrent, unspecified: Secondary | ICD-10-CM

## 2020-05-10 DIAGNOSIS — Z13220 Encounter for screening for lipoid disorders: Secondary | ICD-10-CM

## 2020-05-10 DIAGNOSIS — G43019 Migraine without aura, intractable, without status migrainosus: Secondary | ICD-10-CM | POA: Diagnosis not present

## 2020-05-10 DIAGNOSIS — D509 Iron deficiency anemia, unspecified: Secondary | ICD-10-CM | POA: Diagnosis not present

## 2020-05-10 DIAGNOSIS — R7989 Other specified abnormal findings of blood chemistry: Secondary | ICD-10-CM | POA: Diagnosis not present

## 2020-05-10 DIAGNOSIS — G4701 Insomnia due to medical condition: Secondary | ICD-10-CM | POA: Diagnosis not present

## 2020-05-10 DIAGNOSIS — K2100 Gastro-esophageal reflux disease with esophagitis, without bleeding: Secondary | ICD-10-CM

## 2020-05-10 DIAGNOSIS — R140 Abdominal distension (gaseous): Secondary | ICD-10-CM | POA: Diagnosis not present

## 2020-05-10 MED ORDER — SUMATRIPTAN SUCCINATE 100 MG PO TABS: ORAL_TABLET | ORAL | 11 refills | Status: DC

## 2020-05-10 MED ORDER — TOPIRAMATE 25 MG PO TABS: ORAL_TABLET | ORAL | 0 refills | Status: DC

## 2020-05-10 MED ORDER — ESCITALOPRAM OXALATE 20 MG PO TABS: 40.0000 mg | ORAL_TABLET | Freq: Every day | ORAL | 1 refills | Status: DC

## 2020-05-10 MED ORDER — LINACLOTIDE 145 MCG PO CAPS
145.0000 ug | ORAL_CAPSULE | Freq: Every day | ORAL | 5 refills | Status: DC
Start: 2020-05-10 — End: 2021-03-06

## 2020-05-10 NOTE — Progress Notes (Signed)
 Subjective:  Patient ID: Laurie Howell, female    DOB: 05/13/1976  Age: 44 y.o. MRN: [id number]  CC: No chief complaint on file.   HPI Laurie Howell presents for recheck of her depression anxiety and insomnia.  Additionally she has problems with daily headaches.  [name] tells me that although she was told she had a thyroid condition Dr. Nida investigated and found that she had no thyroid disease.  Patient recently underwent a uterine ablation for dysfunctional uterine and resulting iron deficiency anemia.  Her abdominal pain has unfortunately continued and that she has a lot of of bloating and constipation.  She has reflux as well however this responds to occasional Tums.  She says that she has been having the problems with her abdomen ever since her tubal was reversed 5 years ago.  Patient has been having migraine headaches for about 4 years.  Recently she has been taking daily butalbital for relief.  The headaches go away but come back the same day or the next.  She describes the pain as a pounding bilateral frontal pain that causes occasional vomiting and photophobia.  It is moderately severe in intensity.  Patient does quite well with her Lexapro for depression she is taking high-dose 40 mg daily.  She cannot sleep if she misses a dose of her medicine.  She has been without it for couple weeks and she says she will go for days or so without sleeping at all then she will crash and sleep all day and all night then she will be a week again for another for 5 days.  Circumstantially regarding depression exacerbation she is going through a divorce currently and she states that a good friend of hers was recently killed by his girlfriend.  Depression screen PHQ 2/9 05/10/2020 03/28/2020 01/13/2020  Decreased Interest 0 0 0  Down, Depressed, Hopeless 0 0 0  PHQ - 2 Score 0 0 0  Altered sleeping - 0 -  Tired, decreased energy - 0 -  Change in appetite - 0 -  Feeling bad or failure about yourself  -  0 -  Trouble concentrating - 0 -  Moving slowly or fidgety/restless - 0 -  Suicidal thoughts - 0 -  PHQ-9 Score - 0 -  Difficult doing work/chores - - -  Some recent data might be hidden    History Chey has a past medical history of Anemia, Anovulation (01/23/2017), Chronic gastritis, DUB (dysfunctional uterine bleeding) (06/03/2017), Esophageal stricture, Fibroid (06/18/2017), Frequent UTI, GERD (gastroesophageal reflux disease), Headache, 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   She has a past surgical history that includes Tubal ligation (1997); Tubal reversal (02/2015); Esophagogastroduodenoscopy (N/A, 06/27/2016); Savory dilation (N/A, 06/27/2016); and Hysteroscopy (N/A, 03/22/2020).   Her family history includes Anxiety disorder in her father; Depression in her mother; Heart attack in her father; Heart disease in her father; Hypertension in her father and mother; Other in her mother; Stroke in her maternal grandmother.She reports that she quit smoking about 25 years ago. Her smoking use included cigarettes. She quit after 1.00 year of use. She has never used smokeless tobacco. She reports that she does not drink alcohol or use drugs.    ROS Review of Systems  Constitutional: Negative.   HENT: Negative for congestion.   Eyes: Negative for visual disturbance.  Respiratory:   Negative for shortness of breath.   Cardiovascular: Negative for chest pain.  Gastrointestinal: Positive for abdominal distention, abdominal pain and constipation. Negative for diarrhea, nausea and vomiting.  Genitourinary: Negative for difficulty urinating.  Musculoskeletal: Negative for arthralgias and myalgias.  Neurological: Positive for headaches.  Psychiatric/Behavioral:  Positive for dysphoric mood and sleep disturbance. The patient is nervous/anxious.     Objective:  BP 123/84   Pulse 90   Temp 98.2 F (36.8 C) (Temporal)   Resp 20   Ht 5' 2" (1.575 m)   Wt 141 lb 2 oz (64 kg)   SpO2 99%   BMI 25.81 kg/m   BP Readings from Last 3 Encounters:  05/10/20 123/84  03/28/20 121/80  03/22/20 138/86    Wt Readings from Last 3 Encounters:  05/10/20 141 lb 2 oz (64 kg)  03/28/20 137 lb (62.1 kg)  03/22/20 134 lb 0.6 oz (60.8 kg)     Physical Exam Constitutional:      Appearance: She is well-developed.  HENT:     Head: Normocephalic and atraumatic.  Cardiovascular:     Rate and Rhythm: Normal rate and regular rhythm.     Heart sounds: No murmur.  Pulmonary:     Effort: Pulmonary effort is normal.     Breath sounds: Normal breath sounds.  Abdominal:     General: Bowel sounds are normal. There is distension.     Palpations: Abdomen is soft. There is no mass.     Tenderness: There is abdominal tenderness (diffuse, mild). There is no guarding or rebound.  Skin:    General: Skin is warm and dry.  Neurological:     Mental Status: She is alert and oriented to person, place, and time.  Psychiatric:        Behavior: Behavior normal.       Assessment & Plan:   Diagnoses and all orders for this visit:  Intractable migraine without aura and without status migrainosus -     CBC with Differential/Platelet -     CMP14+EGFR  Iron deficiency anemia, unspecified iron deficiency anemia type -     CBC with Differential/Platelet -     CMP14+EGFR  Gastroesophageal reflux disease with esophagitis without hemorrhage -     CBC with Differential/Platelet -     CMP14+EGFR  Depression, recurrent (HCC) -     CBC with Differential/Platelet -     CMP14+EGFR  Abnormal thyroid blood test -     CBC with Differential/Platelet -     CMP14+EGFR -     TSH  Abdominal bloating -     CBC with Differential/Platelet -     CMP14+EGFR  Daily headache -      CBC with Differential/Platelet -     CMP14+EGFR  Chronic idiopathic constipation -     CBC with Differential/Platelet -     CMP14+EGFR  Insomnia due to medical condition -     CBC with Differential/Platelet -     CMP14+EGFR  Screening for cholesterol level -     Lipid panel  Irritable bowel syndrome with constipation  Other orders -     topiramate (TOPAMAX) 25 MG tablet; 1 nightly times 1 week.  Then 2 nightly for 1 week.  Then 3 nightly for 1 week.  Then 4 nightly. -     SUMAtriptan (IMITREX) 100 MG tablet; Take one at onset of HA. May repeat in 2 hours if headache persists or recurs. Limit two per 24 hours -       linaclotide (LINZESS) 145 MCG CAPS capsule; Take 1 capsule (145 mcg total) by mouth daily. To regulate bowel movements -     escitalopram (LEXAPRO) 20 MG tablet; Take 2 tablets (40 mg total) by mouth daily.       I have discontinued Chrislyn S. Asay's (prenatal vitamin w/FE, FA), ondansetron, HYDROcodone-acetaminophen, butalbital-acetaminophen-caffeine, and Sprintec 28. I have also changed her escitalopram. Additionally, I am having her start on topiramate, SUMAtriptan, and linaclotide.  Allergies as of 05/10/2020      Reactions   Ivp Dye [iodinated Diagnostic Agents] Anaphylaxis   Shellfish Allergy Anaphylaxis   Aspirin Swelling, Other (See Comments)   Reaction:  Facial swelling       Medication List       Accurate as of May 10, 2020  3:04 PM. If you have any questions, ask your nurse or doctor.        STOP taking these medications   butalbital-acetaminophen-caffeine 50-325-40 MG tablet Commonly known as: FIORICET Stopped by:  , MD   HYDROcodone-acetaminophen 5-325 MG tablet Commonly known as: NORCO/VICODIN Stopped by:  , MD   ondansetron 8 MG disintegrating tablet Commonly known as: Zofran ODT Stopped by:  , MD   prenatal vitamin w/FE, FA 27-1 MG Tabs tablet Stopped by:  , MD   Sprintec 28 0.25-35  MG-MCG tablet Generic drug: norgestimate-ethinyl estradiol Stopped by:  , MD     TAKE these medications   escitalopram 20 MG tablet Commonly known as: LEXAPRO Take 2 tablets (40 mg total) by mouth daily.   linaclotide 145 MCG Caps capsule Commonly known as: Linzess Take 1 capsule (145 mcg total) by mouth daily. To regulate bowel movements Started by:  , MD   SUMAtriptan 100 MG tablet Commonly known as: Imitrex Take one at onset of HA. May repeat in 2 hours if headache persists or recurs. Limit two per 24 hours Started by:  , MD   topiramate 25 MG tablet Commonly known as: TOPAMAX 1 nightly times 1 week.  Then 2 nightly for 1 week.  Then 3 nightly for 1 week.  Then 4 nightly. Started by:  , MD        Follow-up: Return in about 1 month (around 06/10/2020) for headache.   , M.D. 

## 2020-05-11 LAB — CBC WITH DIFFERENTIAL/PLATELET
Basophils Absolute: 0.1 10*3/uL (ref 0.0–0.2)
Basos: 0 %
EOS (ABSOLUTE): 0 10*3/uL (ref 0.0–0.4)
Eos: 0 %
Hematocrit: 40 % (ref 34.0–46.6)
Hemoglobin: 13 g/dL (ref 11.1–15.9)
Immature Grans (Abs): 0.1 10*3/uL (ref 0.0–0.1)
Immature Granulocytes: 1 %
Lymphocytes Absolute: 1.8 10*3/uL (ref 0.7–3.1)
Lymphs: 13 %
MCH: 29.5 pg (ref 26.6–33.0)
MCHC: 32.5 g/dL (ref 31.5–35.7)
MCV: 91 fL (ref 79–97)
Monocytes Absolute: 0.6 10*3/uL (ref 0.1–0.9)
Monocytes: 4 %
Neutrophils Absolute: 11.8 10*3/uL — ABNORMAL HIGH (ref 1.4–7.0)
Neutrophils: 82 %
Platelets: 394 10*3/uL (ref 150–450)
RBC: 4.4 x10E6/uL (ref 3.77–5.28)
RDW: 14.3 % (ref 11.7–15.4)
WBC: 14.3 10*3/uL — ABNORMAL HIGH (ref 3.4–10.8)

## 2020-05-11 LAB — LIPID PANEL
Chol/HDL Ratio: 3.5 ratio (ref 0.0–4.4)
Cholesterol, Total: 182 mg/dL (ref 100–199)
HDL: 52 mg/dL (ref >39)
LDL Chol Calc (NIH): 89 mg/dL (ref 0–99)
Triglycerides: 244 mg/dL — ABNORMAL HIGH (ref 0–149)
VLDL Cholesterol Cal: 41 mg/dL — ABNORMAL HIGH (ref 5–40)

## 2020-05-11 LAB — CMP14+EGFR
ALT: 21 IU/L (ref 0–32)
AST: 18 IU/L (ref 0–40)
Albumin/Globulin Ratio: 1.4 (ref 1.2–2.2)
Albumin: 4.2 g/dL (ref 3.8–4.8)
Alkaline Phosphatase: 82 IU/L (ref 39–117)
BUN/Creatinine Ratio: 16 (ref 9–23)
BUN: 10 mg/dL (ref 6–24)
Bilirubin Total: 0.3 mg/dL (ref 0.0–1.2)
CO2: 25 mmol/L (ref 20–29)
Calcium: 10 mg/dL (ref 8.7–10.2)
Chloride: 101 mmol/L (ref 96–106)
Creatinine, Ser: 0.64 mg/dL (ref 0.57–1.00)
GFR calc Af Amer: 126 mL/min/{1.73_m2} (ref >59)
GFR calc non Af Amer: 110 mL/min/{1.73_m2} (ref >59)
Globulin, Total: 2.9 g/dL (ref 1.5–4.5)
Glucose: 108 mg/dL — ABNORMAL HIGH (ref 65–99)
Potassium: 4.5 mmol/L (ref 3.5–5.2)
Sodium: 139 mmol/L (ref 134–144)
Total Protein: 7.1 g/dL (ref 6.0–8.5)

## 2020-05-11 LAB — TSH: TSH: 0.43 u[IU]/mL — ABNORMAL LOW (ref 0.450–4.500)

## 2020-06-06 ENCOUNTER — Telehealth: Payer: Self-pay | Admitting: Family Medicine

## 2020-06-06 NOTE — Telephone Encounter (Signed)
PAtient needs appt for UTI

## 2020-06-07 ENCOUNTER — Encounter: Payer: Self-pay | Admitting: Family Medicine

## 2020-06-07 ENCOUNTER — Telehealth (INDEPENDENT_AMBULATORY_CARE_PROVIDER_SITE_OTHER): Payer: Medicaid Other | Admitting: Family Medicine

## 2020-06-07 DIAGNOSIS — R300 Dysuria: Secondary | ICD-10-CM | POA: Diagnosis not present

## 2020-06-07 DIAGNOSIS — N3090 Cystitis, unspecified without hematuria: Secondary | ICD-10-CM

## 2020-06-07 LAB — URINALYSIS, COMPLETE
Bilirubin, UA: NEGATIVE
Glucose, UA: NEGATIVE
Nitrite, UA: POSITIVE — AB
Protein,UA: NEGATIVE
Specific Gravity, UA: 1.025 (ref 1.005–1.030)
Urobilinogen, Ur: 0.2 mg/dL (ref 0.2–1.0)
pH, UA: 5 (ref 5.0–7.5)

## 2020-06-07 LAB — MICROSCOPIC EXAMINATION
Epithelial Cells (non renal): >10 /hpf — AB (ref 0–10)
Renal Epithel, UA: NONE SEEN /hpf

## 2020-06-07 MED ORDER — AMOXICILLIN 500 MG PO CAPS: 500.0000 mg | ORAL_CAPSULE | Freq: Three times a day (TID) | ORAL | 0 refills | Status: DC

## 2020-06-07 NOTE — Progress Notes (Signed)
Subjective:    Patient ID: Laurie Howell, female    DOB: 1976/07/03, 44 y.o.   MRN: [id number]   HPI: Laurie Howell is a 44 y.o. female presenting for burning with urination and frequency for several days. Denies fever . Has flank pain. No nausea, vomiting.    Depression screen [facility] 2/9 05/10/2020 03/28/2020 01/13/2020 09/27/2019 08/13/2019  Decreased Interest 0 0 0 0 0  Down, Depressed, Hopeless 0 0 0 0 0  PHQ - 2 Score 0 0 0 0 0  Altered sleeping - 0 - - -  Tired, decreased energy - 0 - - -  Change in appetite - 0 - - -  Feeling bad or failure about yourself  - 0 - - -  Trouble concentrating - 0 - - -  Moving slowly or fidgety/restless - 0 - - -  Suicidal thoughts - 0 - - -  PHQ-9 Score - 0 - - -  Difficult doing work/chores - - - - -  Some recent data might be hidden     Relevant past medical, surgical, family and social history reviewed and updated as indicated.  Interim medical history since our last visit reviewed. Allergies and medications reviewed and updated.  ROS:  Review of Systems  Constitutional: Negative for chills, diaphoresis and fever.  HENT: Negative for congestion.   Eyes: Negative for visual disturbance.  Respiratory: Negative for cough and shortness of breath.   Cardiovascular: Negative for chest pain and palpitations.  Gastrointestinal: Negative for constipation, diarrhea and nausea.  Genitourinary: Positive for dysuria, frequency and urgency. Negative for decreased urine volume, flank pain, hematuria, menstrual problem and pelvic pain.  Musculoskeletal: Negative for arthralgias and joint swelling.  Skin: Negative for rash.  Neurological: Negative for dizziness and numbness.     Social History   Tobacco Use  Smoking Status Former Smoker  . Years: 1.00  . Types: Cigarettes  . Quit date: 12/30/1994  . Years since quitting: 25.4  Smokeless Tobacco Never Used  Tobacco Comment   2-3 daily       Objective:     Wt Readings from Last 3  Encounters:  05/10/20 141 lb 2 oz (64 kg)  03/28/20 137 lb (62.1 kg)  03/22/20 134 lb 0.6 oz (60.8 kg)     Exam deferred. Pt. Harboring due to COVID 19. Phone visit performed.   Assessment & Plan:   1. Cystitis   2. Dysuria     Meds ordered this encounter  Medications  . amoxicillin (AMOXIL) 500 MG capsule    Sig: Take 1 capsule (500 mg total) by mouth 3 (three) times daily.    Dispense:  21 capsule    Refill:  0    Orders Placed This Encounter  Procedures  . Urine Culture  . Microscopic Examination  . Urinalysis, Complete      Diagnoses and all orders for this visit:  Cystitis -     Urinalysis, Complete -     Urine Culture -     Microscopic Examination -     amoxicillin (AMOXIL) 500 MG capsule; Take 1 capsule (500 mg total) by mouth 3 (three) times daily.  Dysuria -     Urinalysis, Complete -     Urine Culture -     Microscopic Examination -     amoxicillin (AMOXIL) 500 MG capsule; Take 1 capsule (500 mg total) by mouth 3 (three) times daily.    Virtual Visit via telephone Note  I discussed  the limitations, risks, security and privacy concerns of performing an evaluation and management service by telephone and the availability of in person appointments. The patient was identified with two identifiers. Pt.expressed understanding and agreed to proceed. Pt. Is at home. Dr. Livia Snellen is in his office.  Follow Up Instructions:   I discussed the assessment and treatment plan with the patient. The patient was provided an opportunity to ask questions and all were answered. The patient agreed with the plan and demonstrated an understanding of the instructions.   The patient was advised to call back or seek an in-person evaluation if the symptoms worsen or if the condition fails to improve as anticipated.   Total minutes including chart review and phone contact time: 7   Follow up plan: Return if symptoms worsen or fail to improve.  Claretta Fraise, MD [place]

## 2020-06-09 LAB — URINE CULTURE

## 2020-06-13 ENCOUNTER — Ambulatory Visit: Payer: Medicaid Other | Admitting: Family Medicine

## 2020-06-13 ENCOUNTER — Other Ambulatory Visit: Payer: Self-pay

## 2020-06-13 ENCOUNTER — Encounter: Payer: Self-pay | Admitting: Family Medicine

## 2020-06-13 VITALS — BP 92/66 | HR 83 | Temp 98.0°F | Ht 62.0 in | Wt 132.6 lb

## 2020-06-13 DIAGNOSIS — G43019 Migraine without aura, intractable, without status migrainosus: Secondary | ICD-10-CM | POA: Diagnosis not present

## 2020-06-13 MED ORDER — TOPIRAMATE 200 MG PO TABS
200.0000 mg | ORAL_TABLET | Freq: Every day | ORAL | 1 refills | Status: DC
Start: 2020-06-13 — End: 2020-07-19

## 2020-06-13 NOTE — Progress Notes (Signed)
Subjective:  Patient ID: Laurie Howell, female    DOB: 07/28/1976  Age: 44 y.o. MRN: [id number]  CC: Follow-up (1 month)   HPI Laurie Howell presents for significant improvement in her headache syndrome.  It is not yet resolved.  She is tolerating the medicine well.  She follow the titration of the topiramate as directed and is now taking 100 mg at bedtime  Depression screen [facility] 2/9 06/13/2020 05/10/2020 03/28/2020  Decreased Interest 0 0 0  Down, Depressed, Hopeless 0 0 0  PHQ - 2 Score 0 0 0  Altered sleeping - - 0  Tired, decreased energy - - 0  Change in appetite - - 0  Feeling bad or failure about yourself  - - 0  Trouble concentrating - - 0  Moving slowly or fidgety/restless - - 0  Suicidal thoughts - - 0  PHQ-9 Score - - 0  Difficult doing work/chores - - -  Some recent data might be hidden    History Laurie Howell has a past medical history of Anemia, Anovulation (01/23/2017), Chronic gastritis, DUB (dysfunctional uterine bleeding) (06/03/2017), Esophageal stricture, Fibroid (06/18/2017), Frequent UTI, GERD (gastroesophageal reflux disease), Headache, 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   She has a past surgical history that includes Tubal ligation (1997); Tubal reversal (02/2015); Esophagogastroduodenoscopy (N/A, 06/27/2016); Savory dilation (N/A, 06/27/2016); and Hysteroscopy (N/A, 03/22/2020).   Her family history includes Anxiety disorder in her father; Depression in her mother; Heart attack in her father; Heart disease in her father; Hypertension in her father and mother; Other in her mother; Stroke in her maternal grandmother.She reports that she quit smoking about 25 years ago. Her smoking use included cigarettes. She quit after 1.00 year of  use. She has never used smokeless tobacco. She reports that she does not drink alcohol and does not use drugs.    ROS Review of Systems  Constitutional: Negative.   HENT: Negative.   Eyes: Negative for visual disturbance.  Respiratory: Negative for shortness of breath.   Cardiovascular: Negative for chest pain.  Gastrointestinal: Negative for abdominal pain.  Musculoskeletal: Negative for arthralgias.    Objective:  BP 92/66   Pulse 83   Temp 98 F (36.7 C) (Temporal)   Ht 5\' 2"  (1.575 m)   Wt 132 lb 9.6 oz (60.1 kg)   BMI 24.25 kg/m   BP Readings from Last 3 Encounters:  06/13/20 92/66  05/10/20 123/84  03/28/20 121/80    Wt Readings from Last 3 Encounters:  06/13/20 132 lb 9.6 oz (60.1 kg)  05/10/20 141 lb 2 oz (64 kg)  03/28/20 137 lb (62.1 kg)     Physical Exam Constitutional:      General: She is not in acute distress.    Appearance: She is well-developed.  Cardiovascular:     Rate and Rhythm: Normal rate and regular rhythm.  Pulmonary:     Breath sounds: Normal breath sounds.  Skin:    General: Skin is warm and dry.  Neurological:     Mental Status: She is alert and oriented to person, place, and time.       Assessment & Plan:   Laurie Howell was seen today for follow-up.  Diagnoses and all orders for this visit:  Intractable migraine without aura and without status migrainosus  Other orders -     topiramate (  TOPAMAX) 200 MG tablet; Take 1 tablet (200 mg total) by mouth at bedtime.       I have changed Laurie Howell topiramate. I am also having her maintain her SUMAtriptan, linaclotide, escitalopram, and amoxicillin.  Allergies as of 06/13/2020      Reactions   Ivp Dye [iodinated Diagnostic Agents] Anaphylaxis   Shellfish Allergy Anaphylaxis   Aspirin Swelling, Other (See Comments)   Reaction:  Facial swelling       Medication List       Accurate as of June 13, 2020 10:03 PM. If you have any questions, ask your nurse or doctor.         amoxicillin 500 MG capsule Commonly known as: AMOXIL Take 1 capsule (500 mg total) by mouth 3 (three) times daily.   escitalopram 20 MG tablet Commonly known as: LEXAPRO Take 2 tablets (40 mg total) by mouth daily.   linaclotide 145 MCG Caps capsule Commonly known as: Linzess Take 1 capsule (145 mcg total) by mouth daily. To regulate bowel movements   SUMAtriptan 100 MG tablet Commonly known as: Imitrex Take one at onset of HA. May repeat in 2 hours if headache persists or recurs. Limit two per 24 hours   topiramate 200 MG tablet Commonly known as: TOPAMAX Take 1 tablet (200 mg total) by mouth at bedtime. What changed:   medication strength  how much to take  how to take this  when to take this  additional instructions Changed by: Claretta Fraise, MD        Follow-up: Return in about 1 month (around 07/13/2020).  Claretta Fraise, M.D.

## 2020-06-21 ENCOUNTER — Ambulatory Visit: Payer: Medicaid Other | Admitting: Physician Assistant

## 2020-06-21 ENCOUNTER — Other Ambulatory Visit: Payer: Self-pay

## 2020-06-21 ENCOUNTER — Encounter: Payer: Self-pay | Admitting: Physician Assistant

## 2020-06-21 VITALS — BP 103/65 | HR 91 | Temp 98.0°F | Ht 62.0 in | Wt 133.6 lb

## 2020-06-21 DIAGNOSIS — N898 Other specified noninflammatory disorders of vagina: Secondary | ICD-10-CM

## 2020-06-21 DIAGNOSIS — B9689 Other specified bacterial agents as the cause of diseases classified elsewhere: Secondary | ICD-10-CM | POA: Diagnosis not present

## 2020-06-21 DIAGNOSIS — B373 Candidiasis of vulva and vagina: Secondary | ICD-10-CM | POA: Diagnosis not present

## 2020-06-21 DIAGNOSIS — B3731 Acute candidiasis of vulva and vagina: Secondary | ICD-10-CM

## 2020-06-21 DIAGNOSIS — N760 Acute vaginitis: Secondary | ICD-10-CM

## 2020-06-21 LAB — URINALYSIS, COMPLETE
Bilirubin, UA: NEGATIVE
Glucose, UA: NEGATIVE
Nitrite, UA: NEGATIVE
Protein,UA: NEGATIVE
Specific Gravity, UA: 1.02 (ref 1.005–1.030)
Urobilinogen, Ur: 1 mg/dL (ref 0.2–1.0)
pH, UA: 6.5 (ref 5.0–7.5)

## 2020-06-21 LAB — MICROSCOPIC EXAMINATION
Epithelial Cells (non renal): >10 /hpf — AB (ref 0–10)
Renal Epithel, UA: NONE SEEN /hpf

## 2020-06-21 LAB — WET PREP FOR TRICH, YEAST, CLUE
Clue Cell Exam: POSITIVE — AB
Trichomonas Exam: NEGATIVE
Yeast Exam: POSITIVE — AB

## 2020-06-21 MED ORDER — METRONIDAZOLE 500 MG PO TABS
500.0000 mg | ORAL_TABLET | Freq: Two times a day (BID) | ORAL | 0 refills | Status: DC
Start: 2020-06-21 — End: 2020-07-19

## 2020-06-21 MED ORDER — FLUCONAZOLE 150 MG PO TABS: 150.0000 mg | ORAL_TABLET | Freq: Once | ORAL | 0 refills | Status: AC

## 2020-06-21 NOTE — Progress Notes (Signed)
  Subjective:     Patient ID: Laurie Howell, female   DOB: 08/23/1976, 44 y.o.   MRN: [id number]  HPI Pt here due to lower abd pain and vaginal discharge Pt currently having UIC with 1 partner Sx worse after IC Recent ablation due to sig uterine bleeding    Review of Systems  Constitutional: Negative.   Gastrointestinal: Negative.   Genitourinary: Positive for dyspareunia, dysuria, flank pain, frequency, pelvic pain and vaginal discharge. Negative for decreased urine volume, vaginal bleeding and vaginal pain.       Objective:   Physical Exam Vitals and nursing note reviewed.  Constitutional:      General: She is not in acute distress.    Appearance: Normal appearance. She is not toxic-appearing.  Abdominal:     General: Abdomen is flat. There is no distension.     Palpations: Abdomen is soft. There is no mass.     Tenderness: There is abdominal tenderness. There is no right CVA tenderness, left CVA tenderness, guarding or rebound.     Hernia: No hernia is present.     Comments: Suprapubic TTP  Neurological:     Mental Status: She is alert.   Wet prep/UA per chart     Assessment:     1. Vaginal discharge   2. BV (bacterial vaginosis)   3. Yeast vaginitis        Plan:     Flagyl 500mg  bid x 1 week Pt to avoid ETOH use while on med Diflucan 150mg  today and at the end of ATB tx Hydrate well Discussed ca ox crystals in urine and risk for stones INB discussed possible STD testing given UIC

## 2020-06-21 NOTE — Patient Instructions (Signed)

## 2020-07-13 ENCOUNTER — Other Ambulatory Visit: Payer: Self-pay

## 2020-07-13 ENCOUNTER — Encounter: Payer: Self-pay | Admitting: Family Medicine

## 2020-07-13 ENCOUNTER — Ambulatory Visit: Payer: Medicaid Other | Admitting: Family Medicine

## 2020-07-13 VITALS — BP 116/80 | HR 79 | Temp 98.2°F | Resp 20 | Ht 62.0 in | Wt 133.2 lb

## 2020-07-13 DIAGNOSIS — G43709 Chronic migraine without aura, not intractable, without status migrainosus: Secondary | ICD-10-CM

## 2020-07-13 NOTE — Progress Notes (Signed)
Subjective:  Patient ID: Laurie Howell, female    DOB: 09-11-1976  Age: 44 y.o. MRN: [id number]  CC: 1 month follow up   HPI Laurie Howell presents for follow-up on her headache prevention treatment she states that she has had 3-4 headaches that were less than 5/10 except that she has a little bit worse when today that is 6/10.  The headaches are typically dull and throbbing.  They do not affect her vision.  She is tolerating the topiramate medication without side effects  Depression screen [facility] 2/9 07/13/2020 06/21/2020 06/13/2020  Decreased Interest 0 0 0  Down, Depressed, Hopeless 0 0 0  PHQ - 2 Score 0 0 0  Altered sleeping - - -  Tired, decreased energy - - -  Change in appetite - - -  Feeling bad or failure about yourself  - - -  Trouble concentrating - - -  Moving slowly or fidgety/restless - - -  Suicidal thoughts - - -  PHQ-9 Score - - -  Difficult doing work/chores - - -  Some recent data might be hidden    History Laurie Howell has a past medical history of Anemia, Anovulation (01/23/2017), Chronic gastritis, DUB (dysfunctional uterine bleeding) (06/03/2017), Esophageal stricture, Fibroid (06/18/2017), Frequent UTI, GERD (gastroesophageal reflux disease), Headache, 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   She has a past surgical history that includes Tubal ligation (1997); Tubal reversal (02/2015); Esophagogastroduodenoscopy (N/A, 06/27/2016); Savory dilation (N/A, 06/27/2016); and Hysteroscopy (N/A, 03/22/2020).   Her family history includes Anxiety disorder in her father; Depression in her mother; Heart attack in her father; Heart disease in her father; Hypertension in her father and mother; Other in her mother; Stroke in her maternal grandmother.She  reports that she quit smoking about 25 years ago. Her smoking use included cigarettes. She quit after 1.00 year of use. She has never used smokeless tobacco. She reports that she does not drink alcohol and does not use drugs.    ROS Review of Systems  Constitutional: Negative.   HENT: Negative.   Eyes: Negative for visual disturbance.  Respiratory: Negative for shortness of breath.   Cardiovascular: Negative for chest pain.  Gastrointestinal: Negative for abdominal pain.  Musculoskeletal: Negative for arthralgias.  Neurological: Positive for headaches.    Objective:  BP 116/80   Pulse 79   Temp 98.2 F (36.8 C) (Temporal)   Resp 20   Ht 5\' 2"  (1.575 m)   Wt 133 lb 4 oz (60.4 kg)   SpO2 96%   BMI 24.37 kg/m   BP Readings from Last 3 Encounters:  07/13/20 116/80  06/21/20 103/65  06/13/20 92/66    Wt Readings from Last 3 Encounters:  07/13/20 133 lb 4 oz (60.4 kg)  06/21/20 133 lb 9.6 oz (60.6 kg)  06/13/20 132 lb 9.6 oz (60.1 kg)     Physical Exam Constitutional:      General: She is not in acute distress.    Appearance: She is well-developed.  Cardiovascular:     Rate and Rhythm: Normal rate and regular rhythm.  Pulmonary:     Breath sounds: Normal breath sounds.  Skin:    General: Skin is warm and dry.  Neurological:     Mental Status: She is alert and oriented to person, place, and time. Mental status is at baseline.  Cranial Nerves: No cranial nerve deficit.     Motor: No weakness.     Coordination: Coordination normal.     Deep Tendon Reflexes: Reflexes normal.       Assessment & Plan:   Laurie Howell was seen today for 1 month follow up.  Diagnoses and all orders for this visit:  Chronic migraine without aura without status migrainosus, not intractable       I am having Laurie Howell maintain her SUMAtriptan, linaclotide, escitalopram, topiramate, and metroNIDAZOLE.  Allergies as of 07/13/2020      Reactions   Ivp Dye [iodinated  Diagnostic Agents] Anaphylaxis   Shellfish Allergy Anaphylaxis   Aspirin Swelling, Other (See Comments)   Reaction:  Facial swelling       Medication List       Accurate as of July 13, 2020 11:59 PM. If you have any questions, ask your nurse or doctor.        escitalopram 20 MG tablet Commonly known as: LEXAPRO Take 2 tablets (40 mg total) by mouth daily.   linaclotide 145 MCG Caps capsule Commonly known as: Linzess Take 1 capsule (145 mcg total) by mouth daily. To regulate bowel movements   metroNIDAZOLE 500 MG tablet Commonly known as: Flagyl Take 1 tablet (500 mg total) by mouth 2 (two) times daily.   SUMAtriptan 100 MG tablet Commonly known as: Imitrex Take one at onset of HA. May repeat in 2 hours if headache persists or recurs. Limit two per 24 hours   topiramate 200 MG tablet Commonly known as: TOPAMAX Take 1 tablet (200 mg total) by mouth at bedtime.        Follow-up: Return in about 6 months (around 01/13/2021).  Laurie Howell, M.D.

## 2020-07-17 ENCOUNTER — Encounter: Payer: Self-pay | Admitting: Family Medicine

## 2020-07-19 ENCOUNTER — Ambulatory Visit (INDEPENDENT_AMBULATORY_CARE_PROVIDER_SITE_OTHER): Payer: Medicaid Other | Admitting: Adult Health

## 2020-07-19 ENCOUNTER — Encounter: Payer: Self-pay | Admitting: Adult Health

## 2020-07-19 VITALS — BP 122/79 | HR 79 | Ht 62.0 in | Wt 133.0 lb

## 2020-07-19 DIAGNOSIS — Z87440 Personal history of urinary (tract) infections: Secondary | ICD-10-CM

## 2020-07-19 NOTE — Progress Notes (Signed)
  Subjective:     Patient ID: Laurie Howell, female   DOB: [birth year], 44 y.o.   MRN: [id number]  HPI Laurie Howell is a 44 year old white female,separated, [id number] in complaining of recurrent UTIs and yeast infections since ablation.Was treated at PCP. PCP is [place].    Review of Systems Has had recurrent UTI and yeast since ablation, none today No bleeding since ablation  Reviewed past medical,surgical, social and family history. Reviewed medications and allergies.     Objective:   Physical Exam BP 122/79 (BP Location: Left Arm, Patient Position: Sitting, Cuff Size: Normal)   Pulse 79   Ht 5\' 2"  (1.575 m)   Wt 133 lb (60.3 kg)   BMI 24.33 kg/m  Skin warm and dry. Cardiovascular: regular rate and rhythm.     Assessment:     1. History of UTI Has had recurrent UTI and Yeast since abaltion    Plan:     Pee before and after sex Use a condom Call me with next occurrence of yeast or UTI symptoms to be seen

## 2020-07-27 ENCOUNTER — Telehealth: Payer: Self-pay | Admitting: "Endocrinology

## 2020-07-27 NOTE — Telephone Encounter (Signed)
Can we use just the TSH for tomorrow's appt or do you want her to r/s since she did not do the T4?

## 2020-07-27 NOTE — Telephone Encounter (Signed)
She also needed AM cortisol, so it will not be a complete visit with the labs we ordered. Reschedule.

## 2020-07-28 ENCOUNTER — Ambulatory Visit: Payer: Medicaid Other | Admitting: "Endocrinology

## 2020-11-11 ENCOUNTER — Other Ambulatory Visit: Payer: Self-pay | Admitting: Adult Health

## 2020-11-13 ENCOUNTER — Other Ambulatory Visit: Payer: Self-pay | Admitting: Family Medicine

## 2020-11-13 NOTE — Telephone Encounter (Signed)
Pt called stating that she has been out of her Lexapro Rx for a few days and needs refill. Dr Livia Snellen doesn't have any openings until 12/12/20.  Please advise.

## 2020-11-14 ENCOUNTER — Telehealth: Payer: Self-pay

## 2020-11-28 ENCOUNTER — Emergency Department (HOSPITAL_COMMUNITY)
Admission: EM | Admit: 2020-11-28 | Discharge: 2020-11-28 | Disposition: A | Discharge status: Home / Self Care | Payer: Medicaid Other | Attending: Emergency Medicine | Admitting: Emergency Medicine

## 2020-11-28 ENCOUNTER — Encounter (HOSPITAL_COMMUNITY): Payer: Self-pay | Admitting: Emergency Medicine

## 2020-11-28 ENCOUNTER — Other Ambulatory Visit: Payer: Self-pay

## 2020-11-28 ENCOUNTER — Emergency Department (HOSPITAL_COMMUNITY): Payer: Medicaid Other

## 2020-11-28 DIAGNOSIS — R55 Syncope and collapse: Secondary | ICD-10-CM | POA: Insufficient documentation

## 2020-11-28 DIAGNOSIS — M25562 Pain in left knee: Secondary | ICD-10-CM | POA: Diagnosis not present

## 2020-11-28 DIAGNOSIS — M79672 Pain in left foot: Secondary | ICD-10-CM | POA: Diagnosis not present

## 2020-11-28 DIAGNOSIS — Z87891 Personal history of nicotine dependence: Secondary | ICD-10-CM | POA: Insufficient documentation

## 2020-11-28 DIAGNOSIS — M25561 Pain in right knee: Secondary | ICD-10-CM | POA: Insufficient documentation

## 2020-11-28 DIAGNOSIS — Z79899 Other long term (current) drug therapy: Secondary | ICD-10-CM | POA: Diagnosis not present

## 2020-11-28 DIAGNOSIS — M79675 Pain in left toe(s): Secondary | ICD-10-CM | POA: Diagnosis not present

## 2020-11-28 LAB — HEPATIC FUNCTION PANEL
ALT: 14 U/L (ref 0–44)
AST: 18 U/L (ref 15–41)
Albumin: 3.8 g/dL (ref 3.5–5.0)
Alkaline Phosphatase: 54 U/L (ref 38–126)
Bilirubin, Direct: 0.1 mg/dL (ref 0.0–0.2)
Indirect Bilirubin: 0.3 mg/dL (ref 0.3–0.9)
Total Bilirubin: 0.4 mg/dL (ref 0.3–1.2)
Total Protein: 6.9 g/dL (ref 6.5–8.1)

## 2020-11-28 LAB — URINALYSIS, ROUTINE W REFLEX MICROSCOPIC
Bacteria, UA: NONE SEEN
Bilirubin Urine: NEGATIVE
Glucose, UA: NEGATIVE mg/dL
Ketones, ur: NEGATIVE mg/dL
Leukocytes,Ua: NEGATIVE
Nitrite: NEGATIVE
Protein, ur: NEGATIVE mg/dL
Specific Gravity, Urine: 1.019 (ref 1.005–1.030)
pH: 6 (ref 5.0–8.0)

## 2020-11-28 LAB — CBC
HCT: 36.8 % (ref 36.0–46.0)
Hemoglobin: 12 g/dL (ref 12.0–15.0)
MCH: 32.2 pg (ref 26.0–34.0)
MCHC: 32.6 g/dL (ref 30.0–36.0)
MCV: 98.7 fL (ref 80.0–100.0)
Platelets: 244 10*3/uL (ref 150–400)
RBC: 3.73 MIL/uL — ABNORMAL LOW (ref 3.87–5.11)
RDW: 12.5 % (ref 11.5–15.5)
WBC: 7.7 10*3/uL (ref 4.0–10.5)
nRBC: 0 % (ref 0.0–0.2)

## 2020-11-28 NOTE — Discharge Instructions (Signed)
Like we discussed, if you develop any new or worsening symptoms you need to return to the emergency department for immediate reevaluation.  I have given you a referral below to Dr. Domenic Polite who is a cardiologist in the area.  Please give Dr. Domenic Polite a call and schedule a follow-up appointment.

## 2020-11-28 NOTE — ED Triage Notes (Signed)
Pt states she lost consciousness in her kitchen and fell injuring her left knee, shin and toes.  Minor bruising noted to shin. Pt denies hitting her head or taking blood thinners.

## 2020-11-28 NOTE — ED Provider Notes (Signed)
[facility] EMERGENCY DEPARTMENT Provider Note   CSN: [id number] Arrival date & time: 11/28/20  1934     History Chief Complaint  Patient presents with  . Loss of Consciousness    Laurie Howell is a 44 y.o. female.  HPI   Patient is a 44 year old female who presents the emergency department due to a syncopal episode.  Patient notes a history of syncope in the past and states that this typically occurs every 6-9 months.  She reports a history of dysfunctional uterine bleeding.  She had an endometrial ablation performed earlier this year and denies any recurrent vaginal bleeding.  In the past she felt her syncope was 2/2 to anemia. Patient states she was in her kitchen just prior to arrival and syncopized and fell to the floor.  She is unsure of the details regarding this.  She woke up on her back and reports pain to the right anterior knee as well as the third and fourth toes of the left foot.  Small amount of bruising.  Pain worsens with movement. Patient denies any sx preceding her syncopal event. In the past she will sometimes become dizzy/lightheaded prior to syncopizing.  No numbness, tingling, weakness, chest pain, shortness of breath, abdominal pain, urinary changes.  Patient denies being on any hormone-based medications.  No recent travel, immobilization, history of blood clots. Denies any drug or ETOH use.      Past Medical History:  Diagnosis Date  . Anemia   . Anovulation 01/23/2017  . Chronic gastritis   . DUB (dysfunctional uterine bleeding) 06/03/2017  . Esophageal stricture   . Fibroid 06/18/2017  . Frequent UTI   . GERD (gastroesophageal reflux disease)   . Headache   . 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 Intractable chronic cluster headache 03/02/2020  . Iron deficiency anemia 03/28/2016   due to menorrhagia  . LLQ pain 06/03/2017  . Menorrhagia with irregular cycle 01/13/2020  . Menorrhagia with  regular cycle 06/03/2017  . Ovarian cyst   . Pelvic pain 01/13/2020    Patient Active Problem List   Diagnosis Date Noted  . History of UTI 07/19/2020  . Irritable bowel syndrome with constipation 05/10/2020  . GAD (generalized anxiety disorder) 08/13/2019  . Chronic idiopathic constipation 08/13/2019  . Insomnia due to medical condition 08/13/2019  . Abnormal thyroid blood test 07/22/2019  . Nerve pain 08/12/2017  . Abdominal bloating 06/03/2017  . Depression, recurrent ([place]) 06/03/2017  . Gastroesophageal reflux disease with esophagitis   . Iron deficiency anemia 03/28/2016  . Intractable migraine without aura 01/14/2014  . Daily headache 01/14/2014    Past Surgical History:  Procedure Laterality Date  . ESOPHAGOGASTRODUODENOSCOPY N/A 06/27/2016   Procedure: ESOPHAGOGASTRODUODENOSCOPY (EGD);  Surgeon: Danie Binder, MD;  Location[facility];  Service: Endoscopy;  Laterality: N/A;  1245  . HYSTEROSCOPY N/A 03/22/2020   Procedure: HYSTEROSCOPY WITH MINERVA ENDOMETRIAL ABLATION;  Surgeon: Florian Buff, MD;  Location: [facility] ORS;  Service: Gynecology;  Laterality: N/A;  . SAVORY DILATION N/A 06/27/2016   Procedure: SAVORY DILATION;  Surgeon: Danie Binder, MD;  Location: [facility];  Service: Endoscopy;  Laterality: N/A;  . TUBAL LIGATION  1997  . Tubal reversal  02/2015   [facility] - Dr Karie Kirks     OB History    Gravida  6   Para  2   Term  2   Preterm  AB  4   Living  2     SAB  4   TAB      Ectopic      Multiple      Live Births  2           Family History  Problem Relation Age of Onset  . Depression Mother   . Hypertension Mother   . Other Mother        open heart surgery  . Heart disease Father   . Hypertension Father   . Anxiety disorder Father   . Heart attack Father   . Stroke Maternal Grandmother   . Colon cancer Neg Hx     Social History   Tobacco Use  . Smoking status: Former Smoker    Years: 1.00    Types: Cigarettes     Quit date: 12/30/1994    Years since quitting: 25.9  . Smokeless tobacco: Never Used  . Tobacco comment: 2-3 daily  Vaping Use  . Vaping Use: Never used  Substance Use Topics  . Alcohol use: No  . Drug use: No    Home Medications Prior to Admission medications   Medication Sig Start Date End Date Taking? Authorizing Provider  escitalopram (LEXAPRO) 20 MG tablet TAKE 2 TABLETS BY MOUTH EVERY DAY 11/13/20   Claretta Fraise, MD  linaclotide [facility]) 145 MCG CAPS capsule Take 1 capsule (145 mcg total) by mouth daily. To regulate bowel movements 05/10/20   Claretta Fraise, MD    Allergies    Ivp dye [iodinated diagnostic agents], Shellfish allergy, and Aspirin  Review of Systems   Review of Systems  All other systems reviewed and are negative. Ten systems reviewed and are negative for acute change, except as noted in the HPI.   Physical Exam Updated Vital Signs BP 127/89 (BP Location: Right Arm)   Pulse 70   Temp 97.8 F (36.6 C) (Oral)   Resp 16   Ht 5\' 2"  (1.575 m)   Wt 63.5 kg   SpO2 98%   BMI 25.61 kg/m   Physical Exam Vitals and nursing note reviewed.  Constitutional:      General: She is not in acute distress.    Appearance: Normal appearance. She is normal weight. She is not ill-appearing, toxic-appearing or diaphoretic.  HENT:     Head: Normocephalic and atraumatic.     Comments: No visible signs of trauma.    Right Ear: External ear normal.     Left Ear: External ear normal.     Nose: Nose normal.     Mouth/Throat:     Mouth: Mucous membranes are moist.     Pharynx: Oropharynx is clear. No oropharyngeal exudate or posterior oropharyngeal erythema.  Eyes:     General: No scleral icterus.       Right eye: No discharge.        Left eye: No discharge.     Extraocular Movements: Extraocular movements intact.     Conjunctiva/sclera: Conjunctivae normal.     Pupils: Pupils are equal, round, and reactive to light.     Comments: Pupils are equal, round, reactive to  light.  Extraocular movements intact.  Neck:     Comments: No midline C, T, L-spine tenderness. Cardiovascular:     Rate and Rhythm: Normal rate and regular rhythm.     Pulses: Normal pulses.     Heart sounds: Normal heart sounds. No murmur heard.  No friction rub. No gallop.  Comments: RRR.  No murmurs, rubs, or gallops. Pulmonary:     Effort: Pulmonary effort is normal. No respiratory distress.     Breath sounds: Normal breath sounds. No stridor. No wheezing, rhonchi or rales.     Comments: Lungs are clear to auscultation bilaterally. Abdominal:     General: Abdomen is flat.     Palpations: Abdomen is soft.     Tenderness: There is no abdominal tenderness.  Musculoskeletal:        General: Normal range of motion.     Cervical back: Normal range of motion and neck supple. No tenderness.     Comments: Moderate TTP noted along the left patella.  Full active and passive range of motion of the left knee.  Additional mild TTP appreciated along the left third and fourth toes.  Distal sensation intact.  Palpable pedal pulses.  Skin:    General: Skin is warm and dry.  Neurological:     General: No focal deficit present.     Mental Status: She is alert and oriented to person, place, and time.     Comments: Patient is speaking clearly, coherently, and in complete sentences.  Moving all 4 extremities with ease.  Distal sensation intact.  No gross deficits.  Psychiatric:        Mood and Affect: Mood normal.        Behavior: Behavior normal.    ED Results / Procedures / Treatments   Labs (all labs ordered are listed, but only abnormal results are displayed) Labs Reviewed  CBC - Abnormal; Notable for the following components:      Result Value   RBC 3.73 (*)    All other components within normal limits  URINALYSIS, ROUTINE W REFLEX MICROSCOPIC - Abnormal; Notable for the following components:   APPearance HAZY (*)    Hgb urine dipstick SMALL (*)    All other components within normal  limits  BASIC METABOLIC PANEL  HEPATIC FUNCTION PANEL  I-STAT BETA HCG BLOOD, ED ([facility], [facility], [facility] ONLY)   EKG EKG Interpretation  Date/Time:  Tuesday November 28 2020 19:47:56 EST Ventricular Rate:  65 PR Interval:  122 QRS Duration: 96 QT Interval:  418 QTC Calculation: 434 R Axis:   69 Text Interpretation: Normal sinus rhythm Normal ECG Confirmed by Gerlene Fee [phone]) on 11/28/2020 9:48:09 PM  Radiology CT Head Wo Contrast  Result Date: 11/28/2020 CLINICAL DATA:  Syncope, simple, normal neuro exam. EXAM: CT HEAD WITHOUT CONTRAST TECHNIQUE: Contiguous axial images were obtained from the base of the skull through the vertex without intravenous contrast. COMPARISON:  Head CT 12/09/2013. FINDINGS: Brain: Cerebral volume is normal. There is no acute intracranial hemorrhage. No demarcated cortical infarct. No extra-axial fluid collection. No evidence of intracranial mass. No midline shift. Vascular: No hyperdense vessel. Skull: Normal. Negative for fracture or focal suspicious osseous lesion. Sinuses/Orbits: Visualized orbits show no acute finding. No significant paranasal sinus disease at the imaged levels IMPRESSION: Unremarkable non-contrast CT appearance of the brain. No evidence of acute intracranial abnormality. Electronically Signed   By: Kellie Simmering DO   On: 11/28/2020 21:04   DG Knee Complete 4 Views Left  Result Date: 11/28/2020 CLINICAL DATA:  Fall today.  Left knee pain. EXAM: LEFT KNEE - COMPLETE 4+ VIEW COMPARISON:  None. FINDINGS: No evidence of fracture, dislocation, or joint effusion. No evidence of arthropathy or other focal bone abnormality. Soft tissues are unremarkable. IMPRESSION: Negative. Electronically Signed   By: Marlaine Hind M.D.   On:  11/28/2020 21:01   DG Foot Complete Left  Result Date: 11/28/2020 CLINICAL DATA:  Fall today. Left foot and toe pain. Initial encounter. EXAM: LEFT FOOT - COMPLETE 3+ VIEW COMPARISON:  None. FINDINGS: There is no evidence of  fracture or dislocation. There is no evidence of arthropathy or other focal bone abnormality. Soft tissues are unremarkable. IMPRESSION: Negative. Electronically Signed   By: Marlaine Hind M.D.   On: 11/28/2020 21:02    Procedures Procedures (including critical care time)  Medications Ordered in ED Medications - No data to display  ED Course  I have reviewed the triage vital signs and the nursing notes.  Pertinent labs & imaging results that were available during my care of the patient were reviewed by me and considered in my medical decision making (see chart for details).  Clinical Course as of Nov 28 2316  Tue Nov 28, 2020  2121 Unremarkable head CT.  No evidence of acute intracranial abnormality.  CT Head Wo Contrast [LJ]  2122 Negative.  DG Foot Complete Left [LJ]  2122 Negative.  DG Knee Complete 4 Views Left [LJ]    Clinical Course User Index [LJ] Rayna Sexton, PA-C   MDM Rules/Calculators/A&P                          Patient is a 44 year old female that presents today due to a syncopal event that occurred prior to arrival.  Patient was standing in her kitchen and without warning lost consciousness.  She then woke on the floor.  She had moderate pain to the left knee as well as digits of the toes of the left feet.  I obtained a CT scan of the head, left knee, and left foot.  These images were personally reviewed and interpreted.  These were all reassuring.  Patient does have a history of anemia.  Fortunately today her hemoglobin is within normal limits at 12.0.  UA is benign.  Hepatic function panel and BMP are also reassuring.  Lab was unable to obtain a chloride but otherwise her electrolytes were all within normal limits.  Hepatic function panel is benign.  Normal kidney function. ECG with NSR.   Patient was discussed with and evaluated by my attending physician Dr. Gerlene Fee.  Her neurological exam today was benign.  We both feel that she is stable for discharge at  this time.  Patient states she is feeling much better and is agreeable.  Patient had no further syncopal events while in the emergency department.  Given her recurrent symptoms we will give her a referral to cardiology.  Feel that she could likely benefit from a trial with a Holter monitor.  This was discussed with the patient and she is amenable.  She was given strict return precautions and knows to return to the ER with any new or worsening symptoms.  Her questions were answered and she was amicable at the time of discharge.  Her vital signs are stable.  Final Clinical Impression(s) / ED Diagnoses Final diagnoses:  Syncope and collapse   Rx / DC Orders ED Discharge Orders    None       Rayna Sexton, PA-C 11/28/20 2318    Maudie Flakes, MD 11/28/20 2348

## 2020-11-29 ENCOUNTER — Telehealth: Payer: Self-pay

## 2020-11-29 LAB — BASIC METABOLIC PANEL
Anion gap: 7 (ref 5–15)
BUN: 11 mg/dL (ref 6–20)
CO2: 24 mmol/L (ref 22–32)
Calcium: 9.5 mg/dL (ref 8.9–10.3)
Chloride: 106 mmol/L (ref 98–111)
Creatinine, Ser: 0.65 mg/dL (ref 0.44–1.00)
GFR, Estimated: >60 mL/min (ref >60)
Glucose, Bld: 98 mg/dL (ref 70–99)
Potassium: 3.8 mmol/L (ref 3.5–5.1)
Sodium: 137 mmol/L (ref 135–145)

## 2020-11-29 NOTE — Progress Notes (Signed)
Cardiology Office Note   Date:  12/01/2020   ID:  Laurie Howell, DOB 04-May-1976, MRN [id number]  PCP:  Claretta Fraise, MD  Cardiologist:   Minus Breeding, MD Referring:  Claretta Fraise, MD  No chief complaint on file.     History of Present Illness: Laurie Howell is a 44 y.o. female who is referred by Claretta Fraise, MD for evaluation of syncope.  She was in the ED two days ago for this.  I reviewed these records for this visit.   She had a head CT which demonstrated no acute abnormalities.  She did injure her left knee and foot.    She says she has had a history of this.  She is past currently seeing her 9 times and says she is doing about once a month.  She does describe orthostatic symptoms.  However, her syncope happens when she is sitting or standing.  She might feel hot and dizzy.  Sometimes she can sit down and it might pass but at other times she has no warning and she drops to the floor.  She wakes up and she is on the floor and has noted the last time with the leg injury.  She does not think there is any triggers.  She is not having any palpitations.  She is not having any chest pressure, neck or arm discomfort.  Sometimes she feels like she has to sign but she does not have any shortness of breath, PND or orthopnea.  She has never had any prior cardiac work-up.  Her EKG in the emergency room was unremarkable.  Past Medical History:  Diagnosis Date  . Anemia   . Anovulation 01/23/2017  . Chronic gastritis   . DUB (dysfunctional uterine bleeding) 06/03/2017  . Esophageal stricture   . Fibroid 06/18/2017  . Frequent UTI   . GERD (gastroesophageal reflux disease)   . Headache   . 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 Intractable chronic cluster headache 03/02/2020  . Iron deficiency anemia 03/28/2016   due to menorrhagia  . LLQ pain 06/03/2017  . Menorrhagia with irregular cycle 01/13/2020  . Menorrhagia with  regular cycle 06/03/2017  . Ovarian cyst   . Pelvic pain 01/13/2020    Past Surgical History:  Procedure Laterality Date  . ESOPHAGOGASTRODUODENOSCOPY N/A 06/27/2016   Procedure: ESOPHAGOGASTRODUODENOSCOPY (EGD);  Surgeon: Danie Binder, MD;  [facility];  Service: Endoscopy;  Laterality: N/A;  1245  . HYSTEROSCOPY N/A 03/22/2020   Procedure: HYSTEROSCOPY WITH MINERVA ENDOMETRIAL ABLATION;  Surgeon: Florian Buff, MD;  Location: [facility] ORS;  Service: Gynecology;  Laterality: N/A;  . SAVORY DILATION N/A 06/27/2016   Procedure: SAVORY DILATION;  Surgeon: Danie Binder, MD;  Location: [facility];  Service: Endoscopy;  Laterality: N/A;  . TUBAL LIGATION  1997  . Tubal reversal  02/2015   [facility] - Dr Karie Kirks     Current Outpatient Medications  Medication Sig Dispense Refill  . acetaminophen (TYLENOL) 500 MG tablet Take 500 mg by mouth every 6 (six) hours as needed.    Marland Kitchen escitalopram (LEXAPRO) 20 MG tablet TAKE 2 TABLETS BY MOUTH EVERY DAY 60 tablet 1  . fluconazole (DIFLUCAN) 150 MG tablet Take 150 mg by mouth once.     . linaclotide (LINZESS) 145 MCG CAPS capsule Take 1 capsule (145 mcg total) by mouth daily. To regulate bowel movements 30 capsule 5  .  Prenatal Vit-Fe Fumarate-FA (M-NATAL PLUS) 27-1 MG TABS Take 1 tablet by mouth at bedtime.     No current facility-administered medications for this visit.    Allergies:   Ivp dye [iodinated diagnostic agents], Shellfish allergy, and Aspirin    Social History:  The patient  reports that she quit smoking about 25 years ago. Her smoking use included cigarettes. She quit after 1.00 year of use. She has never used smokeless tobacco. She reports that she does not drink alcohol and does not use drugs.   Family History:  The patient's family history includes Anxiety disorder in her father; Depression in her mother; Heart attack (age of onset: 29) in her father; Hypertension in her father and mother; Other (age of onset: 23) in her mother;  Peripheral vascular disease (age of onset: 45) in her father; Stroke in her maternal grandmother.    ROS:  Please see the history of present illness.   Otherwise, review of systems are positive for none.   All other systems are reviewed and negative.    PHYSICAL EXAM: VS:  BP 113/77 (BP Location: Right Arm, Cuff Size: Normal)   Pulse 85   Ht 5\' 2"  (1.575 m)   Wt 129 lb 9.6 oz (58.8 kg)   SpO2 99%   BMI 23.70 kg/m  , BMI Body mass index is 23.7 kg/m. GENERAL:  Well appearing HEENT:  Pupils equal round and reactive, fundi not visualized, oral mucosa unremarkable NECK:  No jugular venous distention, waveform within normal limits, carotid upstroke brisk and symmetric, no bruits, no thyromegaly LYMPHATICS:  No cervical, inguinal adenopathy LUNGS:  Clear to auscultation bilaterally BACK:  No CVA tenderness CHEST:  Unremarkable HEART:  PMI not displaced or sustained,S1 and S2 within normal limits, no S3, no S4, no clicks, no rubs, no murmurs ABD:  Flat, positive bowel sounds normal in frequency in pitch, no bruits, no rebound, no guarding, no midline pulsatile mass, no hepatomegaly, no splenomegaly EXT:  2 plus pulses throughout, no edema, no cyanosis no clubbing SKIN:  No rashes no nodules NEURO:  Cranial nerves II through XII grossly intact, motor grossly intact throughout PSYCH:  Cognitively intact, oriented to person place and time    EKG:  EKG is not ordered today. The ekg ordered 11/28/2020 demonstrates sinus rhythm, rate 65, axis within normal limits, intervals within normal limits, no acute ST-T wave changes.   Recent Labs: 05/10/2020: TSH 0.430 11/28/2020: ALT 14; BUN 11; Creatinine, Ser 0.65; Hemoglobin 12.0; Platelets 244; Potassium 3.8; Sodium 137    Lipid Panel    Component Value Date/Time   CHOL 182 05/10/2020 1435   TRIG 244 (H) 05/10/2020 1435   HDL 52 05/10/2020 1435   CHOLHDL 3.5 05/10/2020 1435   LDLCALC 89 05/10/2020 1435      Wt Readings from Last 3  Encounters:  12/01/20 129 lb 9.6 oz (58.8 kg)  11/28/20 140 lb (63.5 kg)  07/19/20 133 lb (60.3 kg)      Other studies Reviewed: Additional studies/ records that were reviewed today include: ED records. Review of the above records demonstrates:  Please see elsewhere in the note.     ASSESSMENT AND PLAN:  SYNCOPE:    The patient has sudden syncope and the most recent was with trauma.  This certainly could be arrhythmogenic.  She is not driving and understands she cannot.  (Her son drives her everywhere.).  Of note she was not orthostatic in the office.  There is no family history to suggest  an etiology.  Her EKG was unremarkable.  I will start with an echocardiogram and a 4-week event monitor.  She might need a loop monitor.   Current medicines are reviewed at length with the patient today.  The patient does not have concerns regarding medicines.  The following changes have been made:  no change  Labs/ tests ordered today include:   Orders Placed This Encounter  Procedures  . CARDIAC EVENT MONITOR  . ECHOCARDIOGRAM COMPLETE     Disposition:   FU with me six weeks.      Signed, Minus Breeding, MD  12/01/2020 4:25 PM    [facility]

## 2020-11-29 NOTE — Telephone Encounter (Signed)
Transition Care Management Unsuccessful Follow-up Telephone Call  Date of discharge and from where:  11/28/2020 [facility]  Attempts:  1st Attempt  Reason for unsuccessful TCM follow-up call:  Voice mail full

## 2020-11-30 NOTE — Telephone Encounter (Signed)
Patient has Cardiology appointment 12/01/2020 4PM with Minus Breeding, MD. Closing encounter.

## 2020-12-01 ENCOUNTER — Encounter: Payer: Self-pay | Admitting: Cardiology

## 2020-12-01 ENCOUNTER — Ambulatory Visit (INDEPENDENT_AMBULATORY_CARE_PROVIDER_SITE_OTHER): Payer: Medicaid Other | Admitting: Cardiology

## 2020-12-01 VITALS — BP 113/77 | HR 85 | Ht 62.0 in | Wt 129.6 lb

## 2020-12-01 DIAGNOSIS — R55 Syncope and collapse: Secondary | ICD-10-CM | POA: Diagnosis not present

## 2020-12-01 NOTE — Patient Instructions (Signed)
Medication Instructions:   Your physician recommends that you continue on your current medications as directed. Please refer to the Current Medication list given to you today.  Labwork:  None  Testing/Procedures: Your physician has requested that you have an echocardiogram. Echocardiography is a painless test that uses sound waves to create images of your heart. It provides your doctor with information about the size and shape of your heart and how well your hearts chambers and valves are working. This procedure takes approximately one hour. There are no restrictions for this procedure. Your physician has recommended that you wear an event monitor for 30 days. Event monitors are medical devices that record the hearts electrical activity. Doctors most often [place] these monitors to diagnose arrhythmias. Arrhythmias are problems with the speed or rhythm of the heartbeat. The monitor is a small, portable device. You can wear one while you do your normal daily activities. This is usually used to diagnose what is causing palpitations/syncope (passing out). [facility] will contact you about this monitor.  Follow-Up:  Your physician recommends that you schedule a follow-up appointment in: pending.   Any Other Special Instructions Will Be Listed Below (If Applicable).  If you need a refill on your cardiac medications before your next appointment, please call your pharmacy.

## 2020-12-04 ENCOUNTER — Telehealth: Payer: Self-pay | Admitting: Cardiology

## 2020-12-04 NOTE — Telephone Encounter (Signed)
Pre-cert Verification for the following procedure    ECHO   DATE:01/05/2020  LOCATION:[facility]

## 2020-12-12 ENCOUNTER — Telehealth: Payer: Self-pay | Admitting: Cardiology

## 2020-12-12 NOTE — Telephone Encounter (Signed)
  PERCERT VERIFICATION   Patient's Echo has been re-scheduled for 12/18/2020 at [facility]... Checking percert.  The 01/04/2021 has been cancelled.

## 2020-12-12 NOTE — Telephone Encounter (Signed)
Called patient in regards to upcoming echo scheduled for 01/04/2021 at [facility]. Need to try and re-schedule sooner due to her diagnosis.  Unable to reach patient. Message left asking for a return call.

## 2020-12-17 DIAGNOSIS — R55 Syncope and collapse: Secondary | ICD-10-CM | POA: Diagnosis not present

## 2020-12-18 ENCOUNTER — Other Ambulatory Visit: Payer: Self-pay

## 2020-12-18 ENCOUNTER — Ambulatory Visit (INDEPENDENT_AMBULATORY_CARE_PROVIDER_SITE_OTHER): Payer: Medicaid Other

## 2020-12-18 ENCOUNTER — Telehealth: Payer: Self-pay | Admitting: Cardiology

## 2020-12-18 ENCOUNTER — Ambulatory Visit (HOSPITAL_COMMUNITY)
Admission: RE | Admit: 2020-12-18 | Discharge: 2020-12-18 | Disposition: A | Discharge status: Home / Self Care | Payer: Medicaid Other | Source: Ambulatory Visit | Attending: Cardiology | Admitting: Cardiology

## 2020-12-18 DIAGNOSIS — R55 Syncope and collapse: Secondary | ICD-10-CM

## 2020-12-18 LAB — ECHOCARDIOGRAM COMPLETE
Area-P 1/2: 2.96 cm2
S' Lateral: 2.8 cm

## 2020-12-18 NOTE — Telephone Encounter (Signed)
Pre-cert Verification for the following procedure    30 Day Event Monitor/Hochrein

## 2020-12-18 NOTE — Progress Notes (Signed)
*  PRELIMINARY RESULTS* Echocardiogram 2D Echocardiogram has been performed.  Laurie Howell 12/18/2020, 1:37 PM

## 2021-01-03 ENCOUNTER — Other Ambulatory Visit: Payer: Medicaid Other

## 2021-01-04 ENCOUNTER — Other Ambulatory Visit (HOSPITAL_COMMUNITY): Payer: Medicaid Other

## 2021-01-11 ENCOUNTER — Ambulatory Visit: Payer: Medicaid Other | Admitting: Family Medicine

## 2021-01-13 ENCOUNTER — Other Ambulatory Visit: Payer: Self-pay | Admitting: Family Medicine

## 2021-01-13 ENCOUNTER — Other Ambulatory Visit: Payer: Self-pay | Admitting: Adult Health

## 2021-01-30 ENCOUNTER — Ambulatory Visit: Payer: Medicaid Other | Admitting: Family Medicine

## 2021-02-15 ENCOUNTER — Ambulatory Visit: Payer: Medicaid Other | Admitting: Family Medicine

## 2021-02-16 ENCOUNTER — Other Ambulatory Visit: Payer: Self-pay | Admitting: Adult Health

## 2021-02-16 ENCOUNTER — Other Ambulatory Visit: Payer: Self-pay | Admitting: Family Medicine

## 2021-03-06 ENCOUNTER — Other Ambulatory Visit: Payer: Self-pay

## 2021-03-06 ENCOUNTER — Encounter: Payer: Self-pay | Admitting: Family Medicine

## 2021-03-06 ENCOUNTER — Ambulatory Visit: Payer: Medicaid Other | Admitting: Family Medicine

## 2021-03-06 VITALS — BP 107/70 | HR 81 | Temp 98.3°F | Ht 62.0 in | Wt 130.2 lb

## 2021-03-06 DIAGNOSIS — F411 Generalized anxiety disorder: Secondary | ICD-10-CM | POA: Diagnosis not present

## 2021-03-06 DIAGNOSIS — R5382 Chronic fatigue, unspecified: Secondary | ICD-10-CM

## 2021-03-06 DIAGNOSIS — G4701 Insomnia due to medical condition: Secondary | ICD-10-CM | POA: Diagnosis not present

## 2021-03-06 DIAGNOSIS — R300 Dysuria: Secondary | ICD-10-CM | POA: Diagnosis not present

## 2021-03-06 LAB — MICROSCOPIC EXAMINATION

## 2021-03-06 LAB — URINALYSIS, COMPLETE
Bilirubin, UA: NEGATIVE
Glucose, UA: NEGATIVE
Ketones, UA: NEGATIVE
Leukocytes,UA: NEGATIVE
Nitrite, UA: NEGATIVE
Protein,UA: NEGATIVE
Specific Gravity, UA: 1.015 (ref 1.005–1.030)
Urobilinogen, Ur: 0.2 mg/dL (ref 0.2–1.0)
pH, UA: 6.5 (ref 5.0–7.5)

## 2021-03-06 MED ORDER — MAGNESIUM 400 MG PO TABS: 400.0000 mg | ORAL_TABLET | Freq: Every evening | ORAL | 3 refills | Status: DC

## 2021-03-06 MED ORDER — DULOXETINE HCL 60 MG PO CPEP: 60.0000 mg | ORAL_CAPSULE | Freq: Every day | ORAL | 2 refills | Status: DC

## 2021-03-06 NOTE — Progress Notes (Signed)
Subjective:  Patient ID: Laurie Howell, female    DOB: January 20, 1976  Age: 45 y.o. MRN: [id number]  CC: Medical Management of Chronic Issues   HPI DAMON BAISCH presents for tired all the time. Can't sleep. Can stay up five days at a time. Onset when her dad passed away 25 years ago.  Had cardiac w/u 4 mos ago after passing out. Tests ruled out cardiac origin of fatigue.   Pr. Feels that the lexapro isn't working anymore for anxiety. Gets nervous, worries more, On edge.  Depression screen [facility] 2/9 03/06/2021 07/13/2020 06/21/2020  Decreased Interest 0 0 0  Down, Depressed, Hopeless 0 0 0  PHQ - 2 Score 0 0 0  Altered sleeping - - -  Tired, decreased energy - - -  Change in appetite - - -  Feeling bad or failure about yourself  - - -  Trouble concentrating - - -  Moving slowly or fidgety/restless - - -  Suicidal thoughts - - -  PHQ-9 Score - - -  Difficult doing work/chores - - -  Some recent data might be hidden   .[email or web address] History Maddix has a past medical history of Anemia, Anovulation (01/23/2017), Chronic gastritis, DUB (dysfunctional uterine bleeding) (06/03/2017), Esophageal stricture, Fibroid (06/18/2017), Frequent UTI, GERD (gastroesophageal reflux disease), Headache, 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   She has a past surgical history that includes Tubal ligation (1997); Tubal reversal (02/2015); Esophagogastroduodenoscopy (N/A, 06/27/2016); Savory dilation (N/A, 06/27/2016); and Hysteroscopy (N/A, 03/22/2020).   Her family history includes Anxiety disorder in her father; Depression in her mother; Heart attack (age of onset: 97) in her father; Hypertension in her father and mother; Other (age of onset: 64) in her mother; Peripheral vascular disease  (age of onset: 27) in her father; Stroke in her maternal grandmother.She reports that she quit smoking about 26 years ago. Her smoking use included cigarettes. She quit after 1.00 year of use. She has never used smokeless tobacco. She reports that she does not drink alcohol and does not use drugs.    ROS Review of Systems  Constitutional: Negative.   HENT: Negative.   Eyes: Negative for visual disturbance.  Respiratory: Negative for shortness of breath.   Cardiovascular: Negative for chest pain.  Gastrointestinal: Negative for abdominal pain.  Musculoskeletal: Negative for arthralgias.    Objective:  BP 107/70   Pulse 81   Temp 98.3 F (36.8 C)   Ht '5\' 2"'  (1.575 m)   Wt 130 lb 3.2 oz (59.1 kg)   SpO2 99%   BMI 23.81 kg/m   BP Readings from Last 3 Encounters:  03/06/21 107/70  12/01/20 113/77  11/28/20 129/68    Wt Readings from Last 3 Encounters:  03/06/21 130 lb 3.2 oz (59.1 kg)  12/01/20 129 lb 9.6 oz (58.8 kg)  11/28/20 140 lb (63.5 kg)     Physical Exam Constitutional:      General: She is not in acute distress.    Appearance: She is well-developed.  HENT:     Head: Normocephalic and atraumatic.  Eyes:     Conjunctiva/sclera: Conjunctivae normal.     Pupils: Pupils are equal, round, and reactive to light.  Neck:     Thyroid: No thyromegaly.  Cardiovascular:     Rate and Rhythm: Normal rate and regular rhythm.  Heart sounds: Normal heart sounds. No murmur heard.   Pulmonary:     Effort: Pulmonary effort is normal.     Breath sounds: Normal breath sounds.  Abdominal:     Tenderness: There is no abdominal tenderness.  Musculoskeletal:        General: Normal range of motion.     Cervical back: Normal range of motion and neck supple.  Lymphadenopathy:     Cervical: No cervical adenopathy.  Skin:    General: Skin is warm and dry.  Neurological:     Mental Status: She is alert and oriented to person, place, and time.  Psychiatric:        Behavior:  Behavior normal.        Thought Content: Thought content normal.        Judgment: Judgment normal.       Assessment & Plan:   Merlin was seen today for medical management of chronic issues.  Diagnoses and all orders for this visit:  GAD (generalized anxiety disorder) -     DULoxetine (CYMBALTA) 60 MG capsule; Take 1 capsule (60 mg total) by mouth daily.  Dysuria -     Urinalysis, Complete -     Urine Culture  Insomnia due to medical condition -     CBC with Differential/Platelet -     CMP14+EGFR -     TSH + free T4  Chronic fatigue -     CBC with Differential/Platelet -     CMP14+EGFR -     TSH + free T4  Other orders -     Magnesium 400 MG TABS; Take 400 mg by mouth every evening. -     Microscopic Examination       I have discontinued Vita S. Mozer's linaclotide, fluconazole, M-Natal Plus, escitalopram, and megestrol. I am also having her start on DULoxetine and Magnesium. Additionally, I am having her maintain her acetaminophen.  Allergies as of 03/06/2021      Reactions   Ivp Dye [iodinated Diagnostic Agents] Anaphylaxis   Shellfish Allergy Anaphylaxis   Aspirin Swelling, Other (See Comments)   Reaction:  Facial swelling       Medication List       Accurate as of March 06, 2021  9:37 PM. If you have any questions, ask your nurse or doctor.        STOP taking these medications   escitalopram 20 MG tablet Commonly known as: LEXAPRO Stopped by: Claretta Fraise, MD   fluconazole 150 MG tablet Commonly known as: DIFLUCAN Stopped by: Claretta Fraise, MD   linaclotide 145 MCG Caps capsule Commonly known as: Linzess Stopped by: Claretta Fraise, MD   M-Natal Plus 27-1 MG Tabs Stopped by: Claretta Fraise, MD   megestrol 40 MG tablet Commonly known as: MEGACE Stopped by: Claretta Fraise, MD     TAKE these medications   acetaminophen 500 MG tablet Commonly known as: TYLENOL Take 500 mg by mouth every 6 (six) hours as needed.   DULoxetine 60 MG  capsule Commonly known as: Cymbalta Take 1 capsule (60 mg total) by mouth daily. Started by: Claretta Fraise, MD   Magnesium 400 MG Tabs Take 400 mg by mouth every evening. Started by: Claretta Fraise, MD        Follow-up: Return in about 2 weeks (around 03/20/2021).  Claretta Fraise, M.D.

## 2021-03-07 LAB — CBC WITH DIFFERENTIAL/PLATELET
Basophils Absolute: 0.1 10*3/uL (ref 0.0–0.2)
Basos: 1 %
EOS (ABSOLUTE): 0.1 10*3/uL (ref 0.0–0.4)
Eos: 1 %
Hematocrit: 39.5 % (ref 34.0–46.6)
Hemoglobin: 13.2 g/dL (ref 11.1–15.9)
Immature Grans (Abs): 0 10*3/uL (ref 0.0–0.1)
Immature Granulocytes: 0 %
Lymphocytes Absolute: 1.7 10*3/uL (ref 0.7–3.1)
Lymphs: 27 %
MCH: 31.9 pg (ref 26.6–33.0)
MCHC: 33.4 g/dL (ref 31.5–35.7)
MCV: 95 fL (ref 79–97)
Monocytes Absolute: 0.5 10*3/uL (ref 0.1–0.9)
Monocytes: 7 %
Neutrophils Absolute: 3.9 10*3/uL (ref 1.4–7.0)
Neutrophils: 64 %
Platelets: 316 10*3/uL (ref 150–450)
RBC: 4.14 x10E6/uL (ref 3.77–5.28)
RDW: 12.7 % (ref 11.7–15.4)
WBC: 6.3 10*3/uL (ref 3.4–10.8)

## 2021-03-07 LAB — CMP14+EGFR
ALT: 19 IU/L (ref 0–32)
AST: 28 IU/L (ref 0–40)
Albumin/Globulin Ratio: 1.6 (ref 1.2–2.2)
Albumin: 4.4 g/dL (ref 3.8–4.8)
Alkaline Phosphatase: 80 IU/L (ref 44–121)
BUN/Creatinine Ratio: 9 (ref 9–23)
BUN: 7 mg/dL (ref 6–24)
Bilirubin Total: 0.2 mg/dL (ref 0.0–1.2)
CO2: 22 mmol/L (ref 20–29)
Calcium: 9.5 mg/dL (ref 8.7–10.2)
Chloride: 102 mmol/L (ref 96–106)
Creatinine, Ser: 0.76 mg/dL (ref 0.57–1.00)
Globulin, Total: 2.7 g/dL (ref 1.5–4.5)
Glucose: 100 mg/dL — ABNORMAL HIGH (ref 65–99)
Potassium: 4.3 mmol/L (ref 3.5–5.2)
Sodium: 138 mmol/L (ref 134–144)
Total Protein: 7.1 g/dL (ref 6.0–8.5)
eGFR: 99 mL/min/{1.73_m2} (ref >59)

## 2021-03-07 LAB — TSH+FREE T4
Free T4: 0.99 ng/dL (ref 0.82–1.77)
TSH: 0.209 u[IU]/mL — ABNORMAL LOW (ref 0.450–4.500)

## 2021-03-08 LAB — URINE CULTURE

## 2021-03-10 NOTE — Progress Notes (Signed)
Hello Mellany,  Your lab result is normal and/or stable.Some minor variations that are not significant are commonly marked abnormal, but do not represent any medical problem for you.  Best regards, Claretta Fraise, M.D.

## 2021-03-22 ENCOUNTER — Ambulatory Visit: Payer: Medicaid Other | Admitting: Family Medicine

## 2021-03-28 ENCOUNTER — Ambulatory Visit: Payer: Medicaid Other | Admitting: Family Medicine

## 2021-04-02 ENCOUNTER — Encounter: Payer: Self-pay | Admitting: Family Medicine

## 2021-04-02 ENCOUNTER — Ambulatory Visit (INDEPENDENT_AMBULATORY_CARE_PROVIDER_SITE_OTHER): Payer: Medicaid Other | Admitting: Family Medicine

## 2021-04-02 DIAGNOSIS — J0100 Acute maxillary sinusitis, unspecified: Secondary | ICD-10-CM | POA: Diagnosis not present

## 2021-04-02 MED ORDER — AMOXICILLIN-POT CLAVULANATE 875-125 MG PO TABS: 1.0000 | ORAL_TABLET | Freq: Two times a day (BID) | ORAL | 0 refills | Status: DC

## 2021-04-02 MED ORDER — PSEUDOEPHEDRINE-GUAIFENESIN ER 120-1200 MG PO TB12: 1.0000 | ORAL_TABLET | Freq: Two times a day (BID) | ORAL | 1 refills | Status: DC

## 2021-04-02 NOTE — Progress Notes (Signed)
Subjective:    Patient ID: Laurie Howell, female    DOB: 12-Sep-1976, 45 y.o.   MRN: [id number]   HPI: Laurie Howell is a 45 y.o. female presenting for cough, hoarseness, sneezing and HA. Feels like sinus pressure. Purulent phlegm and sputum. Denies fever. No dyspnea. Throat hurts with cough. No earache.   Depression screen [facility] 2/9 03/06/2021 07/13/2020 06/21/2020 06/13/2020 05/10/2020  Decreased Interest 0 0 0 0 0  Down, Depressed, Hopeless 0 0 0 0 0  PHQ - 2 Score 0 0 0 0 0  Altered sleeping - - - - -  Tired, decreased energy - - - - -  Change in appetite - - - - -  Feeling bad or failure about yourself  - - - - -  Trouble concentrating - - - - -  Moving slowly or fidgety/restless - - - - -  Suicidal thoughts - - - - -  PHQ-9 Score - - - - -  Difficult doing work/chores - - - - -  Some recent data might be hidden     Relevant past medical, surgical, family and social history reviewed and updated as indicated.  Interim medical history since our last visit reviewed. Allergies and medications reviewed and updated.  ROS:  Review of Systems  Constitutional: Negative for activity change, appetite change, chills and fever.  HENT: Positive for congestion, postnasal drip, rhinorrhea and sinus pressure. Negative for ear discharge, ear pain, hearing loss, nosebleeds, sneezing and trouble swallowing.   Respiratory: Negative for chest tightness and shortness of breath.   Cardiovascular: Negative for chest pain and palpitations.  Skin: Negative for rash.     Social History   Tobacco Use  Smoking Status Former Smoker  . Years: 1.00  . Types: Cigarettes  . Quit date: 12/30/1994  . Years since quitting: 26.2  Smokeless Tobacco Never Used  Tobacco Comment   2-3 daily       Objective:     Wt Readings from Last 3 Encounters:  03/06/21 130 lb 3.2 oz (59.1 kg)  12/01/20 129 lb 9.6 oz (58.8 kg)  11/28/20 140 lb (63.5 kg)     Exam deferred. Pt. Harboring due to COVID 19. Phone  visit performed.   Assessment & Plan:   1. Acute maxillary sinusitis, recurrence not specified     Meds ordered this encounter  Medications  . Pseudoephedrine-Guaifenesin [phone] MG TB12    Sig: Take 1 tablet by mouth 2 (two) times daily. For congestion    Dispense:  12 tablet    Refill:  1  . amoxicillin-clavulanate (AUGMENTIN) 875-125 MG tablet    Sig: Take 1 tablet by mouth 2 (two) times daily. Take all of this medication    Dispense:  20 tablet    Refill:  0    No orders of the defined types were placed in this encounter.     Diagnoses and all orders for this visit:  Acute maxillary sinusitis, recurrence not specified  Other orders -     Pseudoephedrine-Guaifenesin [phone] MG TB12; Take 1 tablet by mouth 2 (two) times daily. For congestion -     amoxicillin-clavulanate (AUGMENTIN) 875-125 MG tablet; Take 1 tablet by mouth 2 (two) times daily. Take all of this medication    Virtual Visit via telephone Note  I discussed the limitations, risks, security and privacy concerns of performing an evaluation and management service by telephone and the availability of in person appointments. The patient was identified with two  identifiers. Pt.expressed understanding and agreed to proceed. Pt. Is at home. Dr. Livia Snellen is in his office.  Follow Up Instructions:   I discussed the assessment and treatment plan with the patient. The patient was provided an opportunity to ask questions and all were answered. The patient agreed with the plan and demonstrated an understanding of the instructions.   The patient was advised to call back or seek an in-person evaluation if the symptoms worsen or if the condition fails to improve as anticipated.   Total minutes including chart review and phone contact time: 8   Follow up plan: Return if symptoms worsen or fail to improve.  Claretta Fraise, MD [place]

## 2021-04-04 ENCOUNTER — Other Ambulatory Visit: Payer: Self-pay | Admitting: Family Medicine

## 2021-04-04 ENCOUNTER — Telehealth: Payer: Self-pay

## 2021-04-04 NOTE — Telephone Encounter (Signed)
She could use dayquil/nyquil instead

## 2021-04-04 NOTE — Telephone Encounter (Signed)
Patient aware.

## 2021-04-10 ENCOUNTER — Encounter: Payer: Self-pay | Admitting: Family Medicine

## 2021-04-10 ENCOUNTER — Other Ambulatory Visit: Payer: Self-pay

## 2021-04-10 ENCOUNTER — Ambulatory Visit (INDEPENDENT_AMBULATORY_CARE_PROVIDER_SITE_OTHER): Payer: Medicaid Other | Admitting: Family Medicine

## 2021-04-10 VITALS — BP 112/78 | HR 90 | Temp 98.3°F | Ht 62.0 in | Wt 127.6 lb

## 2021-04-10 DIAGNOSIS — F411 Generalized anxiety disorder: Secondary | ICD-10-CM

## 2021-04-10 DIAGNOSIS — J0100 Acute maxillary sinusitis, unspecified: Secondary | ICD-10-CM | POA: Diagnosis not present

## 2021-04-10 MED ORDER — MOXIFLOXACIN HCL 400 MG PO TABS: 400.0000 mg | ORAL_TABLET | Freq: Every day | ORAL | 0 refills | Status: DC

## 2021-04-10 MED ORDER — BETAMETHASONE SOD PHOS & ACET 6 (3-3) MG/ML IJ SUSP
6.0000 mg | Freq: Once | INTRAMUSCULAR | Status: AC
  Administered 2021-04-10: 6 mg via INTRAMUSCULAR

## 2021-04-10 NOTE — Progress Notes (Signed)
Subjective:  Patient ID: Laurie Howell, female    DOB: Oct 15, 1976  Age: 45 y.o. MRN: [id number]  CC: Anxiety   HPI Laurie Howell presents for Symptoms include congestion, frontal & facial pain, nasal congestion,  productive cough, post nasal drip and sinus pressure. There is no fever, chills, or sweats. Onset of symptoms was a few days ago, gradually worsening since that time. Mucinex D caused rash, vomiting and HA.  Anxiety sx much better. Still some trouble with sleep.    GAD 7 : Generalized Anxiety Score 04/10/2021 03/28/2020 08/13/2019  Nervous, Anxious, on Edge 0 0 3  Control/stop worrying 0 0 3  Worry too much - different things 1 0 3  Trouble relaxing 1 0 1  Restless 0 0 0  Easily annoyed or irritable 1 0 3  Afraid - awful might happen 0 0 0  Total GAD 7 Score 3 0 13  Anxiety Difficulty Somewhat difficult - -      Depression screen [facility] 2/9 04/10/2021 04/10/2021 03/06/2021  Decreased Interest 0 0 0  Down, Depressed, Hopeless 0 0 0  PHQ - 2 Score 0 0 0  Altered sleeping 3 - -  Tired, decreased energy 3 - -  Change in appetite 0 - -  Feeling bad or failure about yourself  0 - -  Trouble concentrating 0 - -  Moving slowly or fidgety/restless 0 - -  Suicidal thoughts 0 - -  PHQ-9 Score 6 - -  Difficult doing work/chores Somewhat difficult - -  Some recent data might be hidden    History Laurie Howell has a past medical history of Anemia, Anovulation (01/23/2017), Chronic gastritis, DUB (dysfunctional uterine bleeding) (06/03/2017), Esophageal stricture, Fibroid (06/18/2017), Frequent UTI, GERD (gastroesophageal reflux disease), Headache, 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   She has a past surgical history that includes Tubal ligation (1997);  Tubal reversal (02/2015); Esophagogastroduodenoscopy (N/A, 06/27/2016); Savory dilation (N/A, 06/27/2016); and Hysteroscopy (N/A, 03/22/2020).   Her family history includes Anxiety disorder in her father; Depression in her mother; Heart attack (age of onset: 21) in her father; Hypertension in her father and mother; Other (age of onset: 94) in her mother; Peripheral vascular disease (age of onset: 37) in her father; Stroke in her maternal grandmother.She reports that she quit smoking about 26 years ago. Her smoking use included cigarettes. She quit after 1.00 year of use. She has never used smokeless tobacco. She reports that she does not drink alcohol and does not use drugs.    ROS Review of Systems  Constitutional: Negative for activity change, appetite change, chills and fever.  HENT: Positive for congestion, postnasal drip, rhinorrhea and sinus pressure. Negative for ear discharge, ear pain, hearing loss, nosebleeds, sneezing and trouble swallowing.   Respiratory: Negative for chest tightness and shortness of breath.   Cardiovascular: Negative for chest pain and palpitations.  Skin: Negative for rash.    Objective:  BP 112/78   Pulse 90   Temp 98.3 F (36.8 C)   Ht 5\' 2"  (1.575 m)   Wt 127 lb 9.6 oz (57.9 kg)   SpO2 95%   BMI 23.34 kg/m   BP Readings from Last 3 Encounters:  04/10/21 112/78  03/06/21 107/70  12/01/20 113/77    Wt Readings from Last 3 Encounters:  04/10/21 127 lb 9.6 oz (57.9 kg)  03/06/21 130 lb 3.2 oz (59.1 kg)  12/01/20 129 lb 9.6 oz (58.8 kg)     Physical Exam Constitutional:      Appearance: She is well-developed.  HENT:     Head: Normocephalic and atraumatic.     Right Ear: Tympanic membrane and external ear normal. No decreased hearing noted.     Left Ear: Tympanic membrane and external ear normal. No decreased hearing noted.     Nose: Mucosal edema present.     Right Sinus: No frontal sinus tenderness.     Left Sinus: No frontal sinus  tenderness.     Mouth/Throat:     Pharynx: No oropharyngeal exudate or posterior oropharyngeal erythema.  Neck:     Meningeal: Brudzinski's sign absent.  Pulmonary:     Effort: No respiratory distress.     Breath sounds: Normal breath sounds.  Lymphadenopathy:     Head:     Right side of head: No preauricular adenopathy.     Left side of head: No preauricular adenopathy.     Cervical:     Right cervical: No superficial cervical adenopathy.    Left cervical: No superficial cervical adenopathy.       Assessment & Plan:   [name] was seen today for anxiety.  Diagnoses and all orders for this visit:  GAD (generalized anxiety disorder)  Acute maxillary sinusitis, recurrence not specified -     betamethasone acetate-betamethasone sodium phosphate (CELESTONE) injection 6 mg  Other orders -     moxifloxacin (AVELOX) 400 MG tablet; Take 1 tablet (400 mg total) by mouth daily. Take all of these, for infection       I have discontinued Chai S. Lindy's amoxicillin-clavulanate. I am also having her start on moxifloxacin. Additionally, I am having her maintain her acetaminophen, DULoxetine, and Magnesium. We will continue to administer betamethasone acetate-betamethasone sodium phosphate.  Allergies as of 04/10/2021      Reactions   Ivp Dye [iodinated Diagnostic Agents] Anaphylaxis   Shellfish Allergy Anaphylaxis   Mucinex Dm [dm-guaifenesin Er] Hives, Itching, Nausea And Vomiting   Aspirin Swelling, Other (See Comments)   Reaction:  Facial swelling       Medication List       Accurate as of April 10, 2021  3:25 PM. If you have any questions, ask your nurse or doctor.        STOP taking these medications   amoxicillin-clavulanate 875-125 MG tablet Commonly known as: AUGMENTIN Stopped by: Claretta Fraise, MD     TAKE these medications   acetaminophen 500 MG tablet Commonly known as: TYLENOL Take 500 mg by mouth every 6 (six) hours as needed.   DULoxetine 60 MG  capsule Commonly known as: Cymbalta Take 1 capsule (60 mg total) by mouth daily.   Magnesium 400 MG Tabs Take 400 mg by mouth every evening.   moxifloxacin 400 MG tablet Commonly known as: Avelox Take 1 tablet (400 mg total) by mouth daily. Take all of these, for infection Started by: Claretta Fraise, MD        Follow-up: Return in about 3 months (around 07/10/2021).  Claretta Fraise, M.D.

## 2021-04-10 NOTE — Patient Instructions (Signed)

## 2021-05-05 IMAGING — DX DG FOOT COMPLETE 3+V*L*
3 series · 3 of 3 positions shown · non-contrast
Comparison: None.

CLINICAL DATA: Fall today. Left foot and toe pain. Initial
encounter.

EXAM:
LEFT FOOT - COMPLETE 3+ VIEW

[foot ap]
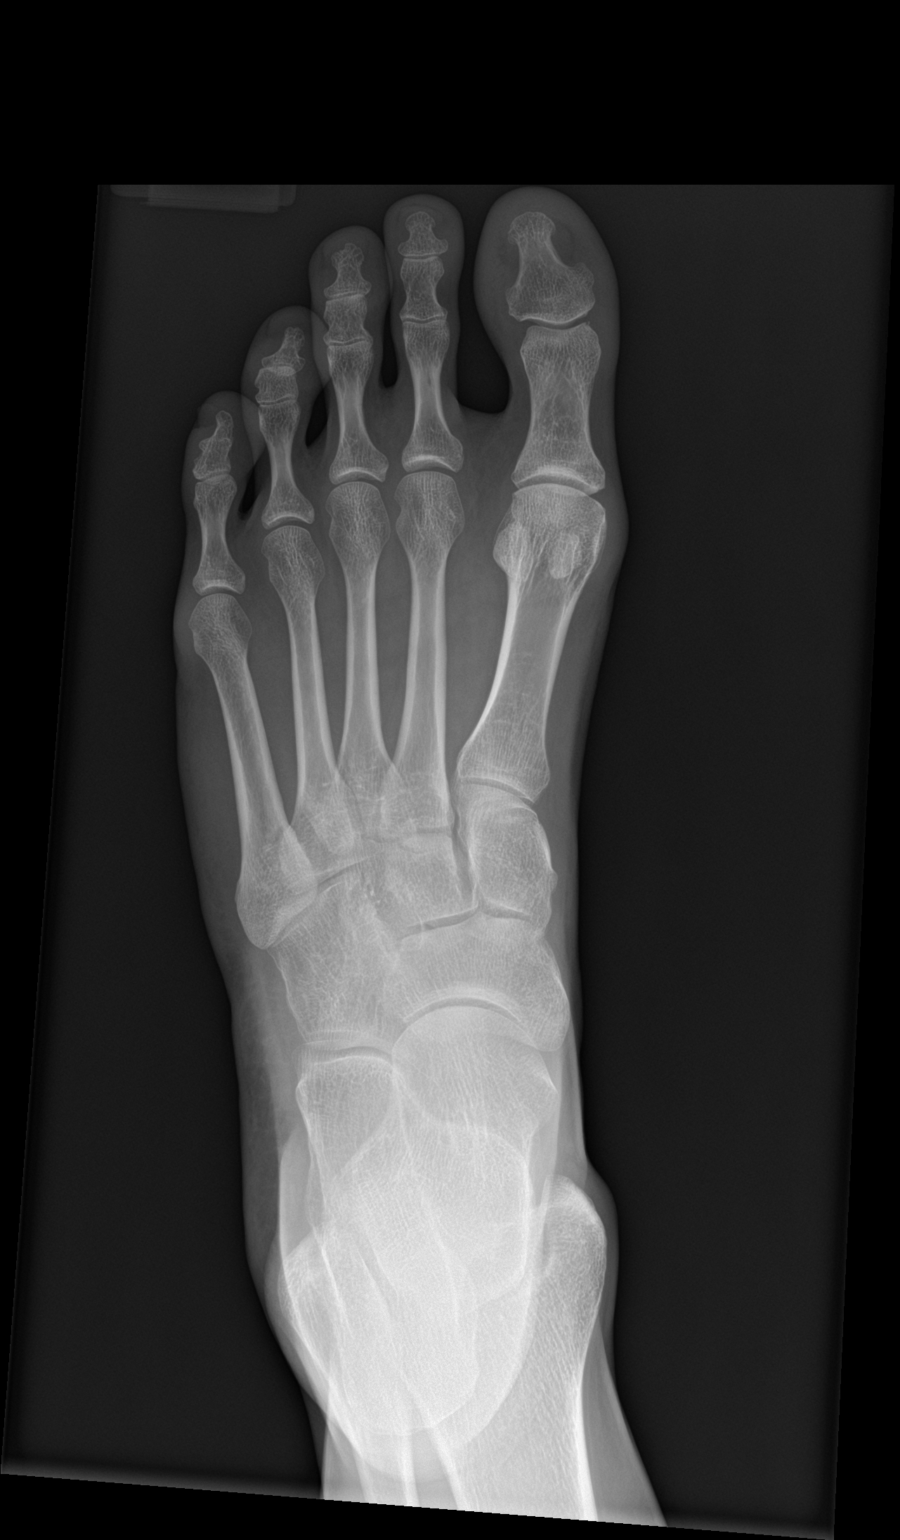

[foot obl]
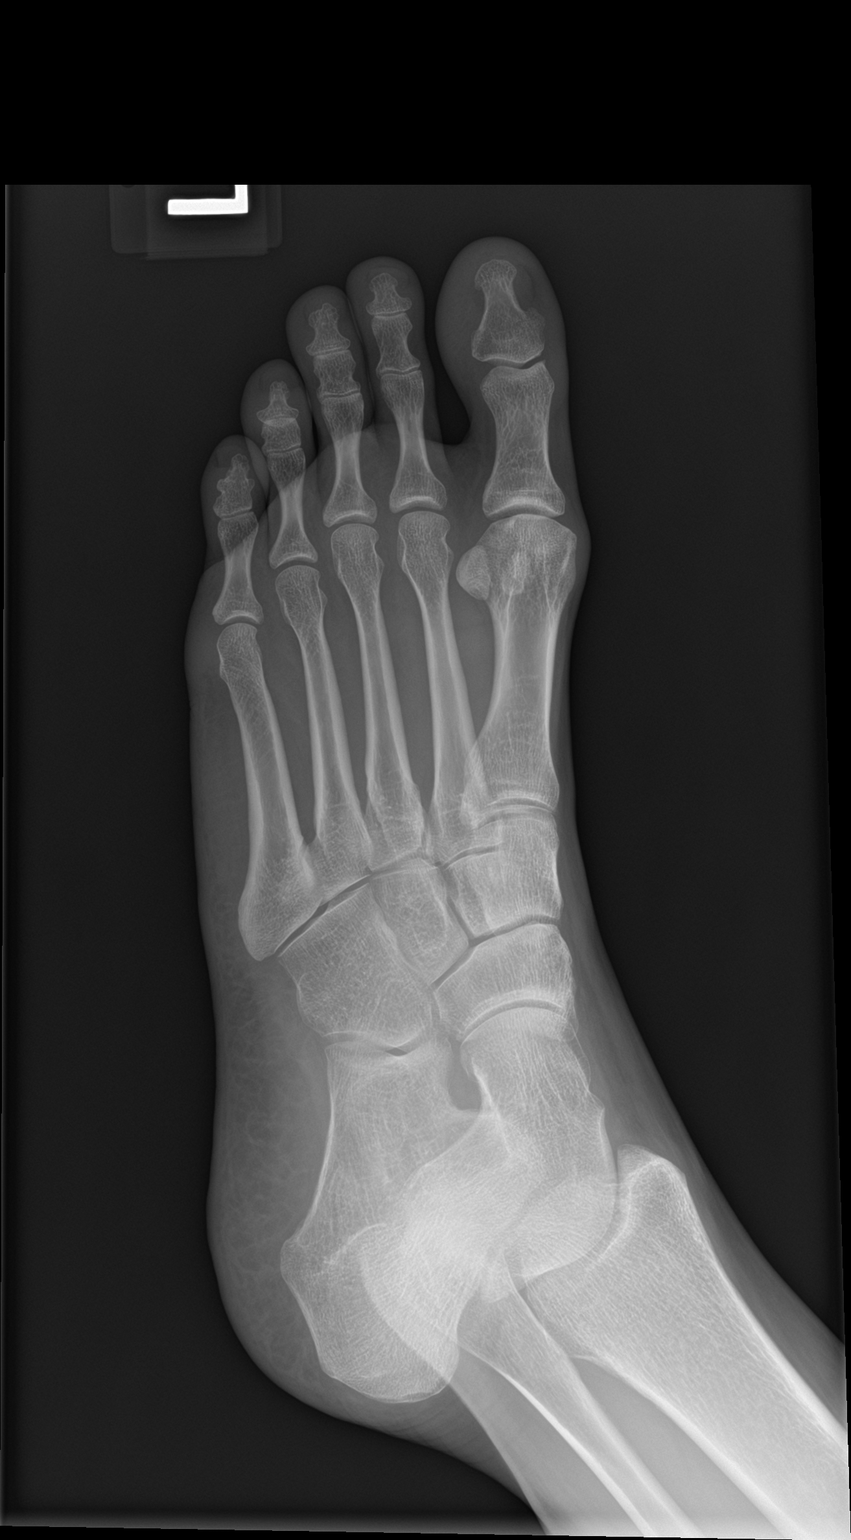

[foot lat]
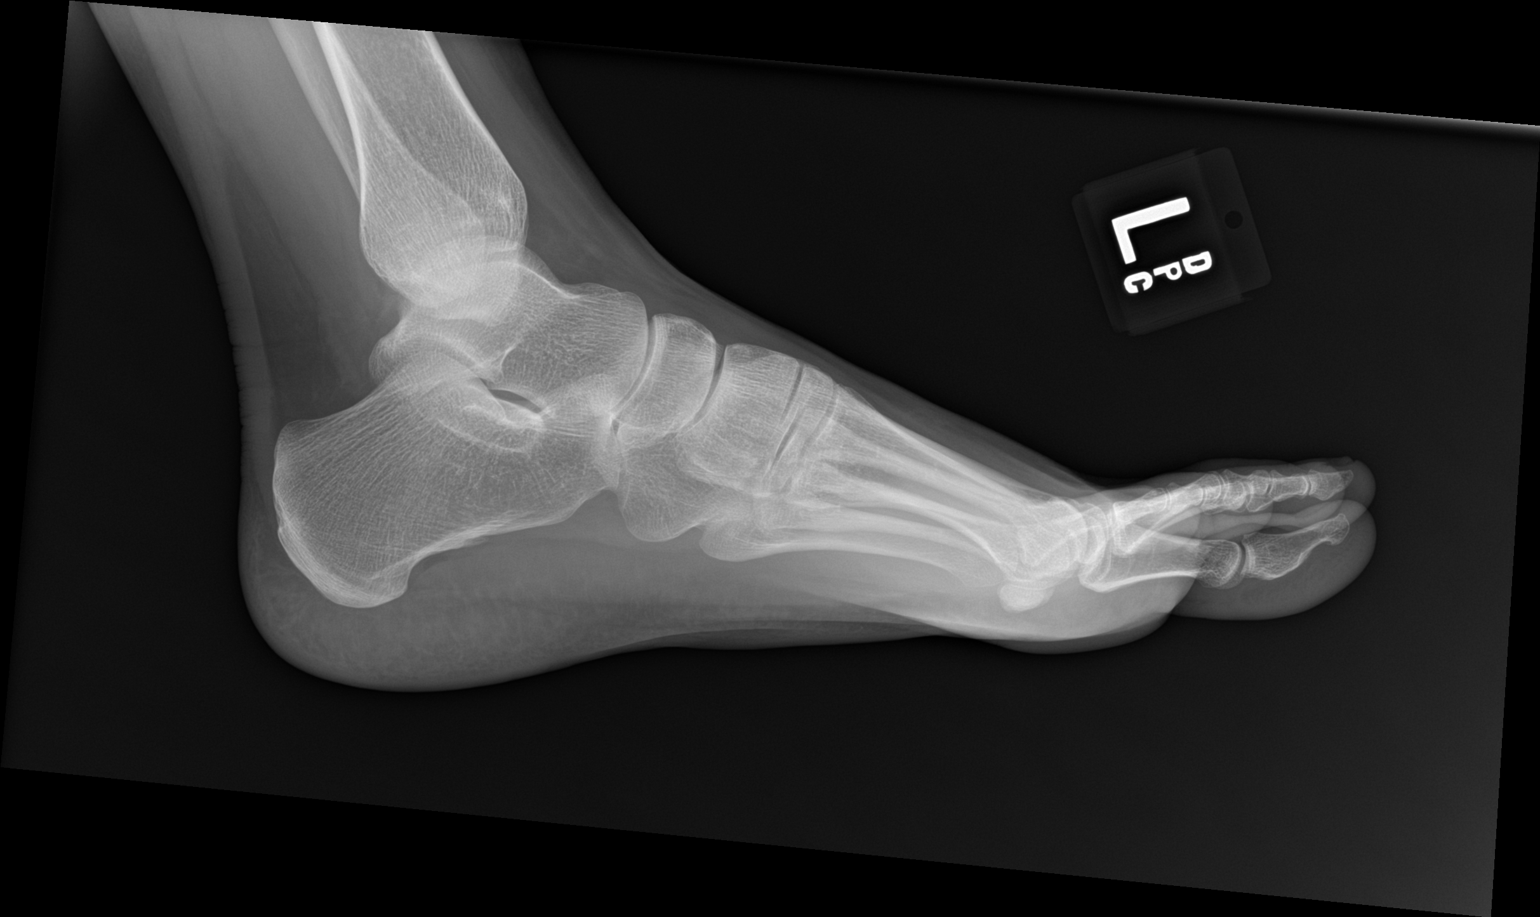

[3 of 3 positions shown; findings below may reference images not displayed]

FINDINGS: There is no evidence of fracture or dislocation. There is no
evidence of arthropathy or other focal bone abnormality. Soft
tissues are unremarkable.
IMPRESSION: Negative.

## 2021-06-08 ENCOUNTER — Other Ambulatory Visit: Payer: Self-pay | Admitting: Family Medicine

## 2021-06-08 DIAGNOSIS — F411 Generalized anxiety disorder: Secondary | ICD-10-CM

## 2021-07-01 ENCOUNTER — Other Ambulatory Visit: Payer: Self-pay | Admitting: Family Medicine

## 2021-07-01 DIAGNOSIS — F411 Generalized anxiety disorder: Secondary | ICD-10-CM

## 2021-07-10 ENCOUNTER — Ambulatory Visit (INDEPENDENT_AMBULATORY_CARE_PROVIDER_SITE_OTHER): Payer: Medicaid Other | Admitting: Family Medicine

## 2021-07-10 ENCOUNTER — Encounter: Payer: Self-pay | Admitting: Family Medicine

## 2021-07-10 ENCOUNTER — Other Ambulatory Visit: Payer: Self-pay

## 2021-07-10 VITALS — BP 104/67 | HR 90 | Temp 98.2°F | Ht 62.0 in | Wt 131.2 lb

## 2021-07-10 DIAGNOSIS — D509 Iron deficiency anemia, unspecified: Secondary | ICD-10-CM | POA: Diagnosis not present

## 2021-07-10 DIAGNOSIS — K581 Irritable bowel syndrome with constipation: Secondary | ICD-10-CM

## 2021-07-10 DIAGNOSIS — F411 Generalized anxiety disorder: Secondary | ICD-10-CM | POA: Diagnosis not present

## 2021-07-10 DIAGNOSIS — R7989 Other specified abnormal findings of blood chemistry: Secondary | ICD-10-CM | POA: Diagnosis not present

## 2021-07-10 DIAGNOSIS — E785 Hyperlipidemia, unspecified: Secondary | ICD-10-CM

## 2021-07-10 DIAGNOSIS — R5382 Chronic fatigue, unspecified: Secondary | ICD-10-CM | POA: Diagnosis not present

## 2021-07-10 MED ORDER — DULOXETINE HCL 60 MG PO CPEP: ORAL_CAPSULE | ORAL | 3 refills | Status: DC

## 2021-07-10 MED ORDER — LINACLOTIDE 145 MCG PO CAPS: 145.0000 ug | ORAL_CAPSULE | Freq: Every day | ORAL | 5 refills | Status: DC

## 2021-07-10 MED ORDER — PANTOPRAZOLE SODIUM 40 MG PO TBEC: 40.0000 mg | DELAYED_RELEASE_TABLET | Freq: Every day | ORAL | 11 refills | Status: DC

## 2021-07-10 MED ORDER — DULOXETINE HCL 60 MG PO CPEP: 120.0000 mg | ORAL_CAPSULE | Freq: Every day | ORAL | 1 refills | Status: DC

## 2021-07-10 NOTE — Progress Notes (Signed)
Subjective:  Patient ID: Laurie Howell, female    DOB: 08-14-1976  Age: 45 y.o. MRN: [id number]  CC: Medical Management of Chronic Issues   HPI KEIMYA BRIDDELL presents for getting agitated, ill, crying. Tired all the time.  When eating gets full real fast. Stomach gets big. Belly looks pregnant. Onset when she had her tubes untied 5 years ago. Has to take something to have a bowel movement. She can go a month if she doesn't take something. Uses chocolate laxatives. Constant heartburn.   Depression screen [facility] 2/9 07/10/2021 04/10/2021 04/10/2021  Decreased Interest 0 0 0  Down, Depressed, Hopeless 0 0 0  PHQ - 2 Score 0 0 0  Altered sleeping - 3 -  Tired, decreased energy - 3 -  Change in appetite - 0 -  Feeling bad or failure about yourself  - 0 -  Trouble concentrating - 0 -  Moving slowly or fidgety/restless - 0 -  Suicidal thoughts - 0 -  PHQ-9 Score - 6 -  Difficult doing work/chores - Somewhat difficult -  Some recent data might be hidden   GAD 7 : Generalized Anxiety Score 04/10/2021 03/28/2020 08/13/2019  Nervous, Anxious, on Edge 0 0 3  Control/stop worrying 0 0 3  Worry too much - different things 1 0 3  Trouble relaxing 1 0 1  Restless 0 0 0  Easily annoyed or irritable 1 0 3  Afraid - awful might happen 0 0 0  Total GAD 7 Score 3 0 13  Anxiety Difficulty Somewhat difficult - -     History Manda has a past medical history of Anemia, Anovulation (01/23/2017), Chronic gastritis, DUB (dysfunctional uterine bleeding) (06/03/2017), Esophageal stricture, Fibroid (06/18/2017), Frequent UTI, GERD (gastroesophageal reflux disease), Headache, 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   She has a past surgical history that includes Tubal ligation  (1997); Tubal reversal (02/2015); Esophagogastroduodenoscopy (N/A, 06/27/2016); Savory dilation (N/A, 06/27/2016); and Hysteroscopy (N/A, 03/22/2020).   Her family history includes Anxiety disorder in her father; Depression in her mother; Heart attack (age of onset: 82) in her father; Hypertension in her father and mother; Other (age of onset: 32) in her mother; Peripheral vascular disease (age of onset: 31) in her father; Stroke in her maternal grandmother.She reports that she quit smoking about 26 years ago. Her smoking use included cigarettes. She has never used smokeless tobacco. She reports that she does not drink alcohol and does not use drugs.    ROS Review of Systems  Constitutional: Negative.   HENT: Negative.    Eyes:  Negative for visual disturbance.  Respiratory:  Negative for shortness of breath.   Cardiovascular:  Negative for chest pain.  Gastrointestinal:  Negative for abdominal pain.  Musculoskeletal:  Negative for arthralgias.  Psychiatric/Behavioral:  Positive for agitation, dysphoric mood and sleep disturbance. The patient is nervous/anxious.    Objective:  BP 104/67   Pulse 90   Temp 98.2 F (36.8 C)   Ht $R'5\' 2"'sZ$  (1.575 m)   Wt 131 lb 3.2 oz (59.5 kg)   SpO2 96%   BMI 24.00 kg/m   BP Readings from Last 3 Encounters:  07/10/21 104/67  04/10/21 112/78  03/06/21 107/70    Wt Readings from Last 3 Encounters:  07/10/21 131 lb 3.2 oz (59.5 kg)  04/10/21 127 lb 9.6 oz (  57.9 kg)  03/06/21 130 lb 3.2 oz (59.1 kg)     Physical Exam Constitutional:      General: She is not in acute distress.    Appearance: She is well-developed.  HENT:     Head: Normocephalic and atraumatic.  Eyes:     Conjunctiva/sclera: Conjunctivae normal.     Pupils: Pupils are equal, round, and reactive to light.  Neck:     Thyroid: No thyromegaly.  Cardiovascular:     Rate and Rhythm: Normal rate and regular rhythm.     Heart sounds: Normal heart sounds. No murmur heard. Pulmonary:      Effort: Pulmonary effort is normal. No respiratory distress.     Breath sounds: Normal breath sounds. No wheezing or rales.  Abdominal:     General: Bowel sounds are normal. There is no distension.     Palpations: Abdomen is soft.     Tenderness: There is no abdominal tenderness.  Musculoskeletal:        General: Normal range of motion.     Cervical back: Normal range of motion and neck supple.  Lymphadenopathy:     Cervical: No cervical adenopathy.  Skin:    General: Skin is warm and dry.  Neurological:     Mental Status: She is alert and oriented to person, place, and time.  Psychiatric:        Behavior: Behavior normal.        Thought Content: Thought content normal.        Judgment: Judgment normal.      Assessment & Plan:   Tynasia was seen today for medical management of chronic issues.  Diagnoses and all orders for this visit:  Abnormal thyroid blood test -     TSH + free T4  Iron deficiency anemia, unspecified iron deficiency anemia type -     CBC with Differential/Platelet -     CMP14+EGFR  Chronic fatigue  Hyperlipidemia, unspecified hyperlipidemia type -     Lipid panel  GAD (generalized anxiety disorder) -     DULoxetine (CYMBALTA) 60 MG capsule; TAKE 1 CAPSULE BY MOUTH EVERY DAY       I have discontinued Danel S. Naugle's moxifloxacin. I am also having her maintain her acetaminophen, Magnesium, and DULoxetine.  Allergies as of 07/10/2021       Reactions   Ivp Dye [iodinated Diagnostic Agents] Anaphylaxis   Shellfish Allergy Anaphylaxis   Mucinex Dm [dm-guaifenesin Er] Hives, Itching, Nausea And Vomiting   Aspirin Swelling, Other (See Comments)   Reaction:  Facial swelling         Medication List        Accurate as of July 10, 2021  1:55 PM. If you have any questions, ask your nurse or doctor.          STOP taking these medications    moxifloxacin 400 MG tablet Commonly known as: Avelox Stopped by: Claretta Fraise, MD        TAKE these medications    acetaminophen 500 MG tablet Commonly known as: TYLENOL Take 500 mg by mouth every 6 (six) hours as needed.   DULoxetine 60 MG capsule Commonly known as: CYMBALTA TAKE 1 CAPSULE BY MOUTH EVERY DAY What changed:  how much to take how to take this when to take this additional instructions Changed by: Claretta Fraise, MD   Magnesium 400 MG Tabs Take 400 mg by mouth every evening.         Follow-up: No follow-ups on file.  Vivan Vanderveer, M.D.  

## 2021-08-17 ENCOUNTER — Encounter: Payer: Self-pay | Admitting: Family Medicine

## 2021-08-17 ENCOUNTER — Other Ambulatory Visit: Payer: Self-pay

## 2021-08-17 ENCOUNTER — Ambulatory Visit (INDEPENDENT_AMBULATORY_CARE_PROVIDER_SITE_OTHER): Payer: Medicaid Other | Admitting: Family Medicine

## 2021-08-17 VITALS — BP 116/76 | HR 104 | Temp 97.7°F | Ht 62.0 in | Wt 127.2 lb

## 2021-08-17 DIAGNOSIS — R1084 Generalized abdominal pain: Secondary | ICD-10-CM

## 2021-08-17 DIAGNOSIS — R232 Flushing: Secondary | ICD-10-CM | POA: Diagnosis not present

## 2021-08-17 LAB — URINALYSIS
Bilirubin, UA: NEGATIVE
Glucose, UA: NEGATIVE
Nitrite, UA: NEGATIVE
Specific Gravity, UA: >1.03 — ABNORMAL HIGH (ref 1.005–1.030)
Urobilinogen, Ur: 0.2 mg/dL (ref 0.2–1.0)
pH, UA: 5.5 (ref 5.0–7.5)

## 2021-08-17 MED ORDER — LINACLOTIDE 290 MCG PO CAPS: 290.0000 ug | ORAL_CAPSULE | Freq: Every day | ORAL | 1 refills | Status: DC

## 2021-08-17 NOTE — Progress Notes (Signed)
Hot, faint, dizzy. Onssr  Subjective:  Patient ID: Laurie Howell, female    DOB: 09-30-1976  Age: 45 y.o. MRN: [id number]  CC: Dizziness, Headache, and Hot Flashes   HPI Laurie Howell presents for 4days of feeling Hot faint and dizzy. No menses due to ablation. Bad leg cramps. In the past has been like this when her iron was low and/or her potassium was low. Had low potassium inexplicably before. Had these symptoms, but work up was negative.   Depression screen [facility] 2/9 08/17/2021 07/10/2021 04/10/2021  Decreased Interest 0 0 0  Down, Depressed, Hopeless 0 0 0  PHQ - 2 Score 0 0 0  Altered sleeping - - 3  Tired, decreased energy - - 3  Change in appetite - - 0  Feeling bad or failure about yourself  - - 0  Trouble concentrating - - 0  Moving slowly or fidgety/restless - - 0  Suicidal thoughts - - 0  PHQ-9 Score - - 6  Difficult doing work/chores - - Somewhat difficult  Some recent data might be hidden    History Laurie Howell has a past medical history of Anemia, Anovulation (01/23/2017), Chronic gastritis, DUB (dysfunctional uterine bleeding) (06/03/2017), Esophageal stricture, Fibroid (06/18/2017), Frequent UTI, GERD (gastroesophageal reflux disease), Headache, 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   She has a past surgical history that includes Tubal ligation (1997); Tubal reversal (02/2015); Esophagogastroduodenoscopy (N/A, 06/27/2016); Savory dilation (N/A, 06/27/2016); and Hysteroscopy (N/A, 03/22/2020).   Her family history includes Anxiety disorder in her father; Depression in her mother; Heart attack (age of onset: 86) in her father; Hypertension in her father and mother; Other (age of onset: 46) in her mother; Peripheral vascular disease (age of onset:  52) in her father; Stroke in her maternal grandmother.She reports that she quit smoking about 26 years ago. Her smoking use included cigarettes. She has never used smokeless tobacco. She reports that she does not drink alcohol and does not use drugs.    ROS Review of Systems  Constitutional: Negative.   HENT: Negative.    Eyes:  Negative for visual disturbance.  Respiratory:  Negative for shortness of breath.   Cardiovascular:  Negative for chest pain.  Gastrointestinal:  Positive for abdominal pain.  Musculoskeletal:  Negative for arthralgias.   Objective:  BP 116/76   Pulse (!) 104   Temp 97.7 F (36.5 C)   Ht '5\' 2"'  (1.575 m)   Wt 127 lb 3.2 oz (57.7 kg)   SpO2 95%   BMI 23.27 kg/m   BP Readings from Last 3 Encounters:  08/17/21 116/76  07/10/21 104/67  04/10/21 112/78    Wt Readings from Last 3 Encounters:  08/17/21 127 lb 3.2 oz (57.7 kg)  07/10/21 131 lb 3.2 oz (59.5 kg)  04/10/21 127 lb 9.6 oz (57.9 kg)     Physical Exam Constitutional:      General: She is not in acute distress.    Appearance: She is well-developed.  HENT:     Head: Normocephalic and atraumatic.  Cardiovascular:     Rate and Rhythm: Normal rate and regular rhythm.     Heart sounds: No murmur heard. Pulmonary:     Effort: Pulmonary effort is normal.     Breath sounds: Normal breath sounds.  Abdominal:     General: Bowel sounds are normal.  Palpations: Abdomen is soft. There is no mass.     Tenderness: There is abdominal tenderness. There is no guarding or rebound.  Musculoskeletal:        General: Normal range of motion.  Skin:    General: Skin is warm and dry.  Neurological:     Mental Status: She is alert and oriented to person, place, and time.  Psychiatric:        Behavior: Behavior normal.      Assessment & Plan:   Laurie Howell was seen today for dizziness, headache and hot flashes.  Diagnoses and all orders for this visit:  Generalized abdominal pain -     Ambulatory  referral to Gastroenterology -     CMP14+EGFR -     CBC with Differential/Platelet -     Anemia Profile B -     Magnesium -     Phosphorus -     FSH/LH -     Urinalysis -     Urine Culture  Hot flashes -     CMP14+EGFR -     CBC with Differential/Platelet -     Anemia Profile B -     Magnesium -     Phosphorus -     FSH/LH  Other orders -     linaclotide (LINZESS) 290 MCG CAPS capsule; Take 1 capsule (290 mcg total) by mouth daily. To regulate bowel movements      I have changed Laurie Howell's linaclotide. I am also having her maintain her acetaminophen, Magnesium, DULoxetine, and pantoprazole.  Allergies as of 08/17/2021       Reactions   Ivp Dye [iodinated Diagnostic Agents] Anaphylaxis   Shellfish Allergy Anaphylaxis   Mucinex Dm [dm-guaifenesin Er] Hives, Itching, Nausea And Vomiting   Aspirin Swelling, Other (See Comments)   Reaction:  Facial swelling         Medication List        Accurate as of August 17, 2021 11:59 PM. If you have any questions, ask your nurse or doctor.          acetaminophen 500 MG tablet Commonly known as: TYLENOL Take 500 mg by mouth every 6 (six) hours as needed.   DULoxetine 60 MG capsule Commonly known as: CYMBALTA Take 2 capsules (120 mg total) by mouth daily.   linaclotide 290 MCG Caps capsule Commonly known as: Linzess Take 1 capsule (290 mcg total) by mouth daily. To regulate bowel movements What changed:  medication strength how much to take Changed by: Claretta Fraise, MD   Magnesium 400 MG Tabs Take 400 mg by mouth every evening.   pantoprazole 40 MG tablet Commonly known as: PROTONIX Take 1 tablet (40 mg total) by mouth daily. For stomach         Follow-up: Return in about 3 months (around 11/17/2021).  Claretta Fraise, M.D.

## 2021-08-18 LAB — CMP14+EGFR
ALT: 9 IU/L (ref 0–32)
AST: 17 IU/L (ref 0–40)
Albumin/Globulin Ratio: 1.5 (ref 1.2–2.2)
Albumin: 4.4 g/dL (ref 3.8–4.8)
Alkaline Phosphatase: 84 IU/L (ref 44–121)
BUN/Creatinine Ratio: 12 (ref 9–23)
BUN: 9 mg/dL (ref 6–24)
Bilirubin Total: 0.3 mg/dL (ref 0.0–1.2)
CO2: 23 mmol/L (ref 20–29)
Calcium: 9.8 mg/dL (ref 8.7–10.2)
Chloride: 101 mmol/L (ref 96–106)
Creatinine, Ser: 0.75 mg/dL (ref 0.57–1.00)
Globulin, Total: 2.9 g/dL (ref 1.5–4.5)
Glucose: 84 mg/dL (ref 65–99)
Potassium: 5.1 mmol/L (ref 3.5–5.2)
Sodium: 136 mmol/L (ref 134–144)
Total Protein: 7.3 g/dL (ref 6.0–8.5)
eGFR: 100 mL/min/{1.73_m2} (ref >59)

## 2021-08-18 LAB — ANEMIA PROFILE B
Basophils Absolute: 0 10*3/uL (ref 0.0–0.2)
Basos: 1 %
EOS (ABSOLUTE): 0.1 10*3/uL (ref 0.0–0.4)
Eos: 1 %
Ferritin: 16 ng/mL (ref 15–150)
Folate: 3 ng/mL — ABNORMAL LOW (ref >3.0)
Hematocrit: 41.7 % (ref 34.0–46.6)
Hemoglobin: 14.2 g/dL (ref 11.1–15.9)
Immature Grans (Abs): 0 10*3/uL (ref 0.0–0.1)
Immature Granulocytes: 0 %
Iron Saturation: 12 % — ABNORMAL LOW (ref 15–55)
Iron: 45 ug/dL (ref 27–159)
Lymphocytes Absolute: 1.6 10*3/uL (ref 0.7–3.1)
Lymphs: 24 %
MCH: 32.2 pg (ref 26.6–33.0)
MCHC: 34.1 g/dL (ref 31.5–35.7)
MCV: 95 fL (ref 79–97)
Monocytes Absolute: 0.5 10*3/uL (ref 0.1–0.9)
Monocytes: 8 %
Neutrophils Absolute: 4.5 10*3/uL (ref 1.4–7.0)
Neutrophils: 66 %
Platelets: 332 10*3/uL (ref 150–450)
RBC: 4.41 x10E6/uL (ref 3.77–5.28)
RDW: 13.4 % (ref 11.7–15.4)
Retic Ct Pct: 1 % (ref 0.6–2.6)
Total Iron Binding Capacity: 376 ug/dL (ref 250–450)
UIBC: 331 ug/dL (ref 131–425)
Vitamin B-12: 699 pg/mL (ref 232–1245)
WBC: 6.8 10*3/uL (ref 3.4–10.8)

## 2021-08-18 LAB — MAGNESIUM: Magnesium: 1.9 mg/dL (ref 1.6–2.3)

## 2021-08-18 LAB — FSH/LH
FSH: 9.5 m[IU]/mL
LH: 9.9 m[IU]/mL

## 2021-08-18 LAB — PHOSPHORUS: Phosphorus: 2.8 mg/dL — ABNORMAL LOW (ref 3.0–4.3)

## 2021-08-20 ENCOUNTER — Encounter: Payer: Self-pay | Admitting: Family Medicine

## 2021-08-20 ENCOUNTER — Other Ambulatory Visit: Payer: Self-pay | Admitting: Family Medicine

## 2021-08-20 DIAGNOSIS — E611 Iron deficiency: Secondary | ICD-10-CM

## 2021-08-20 LAB — URINE CULTURE

## 2021-08-20 MED ORDER — AMOXICILLIN 500 MG PO CAPS: 500.0000 mg | ORAL_CAPSULE | Freq: Three times a day (TID) | ORAL | 0 refills | Status: DC

## 2021-08-21 ENCOUNTER — Ambulatory Visit: Payer: Medicaid Other | Admitting: Family Medicine

## 2021-08-23 ENCOUNTER — Encounter (HOSPITAL_COMMUNITY): Payer: Self-pay

## 2021-08-23 ENCOUNTER — Other Ambulatory Visit: Payer: Self-pay

## 2021-08-24 ENCOUNTER — Encounter (HOSPITAL_COMMUNITY): Payer: Self-pay | Admitting: Hematology and Oncology

## 2021-08-24 ENCOUNTER — Inpatient Hospital Stay (HOSPITAL_COMMUNITY): Payer: Medicaid Other | Attending: Hematology and Oncology | Admitting: Hematology and Oncology

## 2021-08-24 VITALS — BP 121/79 | HR 97 | Temp 98.4°F | Resp 16 | Wt 128.9 lb

## 2021-08-24 DIAGNOSIS — Z87891 Personal history of nicotine dependence: Secondary | ICD-10-CM | POA: Insufficient documentation

## 2021-08-24 DIAGNOSIS — D509 Iron deficiency anemia, unspecified: Secondary | ICD-10-CM | POA: Insufficient documentation

## 2021-08-24 DIAGNOSIS — R252 Cramp and spasm: Secondary | ICD-10-CM | POA: Diagnosis not present

## 2021-08-24 NOTE — Progress Notes (Signed)
[facility] CONSULT NOTE  Patient Care Team: Claretta Fraise, MD as PCP - General (Family Medicine) Minus Breeding, MD as PCP - Cardiology (Cardiology)  CHIEF COMPLAINTS/PURPOSE OF CONSULTATION:  IDA  ASSESSMENT & PLAN:   This is a very pleasant 45 year old female patient with past medical history significant for iron deficiency anemia requiring intravenous iron infusion now referred to hematology for further recommendations given her recent onset lower extremity cramps, lightheadedness which were symptoms similar to when she had iron deficiency.  She had some labs which indicated no anemia, ferritin at the lower limit of normal, 16.  No other abnormalities in CBC and CMP. She denies any ongoing menstrual bleeding, hematochezia or melena.  She had endometrial ablation about 2 years ago and since then has not had any menstrual issues. Physical examination, she appears unremarkable, no major findings. I reviewed labs with her and discussed that she can try over-the-counter iron supplementation once a day for the next 8 weeks and return to clinic with repeat labs.   At this time, we do not have sufficient evidence that she will need intravenous iron infusion.   I have also recommended that her clinical complaints may be unrelated to iron deficiency. She is agreeable to trying oral iron and repeating labs in 8 weeks.  If she continues to have persistent clinical symptoms and worsening anemia, we can certainly consider intravenous iron infusion. I have also recommended age appropriate cancer screening Thank you for consulting [place] the care of this patient.  Please not hesitate to contact us with any additional questions or concerns.  HISTORY OF PRESENTING ILLNESS:  Laurie Howell 45 y.o. female is here because of IDA  This is a very pleasant 45 year old female patient with past medical history significant for dysfunctional uterine bleeding, menorrhagia, referred to hematology for  evaluation of possible iron deficiency given her dizziness and lower extremity cramps.  She mentions that about 2 years ago when she was having heavy menstrual cycle, she had similar symptoms and was noted to have low iron and was given intravenous iron.  She started noticing the symptoms again about 1 to 2 weeks ago.    She mostly describes dizziness as nonpositional, of recent onset, denies any prior upper respiratory tract symptoms.  No new medications.  She has been recently treated for some kidney infection and is currently taking amoxicillin.  She denies any shortness of breath on exertion, pica, ongoing menstrual bleeding.  She otherwise seems to be in good health.  Rest of the pertinent 10 point ROS reviewed and negative.  REVIEW OF SYSTEMS:   Constitutional: Denies fevers, chills or abnormal night sweats Eyes: Denies blurriness of vision, double vision or watery eyes Ears, nose, mouth, throat, and face: Denies mucositis or sore throat Respiratory: Denies cough, dyspnea or wheezes Cardiovascular: Denies palpitation, chest discomfort or lower extremity swelling Gastrointestinal:  Denies nausea, heartburn or change in bowel habits Skin: Denies abnormal skin rashes Lymphatics: Denies new lymphadenopathy or easy bruising Neurological:Denies numbness, tingling or new weaknesses Behavioral/Psych: Mood is stable, no new changes  All other systems were reviewed with the patient and are negative.  MEDICAL HISTORY:  Past Medical History:  Diagnosis Date   Anemia    Anovulation 01/23/2017   Chronic gastritis    DUB (dysfunctional uterine bleeding) 06/03/2017   Esophageal stricture    Fibroid 06/18/2017   Frequent UTI    GERD (gastroesophageal reflux disease)    Headache    Infertility of tubal origin 05/01/2015  BTL before age 69 , tubal reanastomosis 03/06/2015 in [place]    Intractable chronic cluster headache 03/02/2020   Iron deficiency anemia 03/28/2016   due to menorrhagia   LLQ pain  06/03/2017   Menorrhagia with irregular cycle 01/13/2020   Menorrhagia with regular cycle 06/03/2017   Ovarian cyst    Pelvic pain 01/13/2020    SURGICAL HISTORY: Past Surgical History:  Procedure Laterality Date   ESOPHAGOGASTRODUODENOSCOPY N/A 06/27/2016   Procedure: ESOPHAGOGASTRODUODENOSCOPY (EGD);  Surgeon: Danie Binder, MD;  Location: [facility];  Service: Endoscopy;  Laterality: N/A;  1245   HYSTEROSCOPY N/A 03/22/2020   Procedure: HYSTEROSCOPY WITH MINERVA ENDOMETRIAL ABLATION;  Surgeon: Florian Buff, MD;  Location: [facility];  Service: Gynecology;  Laterality: N/A;   SAVORY DILATION N/A 06/27/2016   Procedure: SAVORY DILATION;  Surgeon: Danie Binder, MD;  Location: [facility];  Service: Endoscopy;  Laterality: N/A;   TUBAL LIGATION  1997   Tubal reversal  02/2015   [facility] - Dr Karie Kirks    SOCIAL HISTORY: Social History   Socioeconomic History   Marital status: Divorced    Spouse name: Not on file   Number of children: 2   Years of education: 8th   Highest education level: Not on file  Occupational History   Occupation: N/A  Tobacco Use   Smoking status: Former    Years: 1.00    Types: Cigarettes    Quit date: 12/30/1994    Years since quitting: 26.6   Smokeless tobacco: Never   Tobacco comments:    2-3 daily  Vaping Use   Vaping Use: Never used  Substance and Sexual Activity   Alcohol use: No   Drug use: No   Sexual activity: Yes    Birth control/protection: None    Comment: tubal reversed  Other Topics Concern   Not on file  Social History Narrative   Not on file   Social Determinants of Health   Financial Resource Strain: Low Risk    Difficulty of Paying Living Expenses: Not very hard  Food Insecurity: No Food Insecurity   Worried About Charity fundraiser in the Last Year: Never true   Ran Out of Food in the Last Year: Never true  Transportation Needs: No Transportation Needs   Lack of Transportation (Medical): No   Lack of Transportation  (Non-Medical): No  Physical Activity: Sufficiently Active   Days of Exercise per Week: 7 days   Minutes of Exercise per Session: 30 min  Stress: Not on file  Social Connections: Socially Isolated   Frequency of Communication with Friends and Family: More than three times a week   Frequency of Social Gatherings with Friends and Family: More than three times a week   Attends Religious Services: Never   Marine scientist or Organizations: No   Attends Music therapist: Never   Marital Status: Divorced  Human resources officer Violence: Not At Risk   Fear of Current or Ex-Partner: No   Emotionally Abused: No   Physically Abused: No   Sexually Abused: No    FAMILY HISTORY: Family History  Problem Relation Age of Onset   Depression Mother    Hypertension Mother    Other Mother 29       open heart surgery   Hypertension Father    Anxiety disorder Father    Heart attack Father 97   Peripheral vascular disease Father 6       CEA   Stroke  Maternal Grandmother    Colon cancer Neg Hx     ALLERGIES:  is allergic to ivp dye [iodinated diagnostic agents], shellfish allergy, mucinex dm [dm-guaifenesin er], aspirin, and [facility] instant hand sanitizer [ethyl alcohol (skin cleanser)].  MEDICATIONS:  Current Outpatient Medications  Medication Sig Dispense Refill   acetaminophen (TYLENOL) 500 MG tablet Take 500 mg by mouth every 6 (six) hours as needed.     amoxicillin (AMOXIL) 500 MG capsule Take 1 capsule (500 mg total) by mouth 3 (three) times daily. 21 capsule 0   DULoxetine (CYMBALTA) 60 MG capsule Take 2 capsules (120 mg total) by mouth daily. 180 capsule 1   linaclotide (LINZESS) 290 MCG CAPS capsule Take 1 capsule (290 mcg total) by mouth daily. To regulate bowel movements 90 capsule 1   Magnesium 400 MG TABS Take 400 mg by mouth every evening. 90 tablet 3   pantoprazole (PROTONIX) 40 MG tablet Take 1 tablet (40 mg total) by mouth daily. For stomach 30 tablet 11   No current  facility-administered medications for this visit.    PHYSICAL EXAMINATION: ECOG PERFORMANCE STATUS: 0 - Asymptomatic  Vitals:   08/24/21 1001  BP: 121/79  Pulse: 97  Resp: 16  Temp: 98.4 F (36.9 C)  SpO2: 100%   Filed Weights   08/24/21 1001  Weight: 128 lb 14.4 oz (58.5 kg)    GENERAL:alert, no distress and comfortable SKIN: skin color, texture, turgor are normal, no rashes or significant lesions EYES: normal, conjunctiva are pink and non-injected, sclera clear OROPHARYNX:no exudate, no erythema and lips, buccal mucosa, and tongue normal  NECK: supple, thyroid normal size, non-tender, without nodularity LYMPH:  no palpable lymphadenopathy in the cervical, axillary or inguinal LUNGS: clear to auscultation and percussion with normal breathing effort HEART: regular rate & rhythm and no murmurs and no lower extremity edema ABDOMEN:abdomen soft, non-tender and normal bowel sounds Musculoskeletal:no cyanosis of digits and no clubbing  PSYCH: alert & oriented x 3 with fluent speech NEURO: no focal motor/sensory deficits  LABORATORY DATA:  I have reviewed the data as listed Lab Results  Component Value Date   WBC 6.8 08/17/2021   HGB 14.2 08/17/2021   HCT 41.7 08/17/2021   MCV 95 08/17/2021   PLT 332 08/17/2021     Chemistry      Component Value Date/Time   NA 136 08/17/2021 1549   K 5.1 08/17/2021 1549   CL 101 08/17/2021 1549   CO2 23 08/17/2021 1549   BUN 9 08/17/2021 1549   CREATININE 0.75 08/17/2021 1549      Component Value Date/Time   CALCIUM 9.8 08/17/2021 1549   ALKPHOS 84 08/17/2021 1549   AST 17 08/17/2021 1549   ALT 9 08/17/2021 1549   BILITOT 0.3 08/17/2021 1549     I reviewed labs from her recent visit.  She does not have any evidence of anemia.  Ferritin is 16 at the lower limit of normal.  White blood cell count and platelet count normal.  Normal renal function.  RADIOGRAPHIC STUDIES: I have personally reviewed the radiological images as  listed and agreed with the findings in the report. No results found.  All questions were answered. The patient knows to call the clinic with any problems, questions or concerns. I spent 30 minutes in the care of this patient including H and P, review of records, counseling and coordination of care.     Benay Pike, MD 08/24/2021 10:39 AM

## 2021-09-07 ENCOUNTER — Ambulatory Visit (HOSPITAL_COMMUNITY): Payer: Medicaid Other | Admitting: Hematology and Oncology

## 2021-10-12 ENCOUNTER — Inpatient Hospital Stay (HOSPITAL_COMMUNITY): Payer: Medicaid Other | Attending: Hematology

## 2021-10-19 ENCOUNTER — Ambulatory Visit (HOSPITAL_COMMUNITY): Payer: Medicaid Other | Admitting: Physician Assistant

## 2021-12-07 ENCOUNTER — Telehealth: Payer: Self-pay | Admitting: Family Medicine

## 2021-12-12 ENCOUNTER — Ambulatory Visit: Payer: Medicaid Other | Admitting: Family Medicine

## 2022-02-25 ENCOUNTER — Telehealth: Payer: Self-pay | Admitting: Family Medicine

## 2022-02-25 DIAGNOSIS — F411 Generalized anxiety disorder: Secondary | ICD-10-CM

## 2022-02-25 MED ORDER — DULOXETINE HCL 60 MG PO CPEP: 120.0000 mg | ORAL_CAPSULE | Freq: Every day | ORAL | 0 refills | Status: DC

## 2022-02-25 NOTE — Telephone Encounter (Signed)
Left message 30day supply sent

## 2022-03-12 ENCOUNTER — Encounter: Payer: Self-pay | Admitting: Family Medicine

## 2022-03-12 ENCOUNTER — Ambulatory Visit (INDEPENDENT_AMBULATORY_CARE_PROVIDER_SITE_OTHER): Payer: Medicaid Other | Admitting: Family Medicine

## 2022-03-12 VITALS — BP 118/80 | HR 97 | Temp 97.5°F | Ht 62.0 in | Wt 133.6 lb

## 2022-03-12 DIAGNOSIS — F339 Major depressive disorder, recurrent, unspecified: Secondary | ICD-10-CM | POA: Diagnosis not present

## 2022-03-12 DIAGNOSIS — R7989 Other specified abnormal findings of blood chemistry: Secondary | ICD-10-CM | POA: Diagnosis not present

## 2022-03-12 DIAGNOSIS — F411 Generalized anxiety disorder: Secondary | ICD-10-CM

## 2022-03-12 DIAGNOSIS — R140 Abdominal distension (gaseous): Secondary | ICD-10-CM | POA: Diagnosis not present

## 2022-03-12 DIAGNOSIS — K5904 Chronic idiopathic constipation: Secondary | ICD-10-CM | POA: Diagnosis not present

## 2022-03-12 DIAGNOSIS — K2100 Gastro-esophageal reflux disease with esophagitis, without bleeding: Secondary | ICD-10-CM | POA: Diagnosis not present

## 2022-03-12 MED ORDER — DULOXETINE HCL 60 MG PO CPEP: 120.0000 mg | ORAL_CAPSULE | Freq: Every day | ORAL | 3 refills | Status: DC

## 2022-03-12 MED ORDER — QUETIAPINE FUMARATE 25 MG PO TABS: 25.0000 mg | ORAL_TABLET | Freq: Every day | ORAL | 1 refills | Status: DC

## 2022-03-12 MED ORDER — ESOMEPRAZOLE MAGNESIUM 40 MG PO CPDR: 40.0000 mg | DELAYED_RELEASE_CAPSULE | Freq: Every day | ORAL | 3 refills | Status: DC

## 2022-03-12 MED ORDER — LUBIPROSTONE 24 MCG PO CAPS: 24.0000 ug | ORAL_CAPSULE | Freq: Two times a day (BID) | ORAL | 2 refills | Status: DC

## 2022-03-12 NOTE — Progress Notes (Signed)
? ?Subjective:  ?Patient ID: Laurie Howell, female    DOB: [birth year]  Age: 46 y.o. MRN: [id number] ? ?CC: Medical Management of Chronic Issues ? ?Four deaths of friends and family ?HPI ?[name] presents for early satiety and abd. Feels tight. Swelling. "8-9 ms pregnant." A lot of gas and burning in the chest all the time. Stopped taking pantoprazole it didn't help. Constipation - can go as long as 3 weeks between bowel movements. Linzess didn't help. ? ?Depression screen [facility] 2/9 03/12/2022 03/12/2022 08/24/2021  ?Decreased Interest 2 0 0  ?Down, Depressed, Hopeless 3 0 0  ?PHQ - 2 Score 5 0 0  ?Altered sleeping 3 - -  ?Tired, decreased energy 3 - -  ?Change in appetite 3 - -  ?Feeling bad or failure about yourself  2 - -  ?Trouble concentrating 2 - -  ?Moving slowly or fidgety/restless 0 - -  ?Suicidal thoughts 0 - -  ?PHQ-9 Score 18 - -  ?Difficult doing work/chores Very difficult - -  ?Some recent data might be hidden  ? ?GAD 7 : Generalized Anxiety Score 03/12/2022 04/10/2021 03/28/2020 08/13/2019  ?Nervous, Anxious, on Edge 3 0 0 3  ?Control/stop worrying 3 0 0 3  ?Worry too much - different things 3 1 0 3  ?Trouble relaxing 2 1 0 1  ?Restless 0 0 0 0  ?Easily annoyed or irritable 3 1 0 3  ?Afraid - awful might happen 0 0 0 0  ?Total GAD 7 Score 14 3 0 13  ?Anxiety Difficulty Somewhat difficult Somewhat difficult - -  ? ? ? 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  ? ?She has a past surgical history that includes Tubal  ligation (1997); Tubal reversal (02/2015); Esophagogastroduodenoscopy (N/A, 06/27/2016); Savory dilation (N/A, 06/27/2016); and Hysteroscopy (N/A, 03/22/2020).  ? ?Her family history includes Anxiety disorder in her father; Depression in her mother; Heart attack (age of onset: 45) in her father; Hypertension in her father and mother; Other (age of onset: 15) in her mother; Peripheral vascular disease (age of onset: 3) in her father; Stroke in her maternal grandmother.She reports that she quit smoking about 27 years ago. Her smoking use included cigarettes. She has never used smokeless tobacco. She reports that she does not drink alcohol and does not use drugs. ? ? ? ?ROS ?Review of Systems  ?Constitutional: Negative.   ?HENT: Negative.    ?Eyes:  Negative for visual disturbance.  ?Respiratory:  Negative for shortness of breath.   ?Cardiovascular:  Negative for chest pain.  ?Gastrointestinal:  Positive for abdominal distention, abdominal pain, constipation and nausea. Negative for blood in stool, rectal pain and vomiting.  ?Musculoskeletal:  Negative for arthralgias.  ?Psychiatric/Behavioral:  Positive for dysphoric mood. The patient is nervous/anxious.   ? ?Objective:  ?BP 118/80   Pulse 97   Temp (!) 97.5 ?F (36.4 ?C)   Ht '5\' 2"'  (1.575 m)   Wt 133 lb 9.6 oz (60.6 kg)   SpO2 99%   BMI 24.44 kg/m?  ? ?BP Readings from Last 3 Encounters:  ?03/12/22 118/80  ?08/24/21 121/79  ?08/17/21 116/76  ? ? ?Wt Readings from Last 3 Encounters:  ?  03/12/22 133 lb 9.6 oz (60.6 kg)  ?08/24/21 128 lb 14.4 oz (58.5 kg)  ?08/17/21 127 lb 3.2 oz (57.7 kg)  ? ? ? ?Physical Exam ?Constitutional:   ?   General: She is not in acute distress. ?   Appearance: She is well-developed.  ?HENT:  ?   Head: Normocephalic and atraumatic.  ?Eyes:  ?   Conjunctiva/sclera: Conjunctivae normal.  ?   Pupils: Pupils are equal, round, and reactive to light.  ?Neck:  ?   Thyroid: No thyromegaly.  ?Cardiovascular:  ?   Rate and Rhythm: Normal rate and  regular rhythm.  ?   Heart sounds: Normal heart sounds. No murmur heard. ?Pulmonary:  ?   Effort: Pulmonary effort is normal. No respiratory distress.  ?   Breath sounds: Normal breath sounds. No wheezing or rales.  ?Abdominal:  ?   General: Bowel sounds are normal. There is no distension.  ?   Palpations: Abdomen is soft.  ?   Tenderness: There is no abdominal tenderness.  ?Musculoskeletal:     ?   General: Normal range of motion.  ?   Cervical back: Normal range of motion and neck supple.  ?Lymphadenopathy:  ?   Cervical: No cervical adenopathy.  ?Skin: ?   General: Skin is warm and dry.  ?Neurological:  ?   Mental Status: She is alert and oriented to person, place, and time.  ?Psychiatric:     ?   Behavior: Behavior normal.     ?   Thought Content: Thought content normal.     ?   Judgment: Judgment normal.  ? ? ? ? ?Assessment & Plan:  ? ?Genesi was seen today for medical management of chronic issues. ? ?Diagnoses and all orders for this visit: ? ?Depression, recurrent ([place]) ?-     Anemia Profile B ?-     CMP14+EGFR ?-     TSH + free T4 ? ?GAD (generalized anxiety disorder) ?-     DULoxetine (CYMBALTA) 60 MG capsule; Take 2 capsules (120 mg total) by mouth daily. ?-     Anemia Profile B ?-     CMP14+EGFR ?-     TSH + free T4 ? ?Abnormal thyroid blood test ?-     Anemia Profile B ?-     CMP14+EGFR ?-     TSH + free T4 ? ?Abdominal bloating ?-     Ambulatory referral to Gastroenterology ? ?Chronic idiopathic constipation ?-     Ambulatory referral to Gastroenterology ? ?Gastroesophageal reflux disease with esophagitis without hemorrhage ?-     Ambulatory referral to Gastroenterology ? ?Other orders ?-     QUEtiapine (SEROQUEL) 25 MG tablet; Take 1 tablet (25 mg total) by mouth at bedtime. ?-     esomeprazole (NEXIUM) 40 MG capsule; Take 1 capsule (40 mg total) by mouth daily. ?-     lubiprostone (AMITIZA) 24 MCG capsule; Take 1 capsule (24 mcg total) by mouth 2 (two) times daily with a meal. ? ? ? ? ? ? ?I have  discontinued Jaidon S. Scheirer's acetaminophen, Magnesium, pantoprazole, linaclotide, and amoxicillin. I am also having her start on QUEtiapine, esomeprazole, and lubiprostone. Additionally, I am having her maintain her diphenhydramine-acetaminophen and DULoxetine. ? ?Allergies as of 03/12/2022   ? ?   Reactions  ? Ivp Dye [iodinated Contrast Media] Anaphylaxis  ? Shellfish Allergy Anaphylaxis  ? Mucinex Dm [dm-guaifenesin Er] Hives, Itching, Nausea And Vomiting  ? Aspirin Swelling, Other (See Comments)  ?  Reaction:  Facial swelling   ? [employer] Instant Hand Sanitizer [ethyl Alcohol (skin Cleanser)] Hives  ? Body feels like it is on fire  ? ?  ? ?  ?Medication List  ?  ? ?  ? Accurate as of March 12, 2022  2:43 PM. If you have any questions, ask your nurse or doctor.  ?  ?  ? ?  ? ?STOP taking these medications   ? ?acetaminophen 500 MG tablet ?Commonly known as: TYLENOL ?Stopped by: Claretta Fraise, MD ?  ?amoxicillin 500 MG capsule ?Commonly known as: AMOXIL ?Stopped by: Claretta Fraise, MD ?  ?linaclotide 290 MCG Caps capsule ?Commonly known as: Linzess ?Stopped by: Claretta Fraise, MD ?  ?Magnesium 400 MG Tabs ?Stopped by: Claretta Fraise, MD ?  ?pantoprazole 40 MG tablet ?Commonly known as: PROTONIX ?Stopped by: Claretta Fraise, MD ?  ? ?  ? ?TAKE these medications   ? ?diphenhydramine-acetaminophen 25-500 MG Tabs tablet ?Commonly known as: TYLENOL PM ?Take 1 tablet by mouth at bedtime as needed. ?  ?DULoxetine 60 MG capsule ?Commonly known as: CYMBALTA ?Take 2 capsules (120 mg total) by mouth daily. ?  ?esomeprazole 40 MG capsule ?Commonly known as: NexIUM ?Take 1 capsule (40 mg total) by mouth daily. ?Started by: Claretta Fraise, MD ?  ?lubiprostone 24 MCG capsule ?Commonly known as: AMITIZA ?Take 1 capsule (24 mcg total) by mouth 2 (two) times daily with a meal. ?Started by: Claretta Fraise, MD ?  ?QUEtiapine 25 MG tablet ?Commonly known as: SEROQUEL ?Take 1 tablet (25 mg total) by mouth at bedtime. ?Started by: Claretta Fraise,  MD ?  ? ?  ? ? ? ?Follow-up: Return in about 6 months (around 09/12/2022). ? ?Claretta Fraise, M.D. ?

## 2022-03-13 ENCOUNTER — Other Ambulatory Visit: Payer: Self-pay

## 2022-03-13 LAB — CMP14+EGFR
ALT: 10 IU/L (ref 0–32)
AST: 16 IU/L (ref 0–40)
Albumin/Globulin Ratio: 1.4 (ref 1.2–2.2)
Albumin: 4.3 g/dL (ref 3.8–4.8)
Alkaline Phosphatase: 94 IU/L (ref 44–121)
BUN/Creatinine Ratio: 11 (ref 9–23)
BUN: 8 mg/dL (ref 6–24)
Bilirubin Total: 0.3 mg/dL (ref 0.0–1.2)
CO2: 23 mmol/L (ref 20–29)
Calcium: 9.7 mg/dL (ref 8.7–10.2)
Chloride: 101 mmol/L (ref 96–106)
Creatinine, Ser: 0.73 mg/dL (ref 0.57–1.00)
Globulin, Total: 3 g/dL (ref 1.5–4.5)
Glucose: 89 mg/dL (ref 70–99)
Potassium: 5 mmol/L (ref 3.5–5.2)
Sodium: 137 mmol/L (ref 134–144)
Total Protein: 7.3 g/dL (ref 6.0–8.5)
eGFR: 103 mL/min/{1.73_m2} (ref >59)

## 2022-03-13 LAB — ANEMIA PROFILE B
Basophils Absolute: 0.1 10*3/uL (ref 0.0–0.2)
Basos: 1 %
EOS (ABSOLUTE): 0.1 10*3/uL (ref 0.0–0.4)
Eos: 1 %
Ferritin: 11 ng/mL — ABNORMAL LOW (ref 15–150)
Folate: 3.6 ng/mL (ref >3.0)
Hematocrit: 39 % (ref 34.0–46.6)
Hemoglobin: 12.8 g/dL (ref 11.1–15.9)
Immature Grans (Abs): 0 10*3/uL (ref 0.0–0.1)
Immature Granulocytes: 0 %
Iron Saturation: 8 % — CL (ref 15–55)
Iron: 34 ug/dL (ref 27–159)
Lymphocytes Absolute: 1.5 10*3/uL (ref 0.7–3.1)
Lymphs: 25 %
MCH: 28.8 pg (ref 26.6–33.0)
MCHC: 32.8 g/dL (ref 31.5–35.7)
MCV: 88 fL (ref 79–97)
Monocytes Absolute: 0.4 10*3/uL (ref 0.1–0.9)
Monocytes: 7 %
Neutrophils Absolute: 3.8 10*3/uL (ref 1.4–7.0)
Neutrophils: 66 %
Platelets: 367 10*3/uL (ref 150–450)
RBC: 4.44 x10E6/uL (ref 3.77–5.28)
RDW: 14.5 % (ref 11.7–15.4)
Retic Ct Pct: 1 % (ref 0.6–2.6)
Total Iron Binding Capacity: 428 ug/dL (ref 250–450)
UIBC: 394 ug/dL (ref 131–425)
Vitamin B-12: 540 pg/mL (ref 232–1245)
WBC: 5.8 10*3/uL (ref 3.4–10.8)

## 2022-03-13 LAB — TSH+FREE T4
Free T4: 1.02 ng/dL (ref 0.82–1.77)
TSH: 0.239 u[IU]/mL — ABNORMAL LOW (ref 0.450–4.500)

## 2022-03-14 ENCOUNTER — Telehealth: Payer: Self-pay

## 2022-03-14 ENCOUNTER — Telehealth: Payer: Self-pay | Admitting: Family Medicine

## 2022-03-14 ENCOUNTER — Other Ambulatory Visit: Payer: Self-pay | Admitting: Family Medicine

## 2022-03-14 MED ORDER — QUETIAPINE FUMARATE 25 MG PO TABS: 25.0000 mg | ORAL_TABLET | Freq: Every day | ORAL | 1 refills | Status: DC

## 2022-03-14 NOTE — Telephone Encounter (Signed)
States provider was going to start something new for nerves but pharmacy didn't have it.  ?

## 2022-03-14 NOTE — Telephone Encounter (Signed)
I sent quetiapine for her. P[facility]armacy apparently lost it because it was sent. I will resend now. ?

## 2022-03-14 NOTE — Telephone Encounter (Signed)
PA started by pharmacy. PA not needed.  ?

## 2022-03-14 NOTE — Telephone Encounter (Signed)
Patient is calling to check on her prescription for nerve medicine.  Says it was not called into the pharmacy. ? ?[facility] ?

## 2022-03-15 NOTE — Telephone Encounter (Signed)
Patient aware and verbalized understanding. °

## 2022-03-19 ENCOUNTER — Telehealth: Payer: Self-pay | Admitting: *Deleted

## 2022-03-19 NOTE — Telephone Encounter (Signed)
Key: [facility] - [place] Case ID: [id number] - Rx #: [id number] ? ?Approvedtoday ?PA Case: [id number], Status: Approved, Coverage Starts on: 03/19/2022 12:00:00 AM, Coverage Ends on: 03/19/2023 12:00:00 AM. ?

## 2022-04-02 NOTE — Progress Notes (Deleted)
? ?Referring Provider: Claretta Fraise, MD ?Primary Care Physician:  Claretta Fraise, MD ?Primary Gastroenterologist:  Dr. Abbey Chatters ? ?No chief complaint on file. ? ? ?HPI:   ?Laurie Howell is a 46 y.o. female with history of anxiety, GERD, dysphagia with dilation in 2017, iron deficiency with menorrhagia s/p endometrial ablation in 2021, more recently with recurrent iron deficiency without anemia referred to hematology in October and recommended oral iron and age appropriate cancer screening, presenting today at the request of Dr. Livia Snellen for GERD, constipation, abdominal bloating.  We have not seen patient since 2017. ? ?Reviewed recent office visit with Dr. Livia Snellen on 03/12/2022.  She reported early satiety, abdominal distention with tight sensation, increase gas, burning in the chest all the time.  She stopped taking pantoprazole as she stated it did not help.  She was going as long as 3 weeks without a bowel movement.  Reported Linzess was not helpful.  She was referred to GI for further evaluation, started on Nexium 40 mg daily, Amitiza 24 mcg twice daily, and labs were also updated. ? ?Reviewed labs completed 3/14.  Hemoglobin 12.8, iron saturation 8%, ferritin 11, total iron 34.  CMP within normal limits.  TSH 0.239, free T4 1.02.  She was advised to take ferrous fumarate 324 mg twice daily x6 months. ? ?Today:  ? ? ?Past Medical History:  ?Diagnosis Date  ? Anemia   ? Anovulation 01/23/2017  ? Chronic gastritis   ? DUB (dysfunctional uterine bleeding) 06/03/2017  ? Esophageal stricture   ? Fibroid 06/18/2017  ? Frequent UTI   ? GERD (gastroesophageal reflux disease)   ? Headache   ? 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 BTL before age 39 , tubal reanastomosis 03/06/2015 in [place]   ? 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 Iron deficiency anemia 03/28/2016  ? due to menorrhagia  ? LLQ pain 06/03/2017  ? Menorrhagia with irregular cycle 01/13/2020  ? Menorrhagia with regular cycle 06/03/2017  ? Ovarian cyst   ?  Pelvic pain 01/13/2020  ? ? ?Past Surgical History:  ?Procedure Laterality Date  ? ESOPHAGOGASTRODUODENOSCOPY N/A 06/27/2016  ? Procedure: ESOPHAGOGASTRODUODENOSCOPY (EGD);  Surgeon: Danie Binder, MD;  [facility]: [facility];  Service: Endoscopy;  Laterality: N/A;  1245  ? HYSTEROSCOPY N/A 03/22/2020  ? Procedure: HYSTEROSCOPY WITH MINERVA ENDOMETRIAL ABLATION;  Surgeon: Florian Buff, MD;  Location: [facility];  Service: Gynecology;  Laterality: N/A;  ? SAVORY DILATION N/A 06/27/2016  ? Procedure: SAVORY DILATION;  Surgeon: Danie Binder, MD;  Location: [facility];  Service: Endoscopy;  Laterality: N/A;  ? TUBAL LIGATION  1997  ? Tubal reversal  02/2015  ? [facility] - Dr Karie Kirks  ? ? ?Current Outpatient Medications  ?Medication Sig Dispense Refill  ? diphenhydramine-acetaminophen (TYLENOL PM) 25-500 MG TABS tablet Take 1 tablet by mouth at bedtime as needed.    ? DULoxetine (CYMBALTA) 60 MG capsule Take 2 capsules (120 mg total) by mouth daily. 180 capsule 3  ? esomeprazole (NEXIUM) 40 MG capsule Take 1 capsule (40 mg total) by mouth daily. 30 capsule 3  ? lubiprostone (AMITIZA) 24 MCG capsule Take 1 capsule (24 mcg total) by mouth 2 (two) times daily with a meal. 60 capsule 2  ? QUEtiapine (SEROQUEL) 25 MG tablet Take 1 tablet (25 mg total) by mouth at bedtime. 90 tablet 1  ? ?No current facility-administered medications for this visit.  ? ? ?Allergies as of 04/04/2022 - Review Complete 03/12/2022  ?Allergen Reaction  Noted  ? Ivp dye [iodinated contrast media] Anaphylaxis 07/09/2013  ? Shellfish allergy Anaphylaxis 07/09/2013  ? Mucinex dm [dm-guaifenesin er] Hives, Itching, and Nausea And Vomiting 04/10/2021  ? Aspirin Swelling and Other (See Comments) 07/09/2013  ? [facility] instant hand sanitizer [ethyl alcohol (skin cleanser)] Hives 08/23/2021  ? ? ?Family History  ?Problem Relation Age of Onset  ? Depression Mother   ? Hypertension Mother   ? Other Mother 39  ?     open heart surgery  ? Hypertension Father   ? Anxiety  disorder Father   ? Heart attack Father 84  ? Peripheral vascular disease Father 60  ?     CEA  ? Stroke Maternal Grandmother   ? Colon cancer Neg Hx   ? ? ?Social History  ? ?Socioeconomic History  ? Marital status: Divorced  ?  Spouse name: Not on file  ? Number of children: 2  ? Years of education: 8th  ? Highest education level: Not on file  ?Occupational History  ? Occupation: N/A  ?Tobacco Use  ? Smoking status: Former  ?  Years: 1.00  ?  Types: Cigarettes  ?  Quit date: 12/30/1994  ?  Years since quitting: 27.2  ? Smokeless tobacco: Never  ? Tobacco comments:  ?  2-3 daily  ?Vaping Use  ? Vaping Use: Never used  ?Substance and Sexual Activity  ? Alcohol use: No  ? Drug use: No  ? Sexual activity: Yes  ?  Birth control/protection: None  ?  Comment: tubal reversed  ?Other Topics Concern  ? Not on file  ?Social History Narrative  ? Not on file  ? ?Social Determinants of Health  ? ?Financial Resource Strain: Low Risk   ? Difficulty of Paying Living Expenses: Not very hard  ?Food Insecurity: No Food Insecurity  ? Worried About Charity fundraiser in the Last Year: Never true  ? Ran Out of Food in the Last Year: Never true  ?Transportation Needs: No Transportation Needs  ? Lack of Transportation (Medical): No  ? Lack of Transportation (Non-Medical): No  ?Physical Activity: Sufficiently Active  ? Days of Exercise per Week: 7 days  ? Minutes of Exercise per Session: 30 min  ?Stress: Not on file  ?Social Connections: Socially Isolated  ? Frequency of Communication with Friends and Family: More than three times a week  ? Frequency of Social Gatherings with Friends and Family: More than three times a week  ? Attends Religious Services: Never  ? Active Member of Clubs or Organizations: No  ? Attends Archivist Meetings: Never  ? Marital Status: Divorced  ?Intimate Partner Violence: Not At Risk  ? Fear of Current or Ex-Partner: No  ? Emotionally Abused: No  ? Physically Abused: No  ? Sexually Abused: No   ? ? ?Review of Systems: ?Gen: Denies any fever, chills, fatigue, weight loss, lack of appetite.  ?CV: Denies chest pain, heart palpitations, peripheral edema, syncope.  ?Resp: Denies shortness of breath at rest or with exertion. Denies wheezing or cough.  ?GI: Denies dysphagia or odynophagia. Denies jaundice, hematemesis, fecal incontinence. ?GU : Denies urinary burning, urinary frequency, urinary hesitancy ?MS: Denies joint pain, muscle weakness, cramps, or limitation of movement.  ?Derm: Denies rash, itching, dry skin ?Psych: Denies depression, anxiety, memory loss, and confusion ?Heme: Denies bruising, bleeding, and enlarged lymph nodes. ? ?Physical Exam: ?There were no vitals taken for this visit. ?General:   Alert and oriented. Pleasant and cooperative. Well-nourished and  well-developed.  ?Head:  Normocephalic and atraumatic. ?Eyes:  Without icterus, sclera clear and conjunctiva pink.  ?Ears:  Normal auditory acuity. ?Lungs:  Clear to auscultation bilaterally. No wheezes, rales, or rhonchi. No distress.  ?Heart:  S1, S2 present without murmurs appreciated.  ?Abdomen:  +BS, soft, non-tender and non-distended. No HSM noted. No guarding or rebound. No masses appreciated.  ?Rectal:  Deferred  ?Msk:  Symmetrical without gross deformities. Normal posture. ?Extremities:  Without edema. ?Neurologic:  Alert and  oriented x4;  grossly normal neurologically. ?Skin:  Intact without significant lesions or rashes. ?Psych:  Alert and cooperative. Normal mood and affect. ? ? ? ?Assessment:  ? ? ? ?Plan:  ?*** ? ? ?Aliene Altes, PA-C ?[facility] ?04/04/2022 ?  ?

## 2022-04-04 ENCOUNTER — Ambulatory Visit: Payer: Medicaid Other | Admitting: Gastroenterology

## 2022-04-08 NOTE — Progress Notes (Signed)
? ? ?GI Office Note   ? ?Referring Provider: Claretta Fraise, MD ?Primary Care Physician:  Claretta Fraise, MD  ? ? ?Chief Complaint  ? ?Chief Complaint  ?Patient presents with  ? Abdominal Pain  ?  Bloating, constipation   ? ? ? ?History of Present Illness  ? ?Laurie Howell is a 46 y.o. female with a history of anxiety, depression, iron deficiency anemia, chronic gastritis, GERD, esophageal stricture, pelvic pain, headaches, uterine fibroids, chronic constipation, presenting today at the request of Claretta Fraise, MD for further evaluation of gas/bloating, constipation, and reflux.  ? ?Last EGD 06/27/16 - moderate gastritis, concern for eosinophilic esophagitis, s/p dilation (pathology with chronic gastritis and benign esophageal mucosa, advised to continue pantoprazole 40 mg daily) ? ?Last seen by family medicine on 03/12/22 - c/o early satiety and her abdomen feels tight like she is 8-9 months pregnant, having a lot of gas and burning in her chest as well as constipation (sometimes 3 weeks between bowel movements). Stopped taking pantoprazole due to it not helping. Previously on Linzess which is not helping. Dr. Livia Snellen d/c'd pantoprazole and linaclotide and started her on esomeprazole and lubiprostone.  ? ?Labs 03/12/22 - CBC normal, anemia panel with low ferritin and iron saturation (PCP advised OTC ferrous fumarate 324 mg BID). CMP normal, TSH low at 0.239, T4 1.02.  ? ? ?Today: ?GERD ?Patient complains of dyspepsia. Symptoms have been present for approximately several months. Symptoms include abdominal bloating, early satiety, heartburn, and burning in throat and chest . The patient denies bilious reflux, choking on food, cough, difficulty swallowing, dysphagia, hematemesis, hoarseness, melena, nausea, and unexpected weight loss. Symptoms appear to be worsened by carbonated beverages, large meals, and lying down. Risk factors present for GERD include caffeine use. Risk factors absent for GERD are tobacco abuse,  obesity, NSAID use, ASA use, and oral corticosteroid use. Studies performed so far include upper GI, result: negative for H. Pylori. Treatments tried so far include proton pump inhibitor: pantoprazole 40 mg daily. Results of treatment: no change in severity. Currently, the symptoms are moderate and occur approximately  every day . Recently switched to esomeprazole 40 mg daily with resolution of symptoms and no breakthrough.  ? ?Constipation ?Patient complains of constipation. Onset was  years ago . Patient has been having maybe 1 bowel movement every 3 - 4 weeks. Defecation has been difficult and incomplete. Co-Morbid conditions:neurologic disease, recent dehydration/illness, and stress. Symptoms have  been unchanged . Current Health Habits: Eating fiber? no, Exercise? no, Adequate hydration? no. Current over the counter/prescription laxative:  miralax and chocolate laxatives in the past  which has been not very effective. Denies melena or hematochezia. Has tried Linzess and recently Amitiza with no improvement at all. She will go somewhat with small loose bowel movements after trying chocolate laxatives. Unsure if she has tried colace, bisacodyl, or senna in the past and has not tried fiber supplementation. Is currently back on iron per PCP for chronic iron deficiency anemia. She stated she has previously needed IV iron infusions prior to her uterine ablation. Had tubes tied the untied - after tubes untied started having constipation. ? ?Gas/Bloating ?No family history of celiac disease. No N/V, lower abdominal pain with bloating, feels like a tightening labor pain but only lower in the abdomen. Does endorse some early satiety however no lack of appetite and no unexplained weight loss. Stomach feels full. Reports loud abdominal noises at times especially after meals. Has had to buy stretchy clothes. Better in the  mornings, worsens throughout the day.  ? ?Recently lost many family members, stayed with her aunt while  she passed away in hospice. Also reports ongoing insomnia since she found her father dead years ago.  ? ? ? ?Past Medical History:  ?Diagnosis Date  ? Anemia   ? Anovulation 01/23/2017  ? Chronic gastritis   ? DUB (dysfunctional uterine bleeding) 06/03/2017  ? Esophageal stricture   ? Fibroid 06/18/2017  ? Frequent UTI   ? GERD (gastroesophageal reflux disease)   ? Headache   ? 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 BTL before age 77 , tubal reanastomosis 03/06/2015 in [place]   ? 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 Iron deficiency anemia 03/28/2016  ? due to menorrhagia  ? LLQ pain 06/03/2017  ? Menorrhagia with irregular cycle 01/13/2020  ? Menorrhagia with regular cycle 06/03/2017  ? Ovarian cyst   ? Pelvic pain 01/13/2020  ? ? ?Past Surgical History:  ?Procedure Laterality Date  ? ESOPHAGOGASTRODUODENOSCOPY N/A 06/27/2016  ? Procedure: ESOPHAGOGASTRODUODENOSCOPY (EGD);  Surgeon: Danie Binder, MD;  [facility];  Service: Endoscopy;  Laterality: N/A;  1245  ? HYSTEROSCOPY N/A 03/22/2020  ? Procedure: HYSTEROSCOPY WITH MINERVA ENDOMETRIAL ABLATION;  Surgeon: Florian Buff, MD;  Location: [facility];  Service: Gynecology;  Laterality: N/A;  ? SAVORY DILATION N/A 06/27/2016  ? Procedure: SAVORY DILATION;  Surgeon: Danie Binder, MD;  [facility]: [facility];  Service: Endoscopy;  Laterality: N/A;  ? TUBAL LIGATION  1997  ? Tubal reversal  02/2015  ? [facility] - Dr Karie Kirks  ? ? ?Current Outpatient Medications  ?Medication Sig Dispense Refill  ? diphenhydramine-acetaminophen (TYLENOL PM) 25-500 MG TABS tablet Take 1 tablet by mouth at bedtime as needed.    ? DULoxetine (CYMBALTA) 60 MG capsule Take 2 capsules (120 mg total) by mouth daily. 180 capsule 3  ? esomeprazole (NEXIUM) 40 MG capsule Take 1 capsule (40 mg total) by mouth daily. 30 capsule 3  ? ferrous sulfate 324 MG TBEC Take 324 mg by mouth.    ? ?No current facility-administered medications for this visit.  ? ? ?Allergies as of 04/09/2022 - Review  Complete 04/09/2022  ?Allergen Reaction Noted  ? Ivp dye [iodinated contrast media] Anaphylaxis 07/09/2013  ? Shellfish allergy Anaphylaxis 07/09/2013  ? Mucinex dm [dm-guaifenesin er] Hives, Itching, and Nausea And Vomiting 04/10/2021  ? Aspirin Swelling and Other (See Comments) 07/09/2013  ? [facility] instant hand sanitizer [ethyl alcohol (skin cleanser)] Hives 08/23/2021  ? ? ?Family History  ?Problem Relation Age of Onset  ? Depression Mother   ? Hypertension Mother   ? Other Mother 52  ?     open heart surgery  ? Hypertension Father   ? Anxiety disorder Father   ? Heart attack Father 39  ? Peripheral vascular disease Father 79  ?     CEA  ? Stroke Maternal Grandmother   ? Colon cancer Neg Hx   ? ? ?Social History  ? ?Socioeconomic History  ? Marital status: Divorced  ?  Spouse name: Not on file  ? Number of children: 2  ? Years of education: 8th  ? Highest education level: Not on file  ?Occupational History  ? Occupation: N/A  ?Tobacco Use  ? Smoking status: Former  ?  Years: 1.00  ?  Types: Cigarettes  ?  Quit date: 12/30/1994  ?  Years since quitting: 27.2  ? Smokeless tobacco: Never  ? Tobacco comments:  ?  2-3  daily  ?Vaping Use  ? Vaping Use: Never used  ?Substance and Sexual Activity  ? Alcohol use: No  ? Drug use: No  ? Sexual activity: Yes  ?  Birth control/protection: None  ?  Comment: tubal reversed  ?Other Topics Concern  ? Not on file  ?Social History Narrative  ? Not on file  ? ?Social Determinants of Health  ? ?Financial Resource Strain: Low Risk   ? Difficulty of Paying Living Expenses: Not very hard  ?Food Insecurity: No Food Insecurity  ? Worried About Charity fundraiser in the Last Year: Never true  ? Ran Out of Food in the Last Year: Never true  ?Transportation Needs: No Transportation Needs  ? Lack of Transportation (Medical): No  ? Lack of Transportation (Non-Medical): No  ?Physical Activity: Sufficiently Active  ? Days of Exercise per Week: 7 days  ? Minutes of Exercise per Session: 30 min   ?Stress: Not on file  ?Social Connections: Socially Isolated  ? Frequency of Communication with Friends and Family: More than three times a week  ? Frequency of Social Gatherings with Friends and Family: More t

## 2022-04-09 ENCOUNTER — Ambulatory Visit (INDEPENDENT_AMBULATORY_CARE_PROVIDER_SITE_OTHER): Payer: Medicaid Other | Admitting: Gastroenterology

## 2022-04-09 ENCOUNTER — Encounter: Payer: Self-pay | Admitting: Gastroenterology

## 2022-04-09 VITALS — BP 106/72 | HR 120 | Temp 98.1°F | Ht 62.0 in | Wt 137.0 lb

## 2022-04-09 DIAGNOSIS — K5904 Chronic idiopathic constipation: Secondary | ICD-10-CM | POA: Diagnosis not present

## 2022-04-09 DIAGNOSIS — K219 Gastro-esophageal reflux disease without esophagitis: Secondary | ICD-10-CM

## 2022-04-09 DIAGNOSIS — R140 Abdominal distension (gaseous): Secondary | ICD-10-CM | POA: Diagnosis not present

## 2022-04-09 MED ORDER — TRULANCE 3 MG PO TABS: 3.0000 mg | ORAL_TABLET | Freq: Every day | ORAL | 1 refills | Status: DC

## 2022-04-09 NOTE — Patient Instructions (Addendum)
I am ordering lab work to test for celiac disease. ? ?I am sending in Trulance 3 mg tablets for your. You will take 1 tablet daily. We will try this along with a more aggressive regimen for constipation. I want you to take MiraLAX 2 capfuls daily for one week along with the trulance. If this is still not adequate, then I would add on once daily Dulcolax (bisacodyl) tablet until you have had 3 adequate bowel movements. If you begin having more than 2 - 3 moderate size loose stools a day you may discontinue the bisacodyl and then decrease miralax to 1 capful daily. Sometimes you may experience abdominal cramping with bisacodyl, this is normal ?  ?I also recommend adding OTC Benefiber (2 teaspoons 1-3 times daily). Be sure to drink at least 4 to 6 glasses of water daily. Call me with a progress report in 1-2 weeks.  ? ?Avoid gas-producing foods (eg, cabbage, legumes, onions, broccoli, brussel sprouts, wheat, and potatoes). If this does not improve then need to follow a low FODMAP diet.  ? ?Continue esomeprazole daily.  ? ?Once your constipation is under better control we will discuss setting you up for screening colonoscopy.  ? ?It was a pleasure to meet you today.  ?

## 2022-04-10 LAB — IGA: Immunoglobulin A: 248 mg/dL (ref 47–310)

## 2022-04-10 LAB — TISSUE TRANSGLUTAMINASE, IGA: (tTG) Ab, IgA: <1 U/mL

## 2022-06-12 ENCOUNTER — Ambulatory Visit: Payer: Medicaid Other | Admitting: Family Medicine

## 2022-06-12 ENCOUNTER — Ambulatory Visit (INDEPENDENT_AMBULATORY_CARE_PROVIDER_SITE_OTHER): Payer: Medicaid Other

## 2022-06-12 ENCOUNTER — Encounter: Payer: Self-pay | Admitting: Family Medicine

## 2022-06-12 VITALS — BP 138/91 | HR 108 | Temp 98.0°F | Ht 62.0 in | Wt 136.6 lb

## 2022-06-12 DIAGNOSIS — D509 Iron deficiency anemia, unspecified: Secondary | ICD-10-CM

## 2022-06-12 DIAGNOSIS — M25571 Pain in right ankle and joints of right foot: Secondary | ICD-10-CM

## 2022-06-12 DIAGNOSIS — R55 Syncope and collapse: Secondary | ICD-10-CM | POA: Diagnosis not present

## 2022-06-12 DIAGNOSIS — M25561 Pain in right knee: Secondary | ICD-10-CM

## 2022-06-12 MED ORDER — DESVENLAFAXINE SUCCINATE ER 100 MG PO TB24: 100.0000 mg | ORAL_TABLET | Freq: Every day | ORAL | 0 refills | Status: DC

## 2022-06-12 MED ORDER — DICLOFENAC SODIUM 75 MG PO TBEC: 75.0000 mg | DELAYED_RELEASE_TABLET | Freq: Two times a day (BID) | ORAL | 0 refills | Status: AC

## 2022-06-12 NOTE — Progress Notes (Signed)
Subjective:  Patient ID: Laurie Howell, female    DOB: [birth year]  Age: 46 y.o. MRN: [id number]  CC: Fall   HPI Laurie Howell presents for passing out yesterday on steps. [place] [address] and hurt the right knee and ankle. Passing out happens occasionally. Occurring about every 8-9 months. Started when her iron started getting low.   No energy for 2 weeks. Feeling drained and weak.   Pt. Relates sx to panic. Gets palpitations at times. Feels the Cymbalta isn't helping. Still feels anxious and irritable.     06/12/2022    1:54 PM 03/12/2022    2:14 PM 03/12/2022    2:09 PM  Depression screen PHQ 2/9  Decreased Interest 0 2 0  Down, Depressed, Hopeless 0 3 0  PHQ - 2 Score 0 5 0  Altered sleeping  3   Tired, decreased energy  3   Change in appetite  3   Feeling bad or failure about yourself   2   Trouble concentrating  2   Moving slowly or fidgety/restless  0   Suicidal thoughts  0   PHQ-9 Score  18   Difficult doing work/chores  Very difficult     History Laurie Howell has a past medical history of Anemia, Anovulation (01/23/2017), Chronic gastritis, DUB (dysfunctional uterine bleeding) (06/03/2017), Esophageal stricture, Fibroid (06/18/2017), Frequent UTI, GERD (gastroesophageal reflux disease), Headache, 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   She has a past surgical history that includes Tubal ligation (1997); Tubal reversal (02/2015); Esophagogastroduodenoscopy (N/A, 06/27/2016); Savory dilation (N/A, 06/27/2016); and Hysteroscopy (N/A, 03/22/2020).   Her family history includes Anxiety disorder in her father; Depression in her mother; Heart attack (age of onset: 26) in her father; Hypertension in her father and mother; Other (age of onset: 49) in her mother;  Peripheral vascular disease (age of onset: 31) in her father; Stroke in her maternal grandmother.She reports that she quit smoking about 27 years ago. Her smoking use included cigarettes. She has never used smokeless tobacco. She reports that she does not drink alcohol and does not use drugs.    ROS Review of Systems  Constitutional:  Positive for fatigue.  HENT: Negative.    Eyes:  Negative for visual disturbance.  Respiratory:  Negative for shortness of breath.   Cardiovascular:  Negative for chest pain.  Gastrointestinal:  Negative for abdominal pain.  Musculoskeletal:  Positive for arthralgias.    Objective:  BP (!) 138/91   Pulse (!) 108   Temp 98 F (36.7 C)   Ht '5\' 2"'$  (1.575 m)   Wt 136 lb 9.6 oz (62 kg)   SpO2 94%   BMI 24.98 kg/m   BP Readings from Last 3 Encounters:  06/12/22 (!) 138/91  04/09/22 106/72  03/12/22 118/80    Wt Readings from Last 3 Encounters:  06/12/22 136 lb 9.6 oz (62 kg)  04/09/22 137 lb (62.1 kg)  03/12/22 133 lb 9.6 oz (60.6 kg)     Physical Exam Constitutional:      General: She is not in acute distress.    Appearance: She is well-developed.  Cardiovascular:     Rate and Rhythm: Normal rate and regular rhythm.  Pulmonary:     Breath sounds: Normal breath sounds.  Musculoskeletal:        General: Tenderness (anterior joint line right knee. Contusion  noted. Also at lateral malleolus.) present. No deformity. Normal range of motion.     Comments: Knee and ankle stable for drawer. Knee table for McMurray and collateral stress maneuvers  Skin:    General: Skin is warm and dry.  Neurological:     Mental Status: She is alert and oriented to person, place, and time.     Knee , Ankle XR - no fracture, dislocation noted  Assessment & Plan:   Laurie Howell was seen today for fall.  Diagnoses and all orders for this visit:  Syncope, unspecified syncope type -     TSH  Iron deficiency anemia, unspecified iron deficiency anemia type -      CBC with Differential/Platelet -     Iron, TIBC and Ferritin Panel -     TSH  Acute pain of right knee -     DG Knee 1-2 Views Right; Future  Acute right ankle pain -     DG Ankle Complete Right; Future  Other orders -     desvenlafaxine (PRISTIQ) 100 MG 24 hr tablet; Take 1 tablet (100 mg total) by mouth daily. -     diclofenac (VOLTAREN) 75 MG EC tablet; Take 1 tablet (75 mg total) by mouth 2 (two) times daily for 14 days. For muscle and  Joint pain       I have discontinued Laurie Howell's DULoxetine. I am also having her start on desvenlafaxine and diclofenac. Additionally, I am having her maintain her diphenhydramine-acetaminophen, esomeprazole, ferrous sulfate, and Trulance.  Allergies as of 06/12/2022       Reactions   Ivp Dye [iodinated Contrast Media] Anaphylaxis   Shellfish Allergy Anaphylaxis   Mucinex Dm [dm-guaifenesin Er] Hives, Itching, Nausea And Vomiting   Aspirin Swelling, Other (See Comments)   Reaction:  Facial swelling    [facility] Instant Hand Sanitizer [flavoring Agent] Hives   Body feels like it is on fire        Medication List        Accurate as of June 12, 2022  4:24 PM. If you have any questions, ask your nurse or doctor.          STOP taking these medications    DULoxetine 60 MG capsule Commonly known as: CYMBALTA Stopped by: Claretta Fraise, MD       TAKE these medications    desvenlafaxine 100 MG 24 hr tablet Commonly known as: Pristiq Take 1 tablet (100 mg total) by mouth daily. Started by: Claretta Fraise, MD   diclofenac 75 MG EC tablet Commonly known as: VOLTAREN Take 1 tablet (75 mg total) by mouth 2 (two) times daily for 14 days. For muscle and  Joint pain Started by: Claretta Fraise, MD   diphenhydramine-acetaminophen 25-500 MG Tabs tablet Commonly known as: TYLENOL PM Take 1 tablet by mouth at bedtime as needed.   esomeprazole 40 MG capsule Commonly known as: NexIUM Take 1 capsule (40 mg total) by mouth daily.    ferrous sulfate 324 MG Tbec Take 324 mg by mouth.   Trulance 3 MG Tabs Generic drug: Plecanatide Take 3 mg by mouth daily.         Follow-up: Return in about 1 month (around 07/12/2022), or if symptoms worsen or fail to improve.  Claretta Fraise, M.D.

## 2022-06-13 LAB — CBC WITH DIFFERENTIAL/PLATELET
Basophils Absolute: 0.1 10*3/uL (ref 0.0–0.2)
Basos: 1 %
EOS (ABSOLUTE): 0.1 10*3/uL (ref 0.0–0.4)
Eos: 1 %
Hematocrit: 41.5 % (ref 34.0–46.6)
Hemoglobin: 13.7 g/dL (ref 11.1–15.9)
Immature Grans (Abs): 0 10*3/uL (ref 0.0–0.1)
Immature Granulocytes: 0 %
Lymphocytes Absolute: 1.5 10*3/uL (ref 0.7–3.1)
Lymphs: 22 %
MCH: 29.7 pg (ref 26.6–33.0)
MCHC: 33 g/dL (ref 31.5–35.7)
MCV: 90 fL (ref 79–97)
Monocytes Absolute: 0.4 10*3/uL (ref 0.1–0.9)
Monocytes: 6 %
Neutrophils Absolute: 4.9 10*3/uL (ref 1.4–7.0)
Neutrophils: 70 %
Platelets: 362 10*3/uL (ref 150–450)
RBC: 4.61 x10E6/uL (ref 3.77–5.28)
RDW: 13.8 % (ref 11.7–15.4)
WBC: 6.9 10*3/uL (ref 3.4–10.8)

## 2022-06-13 LAB — IRON,TIBC AND FERRITIN PANEL
Ferritin: 17 ng/mL (ref 15–150)
Iron Saturation: 17 % (ref 15–55)
Iron: 71 ug/dL (ref 27–159)
Total Iron Binding Capacity: 423 ug/dL (ref 250–450)
UIBC: 352 ug/dL (ref 131–425)

## 2022-06-13 LAB — TSH: TSH: 0.029 u[IU]/mL — ABNORMAL LOW (ref 0.450–4.500)

## 2022-07-09 ENCOUNTER — Telehealth: Payer: Self-pay | Admitting: Family Medicine

## 2022-07-09 NOTE — Telephone Encounter (Signed)
Medication changes require office visit. Please schedule.

## 2022-07-10 ENCOUNTER — Telehealth (INDEPENDENT_AMBULATORY_CARE_PROVIDER_SITE_OTHER): Payer: Medicaid Other | Admitting: Family Medicine

## 2022-07-10 ENCOUNTER — Encounter: Payer: Self-pay | Admitting: Family Medicine

## 2022-07-10 DIAGNOSIS — F411 Generalized anxiety disorder: Secondary | ICD-10-CM

## 2022-07-10 MED ORDER — DULOXETINE HCL 60 MG PO CPEP: 60.0000 mg | ORAL_CAPSULE | Freq: Two times a day (BID) | ORAL | 3 refills | Status: DC

## 2022-07-10 NOTE — Progress Notes (Signed)
Subjective:    Patient ID: Laurie Howell, female    DOB: Nov 02, 1976, 46 y.o.   MRN: [id number]   HPI: Laurie Howell is a 46 y.o. female presenting for anxiety and depression. Feels the duloxetine worked best. Multimedia programmer gave her HA daily and she feels hot all the time. It isn't relieving the anxiety.      06/12/2022    1:54 PM 03/12/2022    2:14 PM 03/12/2022    2:09 PM 08/24/2021   10:10 AM 08/17/2021    3:22 PM  Depression screen PHQ 2/9  Decreased Interest 0 2 0 0 0  Down, Depressed, Hopeless 0 3 0 0 0  PHQ - 2 Score 0 5 0 0 0  Altered sleeping  3     Tired, decreased energy  3     Change in appetite  3     Feeling bad or failure about yourself   2     Trouble concentrating  2     Moving slowly or fidgety/restless  0     Suicidal thoughts  0     PHQ-9 Score  18     Difficult doing work/chores  Very difficult        Relevant past medical, surgical, family and social history reviewed and updated as indicated.  Interim medical history since our last visit reviewed. Allergies and medications reviewed and updated.  ROS:  Review of Systems  Constitutional: Negative.   HENT: Negative.    Eyes:  Negative for visual disturbance.  Respiratory:  Negative for shortness of breath.   Cardiovascular:  Negative for chest pain.  Gastrointestinal:  Negative for abdominal pain.  Musculoskeletal:  Negative for arthralgias.     Social History   Tobacco Use  Smoking Status Former   Years: 1.00   Types: Cigarettes   Quit date: 12/30/1994   Years since quitting: 27.5  Smokeless Tobacco Never  Tobacco Comments   2-3 daily       Objective:     Wt Readings from Last 3 Encounters:  06/12/22 136 lb 9.6 oz (62 kg)  04/09/22 137 lb (62.1 kg)  03/12/22 133 lb 9.6 oz (60.6 kg)     Exam deferred. Pt. Harboring due to COVID 19. Phone visit performed.   Assessment & Plan:   1. GAD (generalized anxiety disorder)     Meds ordered this encounter  Medications   DULoxetine  (CYMBALTA) 60 MG capsule    Sig: Take 1 capsule (60 mg total) by mouth 2 (two) times daily. TAKE 1 CAPSULE BY MOUTH EVERY DAY    Dispense:  180 capsule    Refill:  3    F41.1 GAD    No orders of the defined types were placed in this encounter.     Diagnoses and all orders for this visit:  GAD (generalized anxiety disorder) -     DULoxetine (CYMBALTA) 60 MG capsule; Take 1 capsule (60 mg total) by mouth 2 (two) times daily. TAKE 1 CAPSULE BY MOUTH EVERY DAY    Virtual Visit via telephone Note  I discussed the limitations, risks, security and privacy concerns of performing an evaluation and management service by telephone and the availability of in person appointments. The patient was identified with two identifiers. Pt.expressed understanding and agreed to proceed. Pt. Is at home. Dr. Livia Snellen is in his office.  Follow Up Instructions:   I discussed the assessment and treatment plan with the patient. The patient was provided an opportunity  to ask questions and all were answered. The patient agreed with the plan and demonstrated an understanding of the instructions.   The patient was advised to call back or seek an in-person evaluation if the symptoms worsen or if the condition fails to improve as anticipated.   Total minutes including chart review and phone contact time: 12   Follow up plan: Return in about 6 weeks (around 08/21/2022).  Claretta Fraise, MD [place]

## 2022-07-10 NOTE — Addendum Note (Signed)
Addended by: Baldomero Lamy B on: 07/10/2022 04:03 PM   Modules accepted: Orders

## 2022-09-09 ENCOUNTER — Ambulatory Visit: Payer: Medicaid Other | Admitting: Family Medicine

## 2022-10-02 ENCOUNTER — Ambulatory Visit: Payer: Medicaid Other | Admitting: Family Medicine

## 2022-10-29 ENCOUNTER — Encounter: Payer: Self-pay | Admitting: Family Medicine

## 2022-10-29 ENCOUNTER — Ambulatory Visit: Payer: Medicaid Other | Admitting: Family Medicine

## 2022-10-29 VITALS — BP 127/79 | HR 107 | Temp 98.0°F | Ht 62.0 in | Wt 144.4 lb

## 2022-10-29 DIAGNOSIS — F411 Generalized anxiety disorder: Secondary | ICD-10-CM

## 2022-10-29 DIAGNOSIS — R140 Abdominal distension (gaseous): Secondary | ICD-10-CM | POA: Diagnosis not present

## 2022-10-29 DIAGNOSIS — R7989 Other specified abnormal findings of blood chemistry: Secondary | ICD-10-CM | POA: Diagnosis not present

## 2022-10-29 DIAGNOSIS — E785 Hyperlipidemia, unspecified: Secondary | ICD-10-CM

## 2022-10-29 DIAGNOSIS — S069X9A Unspecified intracranial injury with loss of consciousness of unspecified duration, initial encounter: Secondary | ICD-10-CM | POA: Diagnosis not present

## 2022-10-29 DIAGNOSIS — R600 Localized edema: Secondary | ICD-10-CM

## 2022-10-29 DIAGNOSIS — G44321 Chronic post-traumatic headache, intractable: Secondary | ICD-10-CM

## 2022-10-29 DIAGNOSIS — D509 Iron deficiency anemia, unspecified: Secondary | ICD-10-CM

## 2022-10-29 MED ORDER — QUETIAPINE FUMARATE 25 MG PO TABS: 25.0000 mg | ORAL_TABLET | Freq: Every day | ORAL | 1 refills | Status: DC

## 2022-10-29 MED ORDER — FUROSEMIDE 20 MG PO TABS: 20.0000 mg | ORAL_TABLET | Freq: Every day | ORAL | 3 refills | Status: DC | PRN
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
## 2022-10-30 LAB — CBC WITH DIFFERENTIAL/PLATELET
Basophils Absolute: 0.1 10*3/uL (ref 0.0–0.2)
Basos: 1 %
EOS (ABSOLUTE): 0.2 10*3/uL (ref 0.0–0.4)
Eos: 2 %
Hematocrit: 38.4 % (ref 34.0–46.6)
Hemoglobin: 13 g/dL (ref 11.1–15.9)
Immature Grans (Abs): 0.1 10*3/uL (ref 0.0–0.1)
Immature Granulocytes: 1 %
Lymphocytes Absolute: 1.8 10*3/uL (ref 0.7–3.1)
Lymphs: 23 %
MCH: 31 pg (ref 26.6–33.0)
MCHC: 33.9 g/dL (ref 31.5–35.7)
MCV: 91 fL (ref 79–97)
Monocytes Absolute: 0.5 10*3/uL (ref 0.1–0.9)
Monocytes: 6 %
Neutrophils Absolute: 5.5 10*3/uL (ref 1.4–7.0)
Neutrophils: 67 %
Platelets: 320 10*3/uL (ref 150–450)
RBC: 4.2 x10E6/uL (ref 3.77–5.28)
RDW: 14.2 % (ref 11.7–15.4)
WBC: 8.1 10*3/uL (ref 3.4–10.8)

## 2022-10-30 LAB — LIPID PANEL
Chol/HDL Ratio: 3.3 ratio (ref 0.0–4.4)
Cholesterol, Total: 200 mg/dL — ABNORMAL HIGH (ref 100–199)
HDL: 61 mg/dL (ref >39)
LDL Chol Calc (NIH): 106 mg/dL — ABNORMAL HIGH (ref 0–99)
Triglycerides: 194 mg/dL — ABNORMAL HIGH (ref 0–149)
VLDL Cholesterol Cal: 33 mg/dL (ref 5–40)

## 2022-10-30 LAB — CMP14+EGFR
ALT: 13 IU/L (ref 0–32)
AST: 18 IU/L (ref 0–40)
Albumin/Globulin Ratio: 1.3 (ref 1.2–2.2)
Albumin: 4 g/dL (ref 3.9–4.9)
Alkaline Phosphatase: 89 IU/L (ref 44–121)
BUN/Creatinine Ratio: 16 (ref 9–23)
BUN: 9 mg/dL (ref 6–24)
Bilirubin Total: 0.2 mg/dL (ref 0.0–1.2)
CO2: 22 mmol/L (ref 20–29)
Calcium: 9 mg/dL (ref 8.7–10.2)
Chloride: 102 mmol/L (ref 96–106)
Creatinine, Ser: 0.55 mg/dL — ABNORMAL LOW (ref 0.57–1.00)
Globulin, Total: 3.2 g/dL (ref 1.5–4.5)
Glucose: 91 mg/dL (ref 70–99)
Potassium: 4.6 mmol/L (ref 3.5–5.2)
Sodium: 136 mmol/L (ref 134–144)
Total Protein: 7.2 g/dL (ref 6.0–8.5)
eGFR: 114 mL/min/{1.73_m2} (ref >59)

## 2022-10-30 LAB — IRON,TIBC AND FERRITIN PANEL
Ferritin: 12 ng/mL — ABNORMAL LOW (ref 15–150)
Iron Saturation: 14 % — ABNORMAL LOW (ref 15–55)
Iron: 63 ug/dL (ref 27–159)
Total Iron Binding Capacity: 438 ug/dL (ref 250–450)
UIBC: 375 ug/dL (ref 131–425)

## 2022-10-30 LAB — TSH+FREE T4
Free T4: 0.73 ng/dL — ABNORMAL LOW (ref 0.82–1.77)
TSH: 0.357 u[IU]/mL — ABNORMAL LOW (ref 0.450–4.500)

## 2023-04-10 ENCOUNTER — Ambulatory Visit: Payer: Medicaid Other | Admitting: Nurse Practitioner

## 2023-04-10 ENCOUNTER — Encounter: Payer: Self-pay | Admitting: Nurse Practitioner

## 2023-04-10 VITALS — BP 130/86 | HR 123 | Temp 97.5°F | Resp 20 | Ht 62.0 in | Wt 143.0 lb

## 2023-04-10 DIAGNOSIS — M999 Biomechanical lesion, unspecified: Secondary | ICD-10-CM

## 2023-04-10 NOTE — Progress Notes (Signed)
Subjective:    Patient ID: Laurie Howell, female    DOB: [birth year], 47 y.o.   MRN: [id number]   Chief Complaint: Lumps in private area (Comes and goes/)   HPI   Patient has pumps in private area that come and go. When they pop up they will burn some. They have been intermittent for about 2 weeks. They get better then come back Patient Active Problem List   Diagnosis Date Noted   History of UTI 07/19/2020   Irritable bowel syndrome with constipation 05/10/2020   GAD (generalized anxiety disorder) 08/13/2019   Chronic idiopathic constipation 08/13/2019   Insomnia due to medical condition 08/13/2019   Abnormal thyroid blood test 07/22/2019   Nerve pain 08/12/2017   Abdominal bloating 06/03/2017   Depression, recurrent 06/03/2017   Gastroesophageal reflux disease with esophagitis    Iron deficiency anemia 03/28/2016   Intractable migraine without aura 01/14/2014   Daily headache 01/14/2014       Review of Systems  Constitutional:  Negative for diaphoresis.  Eyes:  Negative for pain.  Respiratory:  Negative for shortness of breath.   Cardiovascular:  Negative for chest pain, palpitations and leg swelling.  Gastrointestinal:  Negative for abdominal pain.  Endocrine: Negative for polydipsia.  Skin:  Negative for rash.  Neurological:  Negative for dizziness, weakness and headaches.  Hematological:  Does not bruise/bleed easily.  All other systems reviewed and are negative.      Objective:   Physical Exam Vitals and nursing note reviewed.  Constitutional:      General: She is not in acute distress.    Appearance: Normal appearance. She is well-developed.  Neck:     Vascular: No carotid bruit or JVD.  Cardiovascular:     Rate and Rhythm: Normal rate and regular rhythm.     Heart sounds: Normal heart sounds.  Pulmonary:     Effort: Pulmonary effort is normal. No respiratory distress.     Breath sounds: Normal breath sounds. No wheezing or rales.  Chest:     Chest  wall: No tenderness.  Abdominal:     General: Bowel sounds are normal. There is no distension or abdominal bruit.     Palpations: Abdomen is soft. There is no hepatomegaly, splenomegaly, mass or pulsatile mass.     Tenderness: There is no abdominal tenderness.  Musculoskeletal:        General: Normal range of motion.     Cervical back: Normal range of motion and neck supple.  Lymphadenopathy:     Cervical: No cervical adenopathy.  Skin:    General: Skin is warm and dry.     Comments: 3cm annular papular lesion on left pubis No lesion noted in area of concern  Neurological:     Mental Status: She is alert and oriented to person, place, and time.     Deep Tendon Reflexes: Reflexes are normal and symmetric.  Psychiatric:        Behavior: Behavior normal.        Thought Content: Thought content normal.        Judgment: Judgment normal.    BP 130/86   Pulse (!) 123   Temp (!) 97.5 F (36.4 C) (Temporal)   Resp 20   Ht 5\' 2"  (1.575 m)   Wt 143 lb (64.9 kg)   SpO2 98%   BMI 26.16 kg/m         Assessment & Plan:  ROMAISA CHARO in today with chief complaint of Lumps  in private area (Comes and goes/)   1. Nonallopathic lesion of pubic region Blood test for HSV  Safe sex encouraged Will do derm referral for mole like lesion on pubis - HSV(herpes simplex vrs) 1+2 ab-IgG - Ambulatory referral to Dermatology    The above assessment and management plan was discussed with the patient. The patient verbalized understanding of and has agreed to the management plan. Patient is aware to call the clinic if symptoms persist or worsen. Patient is aware when to return to the clinic for a follow-up visit. Patient educated on when it is appropriate to go to the emergency department.   Mary-Margaret Daphine Deutscher, FNP

## 2023-04-10 NOTE — Patient Instructions (Signed)
Mole A mole is a colored (pigmented) growth on the skin. Moles are very common. They are usually harmless, but some moles can become cancerous over time. What are the causes? Moles are caused when pigmented skin cells grow together in clusters instead of spreading out in the skin as they normally do. The reason why the skin cells grow together in clusters is not known. What increases the risk? You are more likely to develop a mole if: You have family members who have moles. You are fair skinned. You have red or blond hair. You are often outdoors and exposed to the sun. You received phototherapy when you were a newborn baby. You are female. What are the signs or symptoms? A mole may occur anywhere on your skin. A mole may be: Brown or another color. Although moles are most often brown, they can also be tan, black, red, pink, blue, skin-toned, or colorless. Flat or raised. Smooth or wrinkled. Round in shape. How is this diagnosed? A mole is diagnosed with a skin exam. If your health care provider thinks a mole may be cancerous, all or part of the mole will be removed for testing (biopsy). How is this treated? Most moles are noncancerous (benign) and do not require treatment. If a mole is found to be cancerous, it will be removed. You may also choose to have a mole removed if it is causing pain or if you do not like the way it looks. Follow these instructions at home: General instructions  Every month, look for new moles and check your existing moles for changes. This is important because a change in a mole can mean that the mole has become cancerous. ABCDE changes in a mole indicate that you should be evaluated by your health care provider. ABCDE stands for: Asymmetry. This means the mole has an irregular shape. It is not round or oval. Border. This means the mole has an irregular or bumpy border. Color. This means the mole has multiple colors in it, including brown, black, blue, red, or  tan. Note that it is normal for moles to get darker when a woman is pregnant or takes birth control pills. Diameter. This means the mole is more than 0.2 inches (6 mm) across. Evolving. This refers to any unusual changes or symptoms in the mole, such as pain, itching, stinging, sensitivity, or bleeding. If you have a large number of moles, see a skin doctor (dermatologist) at least one time every year for a full-body skin check. Lifestyle  When you are outdoors, wear sunscreen with SPF 30 (sun protection factor 30) or higher. Use an adequate amount of sunscreen to cover exposed areas of skin. Put it on 30 minutes before you go out. Reapply it every 2 hours or anytime you come out of the water. When you are out in the sun, wear a broad-brimmed hat and clothing that covers your arms and legs. Wear wraparound sunglasses. Contact a health care provider if: The size, shape, borders, or color of your mole changes. Your mole, or the skin near the mole, becomes painful, sore, red, or swollen. Your mole: Develops more than one color. Itches or bleeds. Becomes scaly, sheds skin, or oozes fluid. Becomes flat or develops raised areas. Becomes hard or soft. You develop a new mole. Summary A mole is a colored (pigmented) growth on the skin. Moles are very common. They are usually harmless, but some moles can become cancerous over time. Every month, look for new moles and check your   existing moles for changes. This is important because a change in a mole can mean that the mole has become cancerous. If you have a large number of moles, see a skin doctor (dermatologist) at least one time every year for a full-body skin check. When you are outdoors, wear sunscreen with SPF 30 (sun protection factor 30) or higher. Reapply it every 2 hours or anytime you come out of the water. Contact a health care provider if you notice changes in a mole or if you develop a new mole. This information is not intended to replace  advice given to you by your health care provider. Make sure you discuss any questions you have with your health care provider. Document Revised: 09/06/2021 Document Reviewed: 09/06/2021 Elsevier Patient Education  2023 [employer]  

## 2023-04-10 NOTE — Addendum Note (Signed)
Addended by: Bennie Pierini on: 04/10/2023 02:15 PM   Modules accepted: Orders

## 2023-04-11 LAB — HSV 1 AND 2 AB, IGG
HSV 1 Glycoprotein G Ab, IgG: <0.91 index (ref 0.00–0.90)
HSV 2 IgG, Type Spec: 18.2 index — ABNORMAL HIGH (ref 0.00–0.90)

## 2023-04-11 MED ORDER — VALACYCLOVIR HCL 500 MG PO TABS: 500.0000 mg | ORAL_TABLET | Freq: Every day | ORAL | 1 refills | Status: DC

## 2023-04-11 NOTE — Addendum Note (Signed)
Addended by: Bennie Pierini on: 04/11/2023 04:35 PM   Modules accepted: Orders

## 2023-04-15 ENCOUNTER — Telehealth (INDEPENDENT_AMBULATORY_CARE_PROVIDER_SITE_OTHER): Payer: Medicaid Other | Admitting: Family Medicine

## 2023-04-15 ENCOUNTER — Encounter: Payer: Self-pay | Admitting: Family Medicine

## 2023-04-15 DIAGNOSIS — R894 Abnormal immunological findings in specimens from other organs, systems and tissues: Secondary | ICD-10-CM

## 2023-04-15 NOTE — Progress Notes (Signed)
Consultation: pt. 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  We discussed that this indicated she does have the germ and it can be contagious. It is only contagious when she has an outbreak. Some outbreaks can be in the vagina. Monitor closely for tingling and burning and especially tiny patches of blisters. Treatment available as needed for outbreaks.  Encounter Diagnosis  Name Primary?   Herpes simplex antibody positive Yes     Video visit  I discussed the limitations, risks, security and privacy concerns of performing an evaluation and management service by telephone and the availability of in person appointments. I also discussed with the patient that there may be a patient responsible charge related to this service. The patient expressed understanding and agreed to proceed. Pt. Is at home. Dr. Darlyn Read is in his office.  Follow Up Instructions:   I discussed the assessment and treatment plan with the patient. The patient was provided an opportunity to ask questions and all were answered. The patient agreed with the plan and demonstrated an understanding of the instructions.   The patient was advised to call back or seek an in-person evaluation if the symptoms worsen or if the condition fails to improve as anticipated.  Total minutes including chart review and phone contact time: 12

## 2023-04-29 ENCOUNTER — Other Ambulatory Visit: Payer: Self-pay | Admitting: *Deleted

## 2023-04-29 ENCOUNTER — Ambulatory Visit: Payer: Medicaid Other | Admitting: Family Medicine

## 2023-04-29 DIAGNOSIS — F411 Generalized anxiety disorder: Secondary | ICD-10-CM

## 2023-04-29 MED ORDER — DULOXETINE HCL 60 MG PO CPEP
60.0000 mg | ORAL_CAPSULE | Freq: Two times a day (BID) | ORAL | 0 refills | Status: DC
Start: 2023-04-29 — End: 2023-06-24

## 2023-06-02 ENCOUNTER — Encounter: Payer: Self-pay | Admitting: Family Medicine

## 2023-06-02 ENCOUNTER — Ambulatory Visit: Payer: Medicaid Other | Admitting: Family Medicine

## 2023-06-24 ENCOUNTER — Ambulatory Visit (INDEPENDENT_AMBULATORY_CARE_PROVIDER_SITE_OTHER): Payer: Medicaid Other | Admitting: Family Medicine

## 2023-06-24 ENCOUNTER — Encounter: Payer: Self-pay | Admitting: Family Medicine

## 2023-06-24 VITALS — BP 127/77 | HR 114 | Temp 97.8°F | Ht 62.0 in | Wt 140.8 lb

## 2023-06-24 DIAGNOSIS — F411 Generalized anxiety disorder: Secondary | ICD-10-CM

## 2023-06-24 DIAGNOSIS — E785 Hyperlipidemia, unspecified: Secondary | ICD-10-CM

## 2023-06-24 DIAGNOSIS — D509 Iron deficiency anemia, unspecified: Secondary | ICD-10-CM

## 2023-06-24 DIAGNOSIS — R7989 Other specified abnormal findings of blood chemistry: Secondary | ICD-10-CM

## 2023-06-24 DIAGNOSIS — R946 Abnormal results of thyroid function studies: Secondary | ICD-10-CM | POA: Diagnosis not present

## 2023-06-24 MED ORDER — QUETIAPINE FUMARATE 25 MG PO TABS: 25.0000 mg | ORAL_TABLET | Freq: Every day | ORAL | 1 refills | Status: DC

## 2023-06-24 MED ORDER — DULOXETINE HCL 60 MG PO CPEP: 60.0000 mg | ORAL_CAPSULE | Freq: Two times a day (BID) | ORAL | 3 refills | Status: DC

## 2023-06-24 MED ORDER — ACYCLOVIR 5 % EX OINT: 1.0000 | TOPICAL_OINTMENT | CUTANEOUS | 5 refills | Status: AC

## 2023-06-24 MED ORDER — MAGNESIUM 400 MG PO TABS: 400.0000 mg | ORAL_TABLET | Freq: Every evening | ORAL | 3 refills | Status: AC

## 2023-06-24 NOTE — Progress Notes (Signed)
Subjective:  Patient ID: Laurie Howell, female    DOB: 1976/09/27  Age: 47 y.o. MRN: [id number]  CC: Medical Management of Chronic Issues   HPI Laurie Howell presents for follow-up of elevated cholesterol. Doing well without complaints on current medication. Denies side effects of statin including myalgia and arthralgia and nausea. Also in today for liver function testing. Currently no chest pain, shortness of breath or other cardiovascular related symptoms noted.  Restless legs getting worse. Off med. Now keeping her up. Med worked well. Chart review shows she used magnesium. Pt. Gets anxious in crowds, like in a restaurant.  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   She has a past surgical history that includes Tubal ligation (1997); Tubal reversal (02/2015); Esophagogastroduodenoscopy (N/A, 06/27/2016); Savory dilation (N/A, 06/27/2016); and Hysteroscopy (N/A, 03/22/2020).   Her family history includes Anxiety disorder in her father; Depression in her mother; Heart attack (age of onset: 39) in her father; Hypertension in her father and mother; Other (age of onset: 41) in her mother; Peripheral vascular disease (age of onset: 36) in her father; Stroke in her maternal grandmother.She reports that she quit smoking about 28 years ago. Her smoking use included cigarettes. She has never used smokeless tobacco. She reports that she does not drink alcohol and does not use drugs.  Current Outpatient Medications on File  Prior to Visit  Medication Sig Dispense Refill   valACYclovir (VALTREX) 500 MG tablet Take 1 tablet (500 mg total) by mouth daily. 90 tablet 1   No current facility-administered medications on file prior to visit.    ROS Review of Systems  Constitutional:  Positive for fatigue (tired all the time since she had the iron problem).  HENT: Negative.    Eyes:  Negative for visual disturbance.  Respiratory:  Negative for shortness of breath.   Cardiovascular:  Negative for chest pain.  Gastrointestinal:  Negative for abdominal pain.  Musculoskeletal:  Negative for arthralgias.    Objective:  BP 127/77   Pulse (!) 114   Temp 97.8 F (36.6 C)   Ht 5\' 2"  (1.575 m)   Wt 140 lb 12.8 oz (63.9 kg)   SpO2 94%   BMI 25.75 kg/m   BP Readings from Last 3 Encounters:  06/24/23 127/77  04/10/23 130/86  10/29/22 127/79    Wt Readings from Last 3 Encounters:  06/24/23 140 lb 12.8 oz (63.9 kg)  04/10/23 143 lb (64.9 kg)  10/29/22 144 lb 6.4 oz (65.5 kg)     Physical Exam Constitutional:      General: She is not in acute distress.    Appearance: She is well-developed.  Cardiovascular:     Rate and Rhythm: Normal rate and regular rhythm.  Pulmonary:     Breath sounds: Normal breath sounds.  Musculoskeletal:        General: Normal range of motion.  Skin:    General: Skin is warm and dry.  Neurological:     Mental Status: She is alert and oriented to person, place, and time.     Lab Results  Component Value Date   HGBA1C 5.2 07/23/2019    Lab Results  Component Value  Date   WBC 8.1 10/29/2022   HGB 13.0 10/29/2022   HCT 38.4 10/29/2022   PLT 320 10/29/2022   GLUCOSE 91 10/29/2022   CHOL 200 (H) 10/29/2022   TRIG 194 (H) 10/29/2022   HDL 61 10/29/2022   LDLCALC 106 (H) 10/29/2022   ALT 13 10/29/2022   AST 18 10/29/2022   NA 136 10/29/2022   K 4.6 10/29/2022   CL 102 10/29/2022   CREATININE 0.55 (L) 10/29/2022   BUN 9 10/29/2022   CO2 22 10/29/2022   TSH 0.357  (L) 10/29/2022   INR 1.17 02/06/2015   HGBA1C 5.2 07/23/2019    CARDIAC EVENT MONITOR  Result Date: 01/21/2021 Normal sinus rhythm No arrhythmias Symptoms of fatigue happened during normal sinus rhythm   ECHOCARDIOGRAM COMPLETE  Result Date: 12/18/2020    ECHOCARDIOGRAM REPORT   Patient Name:   Laurie Howell Date of Exam: 12/18/2020 Medical Rec #:  [id number]      Height:       62.0 in Accession #:    [id number]     Weight:       129.6 lb Date of Birth:  Jan 01, 1976      BSA:          1.590 m Patient Age:    44 years       BP:           118/74 mmHg Patient Gender: F              HR:           86 bpm. [facility] Procedure: 2D Echo, Cardiac Doppler and Color Doppler Indications:    R55 (ICD-10-CM) - Syncope and collapse  History:        Patient has no prior history of Echocardiogram examinations.                 Gastroesophageal reflux disease with esophagitis.  Sonographer:    Celesta Gentile RCS Referring Phys: [id number] JAMES HOCHREIN IMPRESSIONS  1. Left ventricular ejection fraction, by estimation, is 60 to 65%. The left ventricle has normal function. The left ventricle has no regional wall motion abnormalities. Left ventricular diastolic parameters were normal.  2. Right ventricular systolic function is normal. The right ventricular size is normal. There is normal pulmonary artery systolic pressure.  3. The mitral valve is normal in structure. Trivial mitral valve regurgitation. No evidence of mitral stenosis.  4. The aortic valve is normal in structure. Aortic valve regurgitation is trivial. Mild aortic valve sclerosis is present, with no evidence of aortic valve stenosis.  5. The inferior vena cava is normal in size with greater than 50% respiratory variability, suggesting right atrial pressure of 3 mmHg. FINDINGS  Left Ventricle: Left ventricular ejection fraction, by estimation, is 60 to 65%. The left ventricle has normal function. The left ventricle has no regional wall motion  abnormalities. The left ventricular internal cavity size was normal in size. There is  no left ventricular hypertrophy. Left ventricular diastolic parameters were normal. Right Ventricle: The right ventricular size is normal. No increase in right ventricular wall thickness. Right ventricular systolic function is normal. There is normal pulmonary artery systolic pressure. The tricuspid regurgitant velocity is 2.02 m/s, and  with an assumed right atrial pressure of 3 mmHg, the estimated right ventricular systolic pressure is 19.3 mmHg. Left Atrium: Left atrial size was normal in size. Right Atrium: Right atrial size was normal in size. Pericardium: There is no evidence of  pericardial effusion. Mitral Valve: The mitral valve is normal in structure. Trivial mitral valve regurgitation. No evidence of mitral valve stenosis. There is no evidence of mitral valve vegetation. Tricuspid Valve: The tricuspid valve is normal in structure. Tricuspid valve regurgitation is mild . No evidence of tricuspid stenosis. There is no evidence of tricuspid valve vegetation. Aortic Valve: The aortic valve is normal in structure. Aortic valve regurgitation is trivial. Mild aortic valve sclerosis is present, with no evidence of aortic valve stenosis. There is no evidence of aortic valve vegetation. Pulmonic Valve: The pulmonic valve was not well visualized. Pulmonic valve regurgitation is not visualized. No evidence of pulmonic stenosis. Aorta: The aortic root is normal in size and structure. Venous: The inferior vena cava is normal in size with greater than 50% respiratory variability, suggesting right atrial pressure of 3 mmHg. IAS/Shunts: No atrial level shunt detected by color flow Doppler.  LEFT VENTRICLE PLAX 2D LVIDd:         4.00 cm  Diastology LVIDs:         2.80 cm  LV e' medial:    11.10 cm/s LV PW:         0.80 cm  LV E/e' medial:  8.8 LV IVS:        0.70 cm  LV e' lateral:   13.60 cm/s LVOT diam:     1.80 cm  LV E/e' lateral: 7.2  LV SV:         66 LV SV Index:   42 LVOT Area:     2.54 cm  RIGHT VENTRICLE RV S prime:     10.70 cm/s TAPSE (M-mode): 1.9 cm LEFT ATRIUM             Index       RIGHT ATRIUM           Index LA diam:        2.60 cm 1.64 cm/m  RA Area:     13.40 cm LA Vol (A2C):   46.7 ml 29.38 ml/m RA Volume:   30.80 ml  19.37 ml/m LA Vol (A4C):   23.3 ml 14.66 ml/m LA Biplane Vol: 34.2 ml 21.51 ml/m  AORTIC VALVE LVOT Vmax:   119.00 cm/s LVOT Vmean:  79.100 cm/s LVOT VTI:    0.261 m  AORTA Ao Root diam: 2.80 cm MITRAL VALVE               TRICUSPID VALVE MV Area (PHT): 2.96 cm    TR Peak grad:   16.3 mmHg MV Decel Time: 256 msec    TR Vmax:        202.00 cm/s MV E velocity: 97.70 cm/s MV A velocity: 57.00 cm/s  SHUNTS MV E/A ratio:  1.71        Systemic VTI:  0.26 m                            Systemic Diam: 1.80 cm Charlton Haws MD Electronically signed by Charlton Haws MD Signature Date/Time: 12/18/2020/3:21:34 PM    Final     Assessment & Plan:   Anasia was seen today for medical management of chronic issues.  Diagnoses and all orders for this visit:  Abnormal thyroid blood test -     TSH + free T4  Iron deficiency anemia, unspecified iron deficiency anemia type -     CBC with Differential/Platelet -     Iron, TIBC and Ferritin Panel  Hyperlipidemia, unspecified hyperlipidemia type -     CMP14+EGFR -     Lipid panel  GAD (generalized anxiety disorder) -     DULoxetine (CYMBALTA) 60 MG capsule; Take 1 capsule (60 mg total) by mouth 2 (two) times daily. -     QUEtiapine (SEROQUEL) 25 MG tablet; Take 1 tablet (25 mg total) by mouth at bedtime.  Other orders -     Magnesium 400 MG TABS; Take 400 mg by mouth every evening. For restless legs -     acyclovir ointment (ZOVIRAX) 5 %; Apply 1 Application topically every 3 (three) hours.   I have changed Tiawana S. Poma's Magnesium. I am also having her start on acyclovir ointment. Additionally, I am having her maintain her valACYclovir, DULoxetine, and  QUEtiapine.  Meds ordered this encounter  Medications   DULoxetine (CYMBALTA) 60 MG capsule    Sig: Take 1 capsule (60 mg total) by mouth 2 (two) times daily.    Dispense:  180 capsule    Refill:  3    F41.1 GAD   Magnesium 400 MG TABS    Sig: Take 400 mg by mouth every evening. For restless legs    Dispense:  90 tablet    Refill:  3   acyclovir ointment (ZOVIRAX) 5 %    Sig: Apply 1 Application topically every 3 (three) hours.    Dispense:  30 g    Refill:  5   QUEtiapine (SEROQUEL) 25 MG tablet    Sig: Take 1 tablet (25 mg total) by mouth at bedtime.    Dispense:  90 tablet    Refill:  1     Follow-up: Return in about 6 months (around 12/24/2023), or if symptoms worsen or fail to improve.  Mechele Claude, M.D.

## 2023-06-25 LAB — LIPID PANEL
Chol/HDL Ratio: 3 ratio (ref 0.0–4.4)
Cholesterol, Total: 139 mg/dL (ref 100–199)
HDL: 46 mg/dL (ref >39)
LDL Chol Calc (NIH): 74 mg/dL (ref 0–99)
Triglycerides: 106 mg/dL (ref 0–149)
VLDL Cholesterol Cal: 19 mg/dL (ref 5–40)

## 2023-06-25 LAB — CBC WITH DIFFERENTIAL/PLATELET
Basophils Absolute: 0.1 10*3/uL (ref 0.0–0.2)
Basos: 1 %
EOS (ABSOLUTE): 0.1 10*3/uL (ref 0.0–0.4)
Eos: 1 %
Hematocrit: 38.8 % (ref 34.0–46.6)
Hemoglobin: 12.7 g/dL (ref 11.1–15.9)
Immature Grans (Abs): 0.1 10*3/uL (ref 0.0–0.1)
Immature Granulocytes: 1 %
Lymphocytes Absolute: 1.4 10*3/uL (ref 0.7–3.1)
Lymphs: 14 %
MCH: 30.6 pg (ref 26.6–33.0)
MCHC: 32.7 g/dL (ref 31.5–35.7)
MCV: 94 fL (ref 79–97)
Monocytes Absolute: 0.7 10*3/uL (ref 0.1–0.9)
Monocytes: 7 %
Neutrophils Absolute: 7.5 10*3/uL — ABNORMAL HIGH (ref 1.4–7.0)
Neutrophils: 76 %
Platelets: 349 10*3/uL (ref 150–450)
RBC: 4.15 x10E6/uL (ref 3.77–5.28)
RDW: 13.4 % (ref 11.7–15.4)
WBC: 9.7 10*3/uL (ref 3.4–10.8)

## 2023-06-25 LAB — IRON,TIBC AND FERRITIN PANEL
Ferritin: 15 ng/mL (ref 15–150)
Iron Saturation: 16 % (ref 15–55)
Iron: 62 ug/dL (ref 27–159)
Total Iron Binding Capacity: 386 ug/dL (ref 250–450)
UIBC: 324 ug/dL (ref 131–425)

## 2023-06-25 LAB — CMP14+EGFR
ALT: 10 IU/L (ref 0–32)
AST: 18 IU/L (ref 0–40)
Albumin: 3.8 g/dL — ABNORMAL LOW (ref 3.9–4.9)
Alkaline Phosphatase: 86 IU/L (ref 44–121)
BUN/Creatinine Ratio: 12 (ref 9–23)
BUN: 9 mg/dL (ref 6–24)
Bilirubin Total: 0.2 mg/dL (ref 0.0–1.2)
CO2: 21 mmol/L (ref 20–29)
Calcium: 9.5 mg/dL (ref 8.7–10.2)
Chloride: 104 mmol/L (ref 96–106)
Creatinine, Ser: 0.77 mg/dL (ref 0.57–1.00)
Globulin, Total: 3 g/dL (ref 1.5–4.5)
Glucose: 91 mg/dL (ref 70–99)
Potassium: 4.3 mmol/L (ref 3.5–5.2)
Sodium: 138 mmol/L (ref 134–144)
Total Protein: 6.8 g/dL (ref 6.0–8.5)
eGFR: 96 mL/min/{1.73_m2} (ref >59)

## 2023-06-25 LAB — TSH+FREE T4
Free T4: 1.02 ng/dL (ref 0.82–1.77)
TSH: 0.032 u[IU]/mL — ABNORMAL LOW (ref 0.450–4.500)

## 2023-06-27 LAB — SPECIMEN STATUS REPORT

## 2023-06-28 LAB — T3, FREE: T3, Free: 3.8 pg/mL (ref 2.0–4.4)

## 2023-07-01 NOTE — Progress Notes (Signed)
Hello Shylyn,  Your lab result is normal and/or stable.Some minor variations that are not significant are commonly marked abnormal, but do not represent any medical problem for you.  Best regards, Journiee Feldkamp, M.D.

## 2023-08-20 NOTE — Progress Notes (Unsigned)
GI Office Note    Referring Provider: Mechele Claude, MD Primary Care Physician:  Mechele Claude, MD Primary Gastroenterologist: Hennie Duos. Marletta Lor, DO  Date:  08/21/2023  ID:  Laurie Howell, DOB Jan 14, 1976, MRN [id number]   Chief Complaint   Chief Complaint  Patient presents with   Constipation    Chronic constipation. Has not had a BM in 3 months.    History of Present Illness  Laurie Howell is a 47 y.o. female with a history of anxiety, depression, IDA, chronic gastritis, GERD, esophageal stricture, pelvic pain, headaches, uterine fibroids, chronic constipation presenting today with complaint of worsening constipation.  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  Complaints of dyspepsia for several months.  She reported bloating and early satiety as well as burning in her throat and chest.  Had a negative H. pylori recently.  Recently he switched to a Nexium 40 mg daily.  Reported constipation that was chronic and he had 1 bowel movement every 3-4 weeks with difficult defecation.  Had been on iron therapy for chronic iron deficiency anemia and needed iron infusions prior to her uterine ablation.  She had tried Linzess in recently Amitiza with no improvement.  Noted to have frequent gas and bloating as well.  Denied any family history of celiac.  Advised screening colonoscopy once constipation improved.  Advised to check celiac serologies.  Recommended increasing fiber intake, MiraLAX to 2 capfuls daily and add bisacodyl if ongoing constipation.  Trial of Trulance 3 mg daily.  Follow low FODMAP diet.  Today:  PCP has given her Linzess and that did not work. She states Trulance was not helpful and has tried Amitiza in the past as well.Just has a lot of headaches no vomiting. No narcotics. Constantly  feel dull and not able to eat much due to this. Can only eat small spoonfuls. She has severe pain if she overeats.   OTC she has tried chocolate ex lax. Tried miralax once daily for a week with no relief. Has not tried enemas or suppositories. Has a lot of tightness to her abdomen. Has a lot of gas but no stools. No issues with urination. No incontinence.   No other upper GI symptoms. Does not need PPI. Stopped taking iron about 6 months ago.   Current Outpatient Medications  Medication Sig Dispense Refill   acyclovir ointment (ZOVIRAX) 5 % Apply 1 Application topically every 3 (three) hours. 30 g 5   DULoxetine (CYMBALTA) 60 MG capsule Take 1 capsule (60 mg total) by mouth 2 (two) times daily. 180 capsule 3   Magnesium 400 MG TABS Take 400 mg by mouth every evening. For restless legs 90 tablet 3   QUEtiapine (SEROQUEL) 25 MG tablet Take 1 tablet (25 mg total) by mouth at bedtime. 90 tablet 1   valACYclovir (VALTREX) 500 MG tablet Take 1 tablet (500 mg total) by mouth daily. 90 tablet 1   No current facility-administered medications for this visit.    Past Medical History:  Diagnosis Date   Anemia    Anovulation 01/23/2017   Chronic gastritis    DUB (dysfunctional uterine bleeding) 06/03/2017   Esophageal stricture    Fibroid 06/18/2017   Frequent UTI    GERD (gastroesophageal reflux disease)    Headache    Infertility of tubal origin 05/01/2015   BTL before age  20 , tubal reanastomosis 03/06/2015 in [place]    Intractable chronic cluster headache 03/02/2020   Iron deficiency anemia 03/28/2016   due to menorrhagia   LLQ pain 06/03/2017   Menorrhagia with irregular cycle 01/13/2020   Menorrhagia with regular cycle 06/03/2017   Ovarian cyst    Pelvic pain 01/13/2020    Past Surgical History:  Procedure Laterality Date   ESOPHAGOGASTRODUODENOSCOPY N/A 06/27/2016   Procedure: ESOPHAGOGASTRODUODENOSCOPY (EGD);  Surgeon: West Bali, MD;  Location: [facility];  Service: Endoscopy;   Laterality: N/A;  1245   HYSTEROSCOPY N/A 03/22/2020   Procedure: HYSTEROSCOPY WITH MINERVA ENDOMETRIAL ABLATION;  Surgeon: Lazaro Arms, MD;  Location: [facility];  Service: Gynecology;  Laterality: N/A;   SAVORY DILATION N/A 06/27/2016   Procedure: SAVORY DILATION;  Surgeon: West Bali, MD;  Location: [facility];  Service: Endoscopy;  Laterality: N/A;   TUBAL LIGATION  1997   Tubal reversal  02/2015   [facility] - Dr Theodoro Kalata    Family History  Problem Relation Age of Onset   Depression Mother    Hypertension Mother    Other Mother 63       open heart surgery   Hypertension Father    Anxiety disorder Father    Heart attack Father 70   Peripheral vascular disease Father 33       CEA   Stroke Maternal Grandmother    Colon cancer Neg Hx     Allergies as of 08/21/2023 - Review Complete 08/21/2023  Allergen Reaction Noted   Ivp dye [iodinated contrast media] Anaphylaxis 07/09/2013   Shellfish allergy Anaphylaxis 07/09/2013   Mucinex dm [dm-guaifenesin er] Hives, Itching, and Nausea And Vomiting 04/10/2021   Aspirin Swelling and Other (See Comments) 07/09/2013   [facility] instant hand sanitizer [flavoring agent] Hives 08/23/2021    Social History   Socioeconomic History   Marital status: Divorced    Spouse name: Not on file   Number of children: 2   Years of education: 8th   Highest education level: Not on file  Occupational History   Occupation: N/A  Tobacco Use   Smoking status: Former    Current packs/day: 0.00    Types: Cigarettes    Start date: 12/30/1993    Quit date: 12/30/1994    Years since quitting: 28.6   Smokeless tobacco: Never   Tobacco comments:    2-3 daily  Vaping Use   Vaping status: Never Used  Substance and Sexual Activity   Alcohol use: No   Drug use: No   Sexual activity: Yes    Birth control/protection: None    Comment: tubal reversed  Other Topics Concern   Not on file  Social History Narrative   Not on file   Social Determinants of Health    Financial Resource Strain: Low Risk  (08/24/2021)   Overall Financial Resource Strain (CARDIA)    Difficulty of Paying Living Expenses: Not very hard  Food Insecurity: No Food Insecurity (08/24/2021)   Hunger Vital Sign    Worried About Running Out of Food in the Last Year: Never true    Ran Out of Food in the Last Year: Never true  Transportation Needs: No Transportation Needs (08/24/2021)   PRAPARE - Administrator, Civil Service (Medical): No    Lack of Transportation (Non-Medical): No  Physical Activity: Sufficiently Active (08/24/2021)   Exercise Vital Sign    Days of Exercise per Week: 7 days    Minutes of Exercise  per Session: 30 min  Stress: No Stress Concern Present (03/28/2020)   [employer] of Occupational Health - Occupational Stress Questionnaire    Feeling of Stress : Not at all  Social Connections: Socially Isolated (08/24/2021)   Social Connection and Isolation Panel [NHANES]    Frequency of Communication with Friends and Family: More than three times a week    Frequency of Social Gatherings with Friends and Family: More than three times a week    Attends Religious Services: Never    Database administrator or Organizations: No    Attends Engineer, structural: Never    Marital Status: Divorced     Review of Systems   Gen: Denies fever, chills, anorexia. Denies fatigue, weakness, weight loss.  CV: Denies chest pain, palpitations, syncope, peripheral edema, and claudication. Resp: Denies dyspnea at rest, cough, wheezing, coughing up blood, and pleurisy. GI: See HPI Derm: Denies rash, itching, dry skin Psych: Denies depression, anxiety, memory loss, confusion. No homicidal or suicidal ideation.  Heme: Denies bruising, bleeding, and enlarged lymph nodes.  Physical Exam   BP 124/85 (BP Location: Right Arm, Patient Position: Sitting, Cuff Size: Normal)   Pulse (!) 102   Temp 98.2 F (36.8 C) (Temporal)   Ht 5\' 2"  (1.575 m)   Wt 140 lb 6.4  oz (63.7 kg)   SpO2 99%   BMI 25.68 kg/m   General:   Alert and oriented. No distress noted. Pleasant and cooperative.  Head:  Normocephalic and atraumatic. Eyes:  Conjuctiva clear without scleral icterus. Mouth:  Oral mucosa pink and moist. Good dentition. No lesions. Lungs:  Clear to auscultation bilaterally. No wheezes, rales, or rhonchi. No distress.  Heart:  S1, S2 present without murmurs appreciated.  Abdomen:  +BS, soft, distended. Mild ttp to lower abdomen.  No rebound or guarding. No HSM or masses noted. Rectal: deferred Msk:  Symmetrical without gross deformities. Normal posture. Extremities:  Without edema. Neurologic:  Alert and  oriented x4 Psych:  Alert and cooperative. Normal mood and affect.   Assessment  Laurie Howell is a 47 y.o. female with a history of anxiety, depression, IDA, chronic gastritis, GERD, esophageal stricture, pelvic pain, headaches, uterine fibroids, chronic constipation presenting today with complaint of worsening constipation.  Screening colon cancer: Has never had a colonoscopy.  Currently overdue for screening purposes however given her severe constipation we need to get this under control prior to scheduling.  Will discuss at follow-up.  Chronic constipation: When seen last year patient reported bowel movement every 3 to 4 weeks.  She has previously tried Comptroller, Amitiza, Linzess, MiraLAX once daily for a week, and over-the-counter chocolate Ex-Lax without good relief of constipation in the past.  Present complaint of no bowel movement within the last 3 months, not even a smear.  She has been passing gas however has developed significant abdominal distention and although she is soft on exam she does have some mild tenderness.  No significant nausea or vomiting.  Before getting a bowel prep and starting an aggressive bowel regimen I will get a KUB to rule out obstruction.  Pending KUB findings may need to obtain a CT scan.  If KUB negative we will  perform a bowel prep and then start on Linzess likely with the addition of Motegrity or MiraLAX twice daily.    PLAN   KUB ASAP If KUB negative for obstruction we will have her complete a bowel prep and start Linzess 290 mcg daily. Will likely need  aggressive regimen with Linzess 290 along with addition of Motegrity or MiraLAX twice daily.  Will have more definitive plan for bowel regimen after receiving results of KUB and after completing bowel prep if negative.  Follow-up in 6 weeks    Brooke Bonito, MSN, FNP-BC, AGACNP-BC [facility]

## 2023-08-21 ENCOUNTER — Encounter: Payer: Self-pay | Admitting: Gastroenterology

## 2023-08-21 ENCOUNTER — Ambulatory Visit (HOSPITAL_COMMUNITY)
Admission: RE | Admit: 2023-08-21 | Discharge: 2023-08-21 | Disposition: A | Discharge status: Home / Self Care | Payer: Medicaid Other | Source: Ambulatory Visit | Attending: Gastroenterology | Admitting: Gastroenterology

## 2023-08-21 ENCOUNTER — Ambulatory Visit (INDEPENDENT_AMBULATORY_CARE_PROVIDER_SITE_OTHER): Payer: Medicaid Other | Admitting: Gastroenterology

## 2023-08-21 VITALS — BP 124/85 | HR 102 | Temp 98.2°F | Ht 62.0 in | Wt 140.4 lb

## 2023-08-21 DIAGNOSIS — R140 Abdominal distension (gaseous): Secondary | ICD-10-CM

## 2023-08-21 DIAGNOSIS — K5904 Chronic idiopathic constipation: Secondary | ICD-10-CM | POA: Diagnosis not present

## 2023-08-21 DIAGNOSIS — Z1211 Encounter for screening for malignant neoplasm of colon: Secondary | ICD-10-CM

## 2023-08-21 NOTE — Patient Instructions (Signed)
I have ordered an x-ray of your abdomen to assess for obstruction and the degree of constipation that you are having.  Pending results we may need to complete a CT scan.  If no evidence of obstruction then I would likely send in a bowel prep for you to complete and start you on Linzess.  I will provide you some samples of Linzess and Ibsrela today however please do not start taking either of these until we decide what you should try.  I like to see you in the office in 6 weeks for follow-up.  It was a pleasure to see you today. I want to create trusting relationships with patients. If you receive a survey regarding your visit,  I greatly appreciate you taking time to fill this out on paper or through your MyChart. I value your feedback.  Brooke Bonito, MSN, FNP-BC, AGACNP-BC [facility]

## 2023-08-25 DIAGNOSIS — J0100 Acute maxillary sinusitis, unspecified: Secondary | ICD-10-CM | POA: Diagnosis not present

## 2023-08-25 DIAGNOSIS — J029 Acute pharyngitis, unspecified: Secondary | ICD-10-CM | POA: Diagnosis not present

## 2023-08-25 DIAGNOSIS — R059 Cough, unspecified: Secondary | ICD-10-CM | POA: Diagnosis not present

## 2023-08-26 ENCOUNTER — Telehealth: Payer: Self-pay | Admitting: Gastroenterology

## 2023-08-26 NOTE — Telephone Encounter (Signed)
Patient left a message asking if we had the xrays back yet.

## 2023-08-29 ENCOUNTER — Other Ambulatory Visit: Payer: Self-pay | Admitting: Gastroenterology

## 2023-08-29 MED ORDER — PEG 3350-KCL-NABCB-NACL-NASULF 236 G PO SOLR: 4000.0000 mL | Freq: Once | ORAL | 0 refills | Status: AC

## 2023-08-29 NOTE — Telephone Encounter (Signed)
See previous note

## 2023-09-23 ENCOUNTER — Encounter (HOSPITAL_COMMUNITY): Payer: Self-pay | Admitting: Hematology & Oncology

## 2023-09-23 ENCOUNTER — Other Ambulatory Visit (HOSPITAL_COMMUNITY): Payer: Self-pay

## 2023-10-02 ENCOUNTER — Ambulatory Visit: Payer: Medicaid Other | Admitting: Gastroenterology

## 2023-10-02 ENCOUNTER — Encounter: Payer: Self-pay | Admitting: Gastroenterology

## 2023-10-05 ENCOUNTER — Other Ambulatory Visit: Payer: Self-pay | Admitting: Nurse Practitioner

## 2023-12-29 ENCOUNTER — Ambulatory Visit: Payer: Medicaid Other | Admitting: Family Medicine

## 2023-12-29 ENCOUNTER — Encounter: Payer: Self-pay | Admitting: Family Medicine

## 2023-12-29 VITALS — BP 124/77 | HR 123 | Temp 97.8°F | Ht 62.0 in | Wt 142.6 lb

## 2023-12-29 DIAGNOSIS — R000 Tachycardia, unspecified: Secondary | ICD-10-CM | POA: Diagnosis not present

## 2023-12-29 DIAGNOSIS — E785 Hyperlipidemia, unspecified: Secondary | ICD-10-CM

## 2023-12-29 DIAGNOSIS — R7989 Other specified abnormal findings of blood chemistry: Secondary | ICD-10-CM

## 2023-12-29 DIAGNOSIS — R232 Flushing: Secondary | ICD-10-CM

## 2023-12-29 DIAGNOSIS — D509 Iron deficiency anemia, unspecified: Secondary | ICD-10-CM

## 2023-12-29 DIAGNOSIS — R42 Dizziness and giddiness: Secondary | ICD-10-CM | POA: Diagnosis not present

## 2023-12-29 DIAGNOSIS — F411 Generalized anxiety disorder: Secondary | ICD-10-CM

## 2023-12-29 MED ORDER — QUETIAPINE FUMARATE 25 MG PO TABS: 25.0000 mg | ORAL_TABLET | Freq: Every day | ORAL | 3 refills | Status: DC

## 2023-12-29 MED ORDER — QUETIAPINE FUMARATE 50 MG PO TABS: 50.0000 mg | ORAL_TABLET | Freq: Every day | ORAL | 3 refills | Status: DC

## 2023-12-29 MED ORDER — VALACYCLOVIR HCL 500 MG PO TABS: 500.0000 mg | ORAL_TABLET | Freq: Every day | ORAL | 3 refills | Status: DC

## 2023-12-29 MED ORDER — LINACLOTIDE 145 MCG PO CAPS: 145.0000 ug | ORAL_CAPSULE | Freq: Every day | ORAL | 5 refills | Status: DC

## 2023-12-29 MED ORDER — MECLIZINE HCL 50 MG PO TABS: 50.0000 mg | ORAL_TABLET | Freq: Three times a day (TID) | ORAL | 0 refills | Status: AC | PRN

## 2023-12-29 NOTE — Progress Notes (Signed)
Subjective:  Patient ID: Laurie Howell, female    DOB: 05/19/1976  Age: 47 y.o. MRN: [id number]  CC: Medical Management of Chronic Issues   HPI Laurie Howell presents for anxiety.  Dizziness and feeling hot for 2 weeks. Associated with it. Frontal HA. Stabbing pain 24/7. Abd bloating Bms rare, irregular.  Describes the dizziness as things around her are spinning around.  Had uterine ablation.   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   She has a past surgical history that includes Tubal ligation (1997); Tubal reversal (02/2015); Esophagogastroduodenoscopy (N/A, 06/27/2016); Savory dilation (N/A, 06/27/2016); and Hysteroscopy (N/A, 03/22/2020).   Her family history includes Anxiety disorder in her father; Depression in her mother; Heart attack (age of onset: 24) in her father; Hypertension in her father and mother; Other (age of onset: 67) in her mother; Peripheral vascular disease (age of onset: 72) in her father; Stroke in her maternal grandmother.She reports that she quit smoking about 29 years ago. Her smoking use included cigarettes. She started smoking about 30 years ago. She has never used smokeless tobacco. She reports that she does not drink alcohol and does not use drugs.  Current Outpatient Medications on File Prior to Visit  Medication Sig Dispense Refill   acyclovir ointment (ZOVIRAX) 5 % Apply 1 Application topically every 3 (three) hours. 30 g 5   DULoxetine (CYMBALTA) 60 MG capsule Take 1  capsule (60 mg total) by mouth 2 (two) times daily. 180 capsule 3   Magnesium 400 MG TABS Take 400 mg by mouth every evening. For restless legs 90 tablet 3   No current facility-administered medications on file prior to visit.    ROS Review of Systems  Constitutional: Negative.   HENT: Negative.    Eyes:  Negative for visual disturbance.  Respiratory:  Negative for shortness of breath.   Cardiovascular:  Negative for chest pain.  Gastrointestinal:  Negative for abdominal pain.  Musculoskeletal:  Negative for arthralgias.    Objective:  BP 124/77   Pulse (!) 123   Temp 97.8 F (36.6 C)   Ht 5\' 2"  (1.575 m)   Wt 142 lb 9.6 oz (64.7 kg)   SpO2 95%   BMI 26.08 kg/m   BP Readings from Last 3 Encounters:  12/29/23 124/77  08/21/23 124/85  06/24/23 127/77    Wt Readings from Last 3 Encounters:  12/29/23 142 lb 9.6 oz (64.7 kg)  08/21/23 140 lb 6.4 oz (63.7 kg)  06/24/23 140 lb 12.8 oz (63.9 kg)     Physical Exam Constitutional:      General: She is not in acute distress.    Appearance: She is well-developed.  Cardiovascular:     Rate and Rhythm: Normal rate and regular rhythm.  Pulmonary:     Breath sounds: Normal breath sounds.  Musculoskeletal:        General: Normal range of motion.  Skin:    General: Skin is warm and dry.  Neurological:     Mental Status: She is alert and oriented to person, place, and time.     Lab Results  Component Value Date   HGBA1C 5.2 07/23/2019    Lab  Results  Component Value Date   WBC 9.7 06/24/2023   HGB 12.7 06/24/2023   HCT 38.8 06/24/2023   PLT 349 06/24/2023   GLUCOSE 91 06/24/2023   CHOL 139 06/24/2023   TRIG 106 06/24/2023   HDL 46 06/24/2023   LDLCALC 74 06/24/2023   ALT 10 06/24/2023   AST 18 06/24/2023   NA 138 06/24/2023   K 4.3 06/24/2023   CL 104 06/24/2023   CREATININE 0.77 06/24/2023   BUN 9 06/24/2023   CO2 21 06/24/2023   TSH 0.032 (L) 06/24/2023   INR 1.17 02/06/2015   HGBA1C 5.2 07/23/2019     DG Abd 1 View Result Date: 08/28/2023 CLINICAL DATA:  Abdominal pain. EXAM: ABDOMEN - 1 VIEW COMPARISON:  08/26/2018. FINDINGS: The bowel gas pattern is normal without gaseous distention. No radio-opaque calculi or other significant radiographic abnormality are seen. Increased stool noted consistent with constipation. IMPRESSION: Constipation. Electronically Signed   By: Layla Maw M.D.   On: 08/28/2023 15:35    Assessment & Plan:   Laurie Howell was seen today for medical management of chronic issues.  Diagnoses and all orders for this visit:  Abnormal thyroid blood test -     TSH + free T4 -     CMP14+EGFR  Iron deficiency anemia, unspecified iron deficiency anemia type -     Anemia Profile B -     CBC with Differential/Platelet -     CMP14+EGFR  Hyperlipidemia, unspecified hyperlipidemia type -     Lipid panel -     CMP14+EGFR  GAD (generalized anxiety disorder) -     Discontinue: QUEtiapine (SEROQUEL) 25 MG tablet; Take 1 tablet (25 mg total) by mouth at bedtime. -     CMP14+EGFR -     QUEtiapine (SEROQUEL) 50 MG tablet; Take 1 tablet (50 mg total) by mouth at bedtime.  Tachycardia -     EKG 12-Lead -     CMP14+EGFR  Vertigo -     CMP14+EGFR  Hot flashes -     FSH/LH  Other orders -     valACYclovir (VALTREX) 500 MG tablet; Take 1 tablet (500 mg total) by mouth daily. -     meclizine (ANTIVERT) 50 MG tablet; Take 1 tablet (50 mg total) by mouth 3 (three) times daily as needed. -     linaclotide (LINZESS) 145 MCG CAPS capsule; Take 1 capsule (145 mcg total) by mouth daily. To regulate bowel movements   I have discontinued Laurie Howell's QUEtiapine. I have also changed her valACYclovir and QUEtiapine. Additionally, I am having her start on meclizine and linaclotide. Lastly, I am having her maintain her DULoxetine, Magnesium, and acyclovir ointment.  Meds ordered this encounter  Medications   DISCONTD: QUEtiapine (SEROQUEL) 25 MG tablet    Sig: Take 1 tablet  (25 mg total) by mouth at bedtime.    Dispense:  90 tablet    Refill:  3   valACYclovir (VALTREX) 500 MG tablet    Sig: Take 1 tablet (500 mg total) by mouth daily.    Dispense:  90 tablet    Refill:  3   QUEtiapine (SEROQUEL) 50 MG tablet    Sig: Take 1 tablet (50 mg total) by mouth at bedtime.    Dispense:  90 tablet    Refill:  3    Replaces previously submitted prescription, thanks.   meclizine (ANTIVERT) 50 MG tablet    Sig: Take 1 tablet (50 mg total) by mouth 3 (three) times  daily as needed.    Dispense:  30 tablet    Refill:  0   linaclotide (LINZESS) 145 MCG CAPS capsule    Sig: Take 1 capsule (145 mcg total) by mouth daily. To regulate bowel movements    Dispense:  30 capsule    Refill:  5     Follow-up: No follow-ups on file.  Mechele Claude, M.D.

## 2023-12-30 LAB — TSH+FREE T4
Free T4: 0.93 ng/dL (ref 0.82–1.77)
TSH: 0.044 u[IU]/mL — ABNORMAL LOW (ref 0.450–4.500)

## 2023-12-30 LAB — ANEMIA PROFILE B
Basophils Absolute: 0 10*3/uL (ref 0.0–0.2)
Basos: 1 %
EOS (ABSOLUTE): 0.1 10*3/uL (ref 0.0–0.4)
Eos: 1 %
Ferritin: 12 ng/mL — ABNORMAL LOW (ref 15–150)
Folate: 4.4 ng/mL (ref >3.0)
Hematocrit: 39.2 % (ref 34.0–46.6)
Hemoglobin: 12.8 g/dL (ref 11.1–15.9)
Immature Grans (Abs): 0 10*3/uL (ref 0.0–0.1)
Immature Granulocytes: 1 %
Iron Saturation: 24 % (ref 15–55)
Iron: 94 ug/dL (ref 27–159)
Lymphocytes Absolute: 1.6 10*3/uL (ref 0.7–3.1)
Lymphs: 20 %
MCH: 30.4 pg (ref 26.6–33.0)
MCHC: 32.7 g/dL (ref 31.5–35.7)
MCV: 93 fL (ref 79–97)
Monocytes Absolute: 0.6 10*3/uL (ref 0.1–0.9)
Monocytes: 7 %
Neutrophils Absolute: 5.5 10*3/uL (ref 1.4–7.0)
Neutrophils: 70 %
Platelets: 327 10*3/uL (ref 150–450)
RBC: 4.21 x10E6/uL (ref 3.77–5.28)
RDW: 13.1 % (ref 11.7–15.4)
Retic Ct Pct: 0.9 % (ref 0.6–2.6)
Total Iron Binding Capacity: 397 ug/dL (ref 250–450)
UIBC: 303 ug/dL (ref 131–425)
Vitamin B-12: 553 pg/mL (ref 232–1245)
WBC: 7.8 10*3/uL (ref 3.4–10.8)

## 2023-12-30 LAB — LIPID PANEL
Chol/HDL Ratio: 3 {ratio} (ref 0.0–4.4)
Cholesterol, Total: 147 mg/dL (ref 100–199)
HDL: 49 mg/dL (ref >39)
LDL Chol Calc (NIH): 80 mg/dL (ref 0–99)
Triglycerides: 95 mg/dL (ref 0–149)
VLDL Cholesterol Cal: 18 mg/dL (ref 5–40)

## 2023-12-30 LAB — CMP14+EGFR
ALT: 26 [IU]/L (ref 0–32)
AST: 32 [IU]/L (ref 0–40)
Albumin: 4 g/dL (ref 3.9–4.9)
Alkaline Phosphatase: 125 [IU]/L — ABNORMAL HIGH (ref 44–121)
BUN/Creatinine Ratio: 10 (ref 9–23)
BUN: 9 mg/dL (ref 6–24)
Bilirubin Total: 0.3 mg/dL (ref 0.0–1.2)
CO2: 22 mmol/L (ref 20–29)
Calcium: 9.3 mg/dL (ref 8.7–10.2)
Chloride: 104 mmol/L (ref 96–106)
Creatinine, Ser: 0.86 mg/dL (ref 0.57–1.00)
Globulin, Total: 2.8 g/dL (ref 1.5–4.5)
Glucose: 91 mg/dL (ref 70–99)
Potassium: 5 mmol/L (ref 3.5–5.2)
Sodium: 138 mmol/L (ref 134–144)
Total Protein: 6.8 g/dL (ref 6.0–8.5)
eGFR: 84 mL/min/{1.73_m2} (ref >59)

## 2023-12-30 LAB — FSH/LH
FSH: 3.4 m[IU]/mL
LH: 5 m[IU]/mL

## 2024-01-08 ENCOUNTER — Other Ambulatory Visit: Payer: Self-pay | Admitting: *Deleted

## 2024-01-08 ENCOUNTER — Other Ambulatory Visit: Payer: Medicaid Other

## 2024-01-08 DIAGNOSIS — R7989 Other specified abnormal findings of blood chemistry: Secondary | ICD-10-CM

## 2024-01-09 LAB — THYROID PEROXIDASE ANTIBODY: Thyroperoxidase Ab SerPl-aCnc: 11 [IU]/mL (ref 0–34)

## 2024-01-09 LAB — T3, FREE: T3, Free: 2.8 pg/mL (ref 2.0–4.4)

## 2024-01-24 DIAGNOSIS — Z20822 Contact with and (suspected) exposure to covid-19: Secondary | ICD-10-CM | POA: Diagnosis not present

## 2024-01-24 DIAGNOSIS — R0789 Other chest pain: Secondary | ICD-10-CM | POA: Diagnosis not present

## 2024-01-24 DIAGNOSIS — J101 Influenza due to other identified influenza virus with other respiratory manifestations: Secondary | ICD-10-CM | POA: Diagnosis not present

## 2024-01-24 DIAGNOSIS — R051 Acute cough: Secondary | ICD-10-CM | POA: Diagnosis not present

## 2024-01-29 ENCOUNTER — Ambulatory Visit: Payer: Medicaid Other | Admitting: Family Medicine

## 2024-02-12 ENCOUNTER — Ambulatory Visit: Payer: Medicaid Other | Admitting: Family Medicine

## 2024-06-29 ENCOUNTER — Telehealth: Payer: Self-pay

## 2024-06-29 ENCOUNTER — Other Ambulatory Visit (HOSPITAL_COMMUNITY): Payer: Self-pay

## 2024-06-29 NOTE — Telephone Encounter (Signed)
 Pharmacy Patient Advocate Encounter  Insurance verification completed.   The patient is insured through [facility]   Ran test claim for QUEtiapine  Fumarate 50MG  tablets . Currently a quantity of 90 is a 90 day supply and the co-pay is 4.00 . No PA needed at this time. SEE TEST BILLING BELOW   This test claim was processed through [facility]- copay amounts may vary at other pharmacies due to pharmacy/plan contracts, or as the patient moves through the different stages of their insurance plan.

## 2024-08-02 ENCOUNTER — Other Ambulatory Visit (HOSPITAL_COMMUNITY): Payer: Self-pay

## 2024-08-18 ENCOUNTER — Other Ambulatory Visit (HOSPITAL_COMMUNITY): Payer: Self-pay

## 2024-08-21 ENCOUNTER — Emergency Department (HOSPITAL_COMMUNITY)
Admission: EM | Admit: 2024-08-21 | Discharge: 2024-08-21 | Disposition: A | Discharge status: Home / Self Care | Attending: Student | Admitting: Student

## 2024-08-21 ENCOUNTER — Emergency Department (HOSPITAL_COMMUNITY)

## 2024-08-21 ENCOUNTER — Other Ambulatory Visit: Payer: Self-pay

## 2024-08-21 ENCOUNTER — Encounter (HOSPITAL_COMMUNITY): Payer: Self-pay | Admitting: Emergency Medicine

## 2024-08-21 DIAGNOSIS — Z87891 Personal history of nicotine dependence: Secondary | ICD-10-CM | POA: Diagnosis not present

## 2024-08-21 DIAGNOSIS — K2940 Chronic atrophic gastritis without bleeding: Secondary | ICD-10-CM | POA: Diagnosis not present

## 2024-08-21 DIAGNOSIS — R079 Chest pain, unspecified: Secondary | ICD-10-CM | POA: Diagnosis not present

## 2024-08-21 DIAGNOSIS — R0789 Other chest pain: Secondary | ICD-10-CM | POA: Insufficient documentation

## 2024-08-21 DIAGNOSIS — K2970 Gastritis, unspecified, without bleeding: Secondary | ICD-10-CM | POA: Diagnosis not present

## 2024-08-21 LAB — BASIC METABOLIC PANEL WITH GFR
Anion gap: 10 (ref 5–15)
BUN: 11 mg/dL (ref 6–20)
CO2: 25 mmol/L (ref 22–32)
Calcium: 9.7 mg/dL (ref 8.9–10.3)
Chloride: 103 mmol/L (ref 98–111)
Creatinine, Ser: 0.62 mg/dL (ref 0.44–1.00)
GFR, Estimated: >60 mL/min (ref >60)
Glucose, Bld: 101 mg/dL — ABNORMAL HIGH (ref 70–99)
Potassium: 4.3 mmol/L (ref 3.5–5.1)
Sodium: 138 mmol/L (ref 135–145)

## 2024-08-21 LAB — CBC
HCT: 41.6 % (ref 36.0–46.0)
Hemoglobin: 13.4 g/dL (ref 12.0–15.0)
MCH: 30.4 pg (ref 26.0–34.0)
MCHC: 32.2 g/dL (ref 30.0–36.0)
MCV: 94.3 fL (ref 80.0–100.0)
Platelets: 347 K/uL (ref 150–400)
RBC: 4.41 MIL/uL (ref 3.87–5.11)
RDW: 14 % (ref 11.5–15.5)
WBC: 7.8 K/uL (ref 4.0–10.5)
nRBC: 0 % (ref 0.0–0.2)

## 2024-08-21 LAB — TROPONIN I (HIGH SENSITIVITY): Troponin I (High Sensitivity): <2 ng/L (ref <18)

## 2024-08-21 MED ORDER — LIDOCAINE VISCOUS HCL 2 % MT SOLN
15.0000 mL | Freq: Once | OROMUCOSAL | Status: AC
  Administered 2024-08-21: 15 mL via ORAL
  Filled 2024-08-21: qty 15

## 2024-08-21 MED ORDER — ALUM & MAG HYDROXIDE-SIMETH 200-200-20 MG/5ML PO SUSP
30.0000 mL | Freq: Once | ORAL | Status: AC
  Administered 2024-08-21: 30 mL via ORAL
  Filled 2024-08-21: qty 30

## 2024-08-21 MED ORDER — MAALOX MAX 400-400-40 MG/5ML PO SUSP: 15.0000 mL | Freq: Four times a day (QID) | ORAL | 0 refills | Status: AC | PRN

## 2024-08-21 NOTE — ED Triage Notes (Signed)
 Pt with left sided chest pain radiating down left arm since last night.  Feels hard to catch her breath sometimes. No nausea.  Pain has been constant.

## 2024-08-22 NOTE — ED Provider Notes (Signed)
 [place] EMERGENCY DEPARTMENT AT [facility] Provider Note  CSN: [id number] Arrival date & time: 08/21/24 1628  Chief Complaint(s) Chest Pain  HPI Laurie Howell is a 48 y.o. female with PMH anemia, gastritis, esophageal stricture, iron  deficiency anemia who presents emerged part for evaluation of chest pain.  States that chest pain began last night.  Left-sided with mild radiation down the left arm.  Pain has been constant with no exertional component of the pain.  No associated shortness of breath, nausea, vomiting, diaphoresis.  Triage note states that patient states it feels like it is hard to catch her breath sometimes but with clarification she states that she is not short of breath at rest or with exertion here in the ER.   Past Medical History Past Medical History:  Diagnosis Date   Anemia    Anovulation 01/23/2017   Chronic gastritis    DUB (dysfunctional uterine bleeding) 06/03/2017   Esophageal stricture    Fibroid 06/18/2017   Frequent UTI    GERD (gastroesophageal reflux disease)    Headache    Infertility of tubal origin 05/01/2015   BTL before age 67 , tubal reanastomosis 03/06/2015 in [place]    Intractable chronic cluster headache 03/02/2020   Iron  deficiency anemia 03/28/2016   due to menorrhagia   LLQ pain 06/03/2017   Menorrhagia with irregular cycle 01/13/2020   Menorrhagia with regular cycle 06/03/2017   Ovarian cyst    Pelvic pain 01/13/2020   Patient Active Problem List   Diagnosis Date Noted   History of UTI 07/19/2020   Irritable bowel syndrome with constipation 05/10/2020   GAD (generalized anxiety disorder) 08/13/2019   Chronic idiopathic constipation 08/13/2019   Insomnia due to medical condition 08/13/2019   Abnormal thyroid  blood test 07/22/2019   Nerve pain 08/12/2017   Abdominal bloating 06/03/2017   Depression, recurrent (HCC) 06/03/2017   Gastroesophageal reflux disease with esophagitis    Iron  deficiency anemia 03/28/2016   Intractable  migraine without aura 01/14/2014   Daily headache 01/14/2014   Home Medication(s) Prior to Admission medications   Medication Sig Start Date End Date Taking? Authorizing Provider  alum & mag hydroxide-simeth (MAALOX MAX) 400-400-40 MG/5ML suspension Take 15 mLs by mouth every 6 (six) hours as needed for indigestion. 08/21/24  Yes Shahla Betsill, MD  acyclovir  ointment (ZOVIRAX ) 5 % Apply 1 Application topically every 3 (three) hours. 06/24/23   Zollie Lowers, MD  DULoxetine  (CYMBALTA ) 60 MG capsule Take 1 capsule (60 mg total) by mouth 2 (two) times daily. 06/24/23   Zollie Lowers, MD  linaclotide  (LINZESS ) 145 MCG CAPS capsule Take 1 capsule (145 mcg total) by mouth daily. To regulate bowel movements 12/29/23   Zollie Lowers, MD  Magnesium  400 MG TABS Take 400 mg by mouth every evening. For restless legs 06/24/23   Zollie Lowers, MD  meclizine  (ANTIVERT ) 50 MG tablet Take 1 tablet (50 mg total) by mouth 3 (three) times daily as needed. 12/29/23   Zollie Lowers, MD  QUEtiapine  (SEROQUEL ) 50 MG tablet Take 1 tablet (50 mg total) by mouth at bedtime. 12/29/23   Zollie Lowers, MD  valACYclovir  (VALTREX ) 500 MG tablet Take 1 tablet (500 mg total) by mouth daily. 12/29/23   Zollie Lowers, MD  Past Surgical History Past Surgical History:  Procedure Laterality Date   ESOPHAGOGASTRODUODENOSCOPY N/A 06/27/2016   Procedure: ESOPHAGOGASTRODUODENOSCOPY (EGD);  Surgeon: Margo LITTIE Haddock, MD;  Location: [facility];  Service: Endoscopy;  Laterality: N/A;  1245   HYSTEROSCOPY N/A 03/22/2020   Procedure: HYSTEROSCOPY WITH MINERVA ENDOMETRIAL ABLATION;  Surgeon: Jayne Vonn DEL, MD;  Location: [facility] ORS;  Service: Gynecology;  Laterality: N/A;   SAVORY DILATION N/A 06/27/2016   Procedure: SAVORY DILATION;  Surgeon: Margo LITTIE Haddock, MD;  Location: [facility];  Service: Endoscopy;   Laterality: N/A;   TUBAL LIGATION  1997   Tubal reversal  02/2015   NCCM - Dr Trellis   Family History Family History  Problem Relation Age of Onset   Depression Mother    Hypertension Mother    Other Mother 70       open heart surgery   Hypertension Father    Anxiety disorder Father    Heart attack Father 73   Peripheral vascular disease Father 94       CEA   Stroke Maternal Grandmother    Colon cancer Neg Hx     Social History Social History   Tobacco Use   Smoking status: Former    Current packs/day: 0.00    Types: Cigarettes    Start date: 12/30/1993    Quit date: 12/30/1994    Years since quitting: 29.6   Smokeless tobacco: Never   Tobacco comments:    2-3 daily  Vaping Use   Vaping status: Never Used  Substance Use Topics   Alcohol use: No   Drug use: No   Allergies Ivp dye [iodinated contrast media], Shellfish allergy, Mucinex  dm [dm-guaifenesin  er], Aspirin, and [employer] instant hand sanitizer [flavoring agent]  Review of Systems Review of Systems  Respiratory:  Positive for chest tightness.   Cardiovascular:  Positive for chest pain.    Physical Exam Vital Signs  I have reviewed the triage vital signs BP 127/80   Pulse 85   Temp 98 F (36.7 C) (Temporal)   Resp 20   SpO2 97%   Physical Exam Vitals and nursing note reviewed.  Constitutional:      General: She is not in acute distress.    Appearance: She is well-developed.  HENT:     Head: Normocephalic and atraumatic.  Eyes:     Conjunctiva/sclera: Conjunctivae normal.  Cardiovascular:     Rate and Rhythm: Normal rate and regular rhythm.     Heart sounds: No murmur heard. Pulmonary:     Effort: Pulmonary effort is normal. No respiratory distress.     Breath sounds: Normal breath sounds.  Abdominal:     Palpations: Abdomen is soft.     Tenderness: There is no abdominal tenderness.  Musculoskeletal:        General: No swelling.     Cervical back: Neck supple.  Skin:    General: Skin is  warm and dry.     Capillary Refill: Capillary refill takes less than 2 seconds.  Neurological:     Mental Status: She is alert.  Psychiatric:        Mood and Affect: Mood normal.     ED Results and Treatments Labs (all labs ordered are listed, but only abnormal results are displayed) Labs Reviewed  BASIC METABOLIC PANEL WITH GFR - Abnormal; Notable for the following components:      Result Value   Glucose, Bld 101 (*)    All other components within normal limits  CBC  TROPONIN I (HIGH SENSITIVITY)                                                                                                                          Radiology DG Chest 2 View Result Date: 08/21/2024 CLINICAL DATA:  Chest pain. Left chest pain radiates down the left arm since last night. Hard to catch breath. EXAM: CHEST - 2 VIEW COMPARISON:  01/24/2024 FINDINGS: The heart size and mediastinal contours are within normal limits. Both lungs are clear. The visualized skeletal structures are unremarkable. IMPRESSION: No active cardiopulmonary disease. Electronically Signed   By: Elsie Gravely M.D.   On: 08/21/2024 17:23    Pertinent labs & imaging results that were available during my care of the patient were reviewed by me and considered in my medical decision making (see MDM for details).  Medications Ordered in ED Medications  alum & mag hydroxide-simeth (MAALOX/MYLANTA) 200-200-20 MG/5ML suspension 30 mL (30 mLs Oral Given 08/21/24 1837)    And  lidocaine  (XYLOCAINE ) 2 % viscous mouth solution 15 mL (15 mLs Oral Given 08/21/24 1837)                                                                                                                                     Procedures Procedures  (including critical care time)  Medical Decision Making / ED Course   This patient presents to the ED for concern of chest pain, this involves an extensive number of treatment options, and is a complaint that carries with it a  high risk of complications and morbidity.  The differential diagnosis includes ACS, Pneumothorax, Pneumonia, Esophageal Rupture, PE, esophageal spasm, dysrhythmia, GERD, costochondritis.  MDM: Patient seen in the emergency room for evaluation of chest pain.  Physical exam is largely unremarkable with normal cardiopulmonary exam.  Laboratory evaluation unremarkable including negative high-sensitivity troponin.  ECG with no significant changes from previous.  Chest x-ray unremarkable.  Patient heart score is low and low risk by Wells criteria and I have low suspicion for ACS or PE at this time.  With known history of gastritis patient given a GI cocktail on reevaluation her symptoms have resolved.  Do suspect esophagitis at this time.  At this time she does not meet inpatient criteria for admission will be discharged with outpatient follow-up.  Strict return precautions given of which she voiced understanding she was discharged  Additional history obtained: -External records from outside source obtained and reviewed including: Chart review including previous notes, labs, imaging, consultation notes   Lab Tests: -I ordered, reviewed, and interpreted labs.   The pertinent results include:   Labs Reviewed  BASIC METABOLIC PANEL WITH GFR - Abnormal; Notable for the following components:      Result Value   Glucose, Bld 101 (*)    All other components within normal limits  CBC  TROPONIN I (HIGH SENSITIVITY)      EKG   EKG Interpretation Date/Time:  Saturday August 21 2024 17:45:03 EDT Ventricular Rate:  81 PR Interval:  107 QRS Duration:  89 QT Interval:  373 QTC Calculation: 433 R Axis:   40  Text Interpretation: Sinus rhythm Short PR interval No significant change since last tracing Confirmed by Winslow Verrill ([id number]) on 08/22/2024 1:43:07 AM         Imaging Studies ordered: I ordered imaging studies including chest x-ray I independently visualized and interpreted imaging. I  agree with the radiologist interpretation   Medicines ordered and prescription drug management: Meds ordered this encounter  Medications   AND Linked Order Group    alum & mag hydroxide-simeth (MAALOX/MYLANTA) 200-200-20 MG/5ML suspension 30 mL    lidocaine  (XYLOCAINE ) 2 % viscous mouth solution 15 mL   alum & mag hydroxide-simeth (MAALOX MAX) 400-400-40 MG/5ML suspension    Sig: Take 15 mLs by mouth every 6 (six) hours as needed for indigestion.    Dispense:  355 mL    Refill:  0    -I have reviewed the patients home medicines and have made adjustments as needed  Critical interventions none    Cardiac Monitoring: The patient was maintained on a cardiac monitor.  I personally viewed and interpreted the cardiac monitored which showed an underlying rhythm of: NSR  Social Determinants of Health:  Factors impacting patients care include: none   Reevaluation: After the interventions noted above, I reevaluated the patient and found that they have :improved  Co morbidities that complicate the patient evaluation  Past Medical History:  Diagnosis Date   Anemia    Anovulation 01/23/2017   Chronic gastritis    DUB (dysfunctional uterine bleeding) 06/03/2017   Esophageal stricture    Fibroid 06/18/2017   Frequent UTI    GERD (gastroesophageal reflux disease)    Headache    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     Final Clinical Impression(s) / ED Diagnoses Final diagnoses:  Atypical chest  pain  Atrophic gastritis without hemorrhage     @PCDICTATION @    Albertina Dixon, MD 08/22/24 [phone]

## 2024-09-24 ENCOUNTER — Other Ambulatory Visit: Payer: Self-pay | Admitting: Family Medicine

## 2024-09-24 DIAGNOSIS — F411 Generalized anxiety disorder: Secondary | ICD-10-CM

## 2024-09-27 ENCOUNTER — Other Ambulatory Visit: Payer: Self-pay | Admitting: Family Medicine

## 2024-09-27 DIAGNOSIS — F411 Generalized anxiety disorder: Secondary | ICD-10-CM

## 2024-10-06 ENCOUNTER — Ambulatory Visit: Admitting: Family Medicine

## 2024-10-06 ENCOUNTER — Encounter: Payer: Self-pay | Admitting: Family Medicine

## 2024-10-06 VITALS — BP 111/76 | HR 98 | Temp 98.3°F | Ht 62.0 in | Wt 141.6 lb

## 2024-10-06 DIAGNOSIS — K5904 Chronic idiopathic constipation: Secondary | ICD-10-CM | POA: Diagnosis not present

## 2024-10-06 DIAGNOSIS — Z23 Encounter for immunization: Secondary | ICD-10-CM

## 2024-10-06 DIAGNOSIS — F339 Major depressive disorder, recurrent, unspecified: Secondary | ICD-10-CM | POA: Diagnosis not present

## 2024-10-06 DIAGNOSIS — G43019 Migraine without aura, intractable, without status migrainosus: Secondary | ICD-10-CM

## 2024-10-06 DIAGNOSIS — F411 Generalized anxiety disorder: Secondary | ICD-10-CM

## 2024-10-06 MED ORDER — SUMATRIPTAN SUCCINATE 50 MG PO TABS: ORAL_TABLET | ORAL | 2 refills | Status: AC

## 2024-10-06 MED ORDER — VALACYCLOVIR HCL 500 MG PO TABS: 500.0000 mg | ORAL_TABLET | Freq: Every day | ORAL | 3 refills | Status: AC

## 2024-10-06 MED ORDER — QUETIAPINE FUMARATE 50 MG PO TABS: 50.0000 mg | ORAL_TABLET | Freq: Every day | ORAL | 3 refills | Status: AC

## 2024-10-06 MED ORDER — DULOXETINE HCL 60 MG PO CPEP: 60.0000 mg | ORAL_CAPSULE | Freq: Two times a day (BID) | ORAL | 1 refills | Status: AC

## 2024-10-06 MED ORDER — LINACLOTIDE 290 MCG PO CAPS: 290.0000 ug | ORAL_CAPSULE | Freq: Every day | ORAL | 1 refills | Status: AC

## 2024-10-06 MED ORDER — LINACLOTIDE 145 MCG PO CAPS: 145.0000 ug | ORAL_CAPSULE | Freq: Every day | ORAL | 5 refills | Status: DC

## 2024-10-06 NOTE — Progress Notes (Signed)
 Subjective:  Patient ID: Laurie Howell, female    DOB: 1976/01/24  Age: 48 y.o. MRN: [id number]  CC: Medical Management of Chronic Issues (Needs t be seen for medication refills.)   HPI  Discussed the use of AI scribe software for clinical note transcription with the patient, who gave verbal consent to proceed.  History of Present Illness Laurie Howell is a 48 year old female who presents with severe headaches and anxiety following the loss of her cousin.  She has been experiencing severe headaches for the past two weeks, described as a pressure in the forehead, sometimes accompanied by blurry vision. These headaches have been debilitating, confining her to bed for three days. Over-the-counter medications like Tylenol  and Advil  have been ineffective in alleviating her symptoms.  She has a history of anxiety and depression and is currently taking duloxetine . Since her cousin's passing in a car accident seven months ago, she has experienced increased anxiety, particularly when driving or hearing ambulances. She reports panic attacks and a persistent fear of car accidents affecting her or her child. She is currently taking duloxetine  and Seroquel  for her mental health conditions. She reports difficulty sleeping and increased nervousness since her cousin's passing.  She takes Linzess  for gastrointestinal issues but reports ongoing stomach problems despite regular use.  She has been taking Valtrex  daily for herpes management since a positive test result in April of the previous year. No recent herpes outbreaks while on Valtrex .       06/24/2023    2:44 PM 04/10/2023    1:43 PM 10/29/2022    2:05 PM 03/12/2022    2:15 PM  GAD 7 : Generalized Anxiety Score  Nervous, Anxious, on Edge 2 0 2 3  Control/stop worrying 3 0 2 3  Worry too much - different things 0 0 2 3  Trouble relaxing 1 0 2 2  Restless 0 0 0 0  Easily annoyed or irritable 2 3 2 3   Afraid - awful might happen 0 0 0 0  Total  GAD 7 Score 8 3 10 14   Anxiety Difficulty Not difficult at all Not difficult at all Not difficult at all Somewhat difficult          12/29/2023    1:07 PM 06/24/2023    2:43 PM 06/24/2023    2:38 PM  Depression screen PHQ 2/9  Decreased Interest 0 1 0  Down, Depressed, Hopeless 0 0 0  PHQ - 2 Score 0 1 0  Altered sleeping  3   Tired, decreased energy  3   Change in appetite  0   Feeling bad or failure about yourself   0   Trouble concentrating  3   Moving slowly or fidgety/restless  0   Suicidal thoughts  0   PHQ-9 Score  10   Difficult doing work/chores  Somewhat difficult     History Laurie Howell has a past medical history of Anemia, Anovulation (01/23/2017), Chronic gastritis, DUB (dysfunctional uterine bleeding) (06/03/2017), Esophageal stricture, Fibroid (06/18/2017), Frequent UTI, GERD (gastroesophageal reflux disease), Headache, 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   She has a past surgical history that includes Tubal ligation (1997); Tubal reversal (02/2015); Esophagogastroduodenoscopy (N/A, 06/27/2016); Savory dilation (N/A, 06/27/2016); and Hysteroscopy (N/A, 03/22/2020).   Her family history includes Anxiety disorder in her father; Depression in her mother;  Heart attack (age of onset: 25) in her father; Hypertension in her father and mother; Other (age of onset: 74) in her mother; Peripheral vascular disease (age of onset: 74) in her father; Stroke in her maternal grandmother.She reports that she quit smoking about 29 years ago. Her smoking use included cigarettes. She started smoking about 30 years ago. She has never used smokeless tobacco. She reports that she does not drink alcohol and does not use drugs.    ROS Review of Systems   Constitutional: Negative.   HENT:  Negative for congestion.   Eyes:  Negative for visual disturbance.  Respiratory:  Negative for shortness of breath.   Cardiovascular:  Negative for chest pain.  Gastrointestinal:  Positive for abdominal pain and constipation. Negative for diarrhea, nausea and vomiting.  Genitourinary:  Negative for difficulty urinating.  Musculoskeletal:  Negative for arthralgias and myalgias.  Neurological:  Positive for headaches.  Psychiatric/Behavioral:  Negative for sleep disturbance. The patient is nervous/anxious.     Objective:  BP 111/76   Pulse 98   Temp 98.3 F (36.8 C)   Ht 5' 2 (1.575 m)   Wt 141 lb 9.6 oz (64.2 kg)   SpO2 98%   BMI 25.90 kg/m   BP Readings from Last 3 Encounters:  10/06/24 111/76  08/21/24 127/80  12/29/23 124/77    Wt Readings from Last 3 Encounters:  10/06/24 141 lb 9.6 oz (64.2 kg)  12/29/23 142 lb 9.6 oz (64.7 kg)  08/21/23 140 lb 6.4 oz (63.7 kg)     Physical Exam Physical Exam GENERAL: Alert, cooperative, well developed, no acute distress HEENT: Normocephalic, normal oropharynx, moist mucous membranes CHEST: Clear to auscultation bilaterally, No wheezes, rhonchi, or crackles CARDIOVASCULAR: Normal heart rate and rhythm, S1 and S2 normal without murmurs ABDOMEN: Soft, non-tender, non-distended, without organomegaly, Normal bowel sounds EXTREMITIES: No cyanosis or edema NEUROLOGICAL: Cranial nerves grossly intact, Moves all extremities without gross motor or sensory deficit   Assessment & Plan:  Encounter for immunization -     Flu vaccine trivalent PF, 6mos and older(Flulaval,Afluria,Fluarix,Fluzone)  GAD (generalized anxiety disorder) -     DULoxetine  HCl; Take 1 capsule (60 mg total) by mouth 2 (two) times daily.  Dispense: 180 capsule; Refill: 1 -     QUEtiapine  Fumarate; Take 1 tablet (50 mg total) by mouth at bedtime.  Dispense: 90 tablet; Refill: 3  Depression, recurrent  Intractable migraine  without aura and without status migrainosus  Chronic idiopathic constipation  Other orders -     valACYclovir  HCl; Take 1 tablet (500 mg total) by mouth daily.  Dispense: 90 tablet; Refill: 3 -     SUMAtriptan  Succinate; Take one at onset. May repeat in 2 hours if headache persists or recurs. Limit 2 per 24 hours  Dispense: 10 tablet; Refill: 2 -     linaCLOtide ; Take 1 capsule (290 mcg total) by mouth daily. To regulate bowel movements  Dispense: 90 capsule; Refill: 1    Assessment and Plan Assessment & Plan Generalized anxiety disorder and grief reaction   She is experiencing increased anxiety and grief after losing a cousin in a car accident, with panic attacks, nervousness, and fear, particularly related to car travel. Duloxetine  is somewhat effective. Recommend grief counseling and provide information on local resources, including [facility] and [facility] for Medicaid coverage. Continue duloxetine  as prescribed. Encourage support from her pastor.  Recurrent severe headache (migraine)   She reports severe headaches with pressure in the forehead,  sometimes causing blurred vision and vomiting. A recent episode required bed rest for three days. Over-the-counter medications like Tylenol  and Advil  are ineffective. Prescribe migraine medication to manage severe headache episodes.  Chronic constipation   She experiences ongoing constipation despite regular use of Linzess . Increase Linzess  to the next higher dose.  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 Continue daily Valtrex  to prevent outbreaks and reduce transmission risk.       Follow-up: Return in about 6 months (around 04/06/2025).  Butler Der, M.D.

## 2024-10-06 NOTE — Patient Instructions (Signed)
Your provider wants you to schedule an appointment with a Psychologist/Psychiatrist. The following list of offices requires the patient to call and make their own appointment, as there is information they need that only you can provide. Please feel free to choose form the following providers:  [facility]   [phone] Crisis Recovery in [place] [phone]  [facility]  [phone]   [address] [place], [place]  (Scheduled through [facility]) Must call and do an interview for appointment. Sees Children / Accepts Medicaid  [facility]    [phone]  [address], [address]    [place], [place]       [facility]  [phone] [address] [place], [place]  Evaluates for Autism but does not treat it Sees Children / Accepts Medicaid  [facility]    [phone] [address], [address]   [place], [place] Medication management, substance abuse, bipolar, grief, family, marriage, OCD, anxiety, PTSD Sees children / Accepts Medicaid  [facility]    [phone] [address] [place], [place] Sees children / Accepts Medicaid  [facility]  [phone] [address] [place], [place]   Dr Akinlayo     [phone] [address] [place], [place]  Sees ADD & ADHD for treatment Accepts Medicaid  [facility]  [phone] [address] [place], [place] Evaluates for Autism Accepts Medicaid  [facility]  [phone] [address] [place], [place]  Does Adult ADD evaluations Does not accept Medicaid  [facility]   [phone] [address]   , [place] Uses animal therapy  Sees children as young as 3 years old Accepts Medicaid  [facility]     [phone]    [address]  [place],  [address] Sees children Accepts Medicaid  

## 2024-10-07 ENCOUNTER — Telehealth: Payer: Self-pay

## 2024-10-07 ENCOUNTER — Other Ambulatory Visit (HOSPITAL_COMMUNITY): Payer: Self-pay

## 2024-10-07 ENCOUNTER — Encounter (HOSPITAL_COMMUNITY): Payer: Self-pay | Admitting: Hematology & Oncology

## 2024-10-07 NOTE — Telephone Encounter (Signed)
 Pharmacy Patient Advocate Encounter   Received notification from CoverMyMeds that prior authorization for QUEtiapine  Fumarate 50MG  tablets is required/requested.   Insurance verification completed.   The patient is insured through [employer].   Per test claim: The current 30 day co-pay is, $4.00 CALLED PHARMACY AND GOT THEM TO GET THEM TO [facility] CODE IN [place] No PA needed at this time.   PLEASE BE ADVISE TO SEE OTHER ENCOUNTER FROM 06/2024 MARK PRIOR AUTH.

## 2024-11-03 ENCOUNTER — Other Ambulatory Visit (HOSPITAL_COMMUNITY): Payer: Self-pay

## 2024-12-01 ENCOUNTER — Other Ambulatory Visit (HOSPITAL_COMMUNITY): Payer: Self-pay

## 2025-04-06 ENCOUNTER — Ambulatory Visit: Payer: Self-pay | Admitting: Family Medicine
